# Patient Record
Sex: Female | Born: 1935 | Race: White | Hispanic: No | Smoking: Former smoker
Health system: Southern US, Community
[De-identification: ages and names within clinical notes are randomized; demographics above are authoritative.]

## PROBLEM LIST (undated history)

## (undated) DIAGNOSIS — A048 Other specified bacterial intestinal infections: Secondary | ICD-10-CM

## (undated) DIAGNOSIS — H9190 Unspecified hearing loss, unspecified ear: Secondary | ICD-10-CM

## (undated) DIAGNOSIS — I10 Essential (primary) hypertension: Secondary | ICD-10-CM

## (undated) DIAGNOSIS — E039 Hypothyroidism, unspecified: Secondary | ICD-10-CM

## (undated) DIAGNOSIS — E785 Hyperlipidemia, unspecified: Secondary | ICD-10-CM

## (undated) HISTORY — PX: THYROIDECTOMY: SHX17

## (undated) HISTORY — DX: Hypothyroidism, unspecified: E03.9

## (undated) HISTORY — DX: Unspecified hearing loss, unspecified ear: H91.90

## (undated) HISTORY — DX: Other specified bacterial intestinal infections: A04.8

## (undated) HISTORY — PX: APPENDECTOMY: SHX54

## (undated) HISTORY — PX: CHOLECYSTECTOMY: SHX55

## (undated) HISTORY — DX: Essential (primary) hypertension: I10

## (undated) HISTORY — DX: Hyperlipidemia, unspecified: E78.5

---

## 2007-08-26 LAB — HM MAMMOGRAPHY: HM Mammogram: NORMAL

## 2008-03-27 ENCOUNTER — Ambulatory Visit: Payer: Self-pay | Admitting: Family Medicine

## 2008-03-27 DIAGNOSIS — E785 Hyperlipidemia, unspecified: Secondary | ICD-10-CM | POA: Insufficient documentation

## 2008-03-27 DIAGNOSIS — I10 Essential (primary) hypertension: Secondary | ICD-10-CM | POA: Insufficient documentation

## 2008-04-30 ENCOUNTER — Ambulatory Visit: Payer: Self-pay | Admitting: Family Medicine

## 2008-04-30 DIAGNOSIS — E039 Hypothyroidism, unspecified: Secondary | ICD-10-CM | POA: Insufficient documentation

## 2008-05-01 LAB — CONVERTED CEMR LAB
ALT: 15 units/L (ref 0–35)
AST: 16 units/L (ref 0–37)
Albumin: 4.4 g/dL (ref 3.5–5.2)
Alkaline Phosphatase: 83 units/L (ref 39–117)
Calcium: 10.2 mg/dL (ref 8.4–10.5)
Chloride: 99 meq/L (ref 96–112)
Potassium: 4.9 meq/L (ref 3.5–5.3)

## 2008-05-02 ENCOUNTER — Encounter: Payer: Self-pay | Admitting: Family Medicine

## 2008-05-03 LAB — CONVERTED CEMR LAB
HDL: 68 mg/dL (ref >39)
LDL Cholesterol: 72 mg/dL (ref 0–99)
VLDL: 22 mg/dL (ref 0–40)

## 2008-05-18 ENCOUNTER — Telehealth: Payer: Self-pay | Admitting: Family Medicine

## 2008-08-01 ENCOUNTER — Ambulatory Visit: Payer: Self-pay | Admitting: Family Medicine

## 2008-08-02 ENCOUNTER — Encounter: Payer: Self-pay | Admitting: Family Medicine

## 2008-08-02 LAB — CONVERTED CEMR LAB
CO2: 27 meq/L (ref 19–32)
Calcium: 9.7 mg/dL (ref 8.4–10.5)
Chloride: 101 meq/L (ref 96–112)
Sodium: 137 meq/L (ref 135–145)

## 2008-08-20 ENCOUNTER — Telehealth: Payer: Self-pay | Admitting: Family Medicine

## 2008-08-22 ENCOUNTER — Telehealth: Payer: Self-pay | Admitting: Family Medicine

## 2008-08-27 ENCOUNTER — Telehealth: Payer: Self-pay | Admitting: Family Medicine

## 2008-10-08 ENCOUNTER — Encounter: Payer: Self-pay | Admitting: Family Medicine

## 2008-10-08 ENCOUNTER — Telehealth (INDEPENDENT_AMBULATORY_CARE_PROVIDER_SITE_OTHER): Payer: Self-pay | Admitting: *Deleted

## 2008-10-09 LAB — CONVERTED CEMR LAB: TSH: 2.602 u[IU]/mL

## 2009-02-05 ENCOUNTER — Ambulatory Visit: Payer: Self-pay | Admitting: Family Medicine

## 2009-02-05 DIAGNOSIS — F438 Other reactions to severe stress: Secondary | ICD-10-CM

## 2009-02-06 LAB — CONVERTED CEMR LAB
AST: 19 units/L (ref 0–37)
Albumin: 4.4 g/dL (ref 3.5–5.2)
Alkaline Phosphatase: 85 units/L (ref 39–117)
BUN: 16 mg/dL (ref 6–23)
Calcium: 10.2 mg/dL (ref 8.4–10.5)
Chloride: 98 meq/L (ref 96–112)
Cholesterol, target level: 200 mg/dL
HDL goal, serum: 40 mg/dL
HDL: 61 mg/dL (ref >39)
LDL Cholesterol: 133 mg/dL — ABNORMAL HIGH (ref 0–99)
LDL Goal: 160 mg/dL
Potassium: 4.5 meq/L (ref 3.5–5.3)
Sodium: 135 meq/L (ref 135–145)
TSH: 1.344 microintl units/mL (ref 0.350–4.500)
Total Protein: 7.2 g/dL (ref 6.0–8.3)

## 2009-07-08 ENCOUNTER — Ambulatory Visit: Payer: Self-pay | Admitting: Family Medicine

## 2009-07-09 LAB — CONVERTED CEMR LAB
ALT: 17 units/L (ref 0–35)
AST: 16 units/L (ref 0–37)
Alkaline Phosphatase: 95 units/L (ref 39–117)
CO2: 25 meq/L (ref 19–32)
Creatinine, Ser: 0.86 mg/dL (ref 0.40–1.20)
Sodium: 138 meq/L (ref 135–145)
TSH: 4.908 microintl units/mL — ABNORMAL HIGH (ref 0.350–4.500)
Total Bilirubin: 0.9 mg/dL (ref 0.3–1.2)
Total Protein: 7.2 g/dL (ref 6.0–8.3)

## 2010-01-06 ENCOUNTER — Ambulatory Visit: Payer: Self-pay | Admitting: Family Medicine

## 2010-01-07 LAB — CONVERTED CEMR LAB
CO2: 24 meq/L (ref 19–32)
Calcium: 10.3 mg/dL (ref 8.4–10.5)
Chloride: 100 meq/L (ref 96–112)
Creatinine, Ser: 0.96 mg/dL (ref 0.40–1.20)
Glucose, Bld: 94 mg/dL (ref 70–99)

## 2010-07-08 ENCOUNTER — Ambulatory Visit: Payer: Self-pay | Admitting: Family Medicine

## 2010-07-09 LAB — CONVERTED CEMR LAB
AST: 19 units/L (ref 0–37)
Albumin: 4.5 g/dL (ref 3.5–5.2)
Alkaline Phosphatase: 82 units/L (ref 39–117)
BUN: 19 mg/dL (ref 6–23)
Calcium: 10 mg/dL (ref 8.4–10.5)
Creatinine, Ser: 0.85 mg/dL (ref 0.40–1.20)
Glucose, Bld: 100 mg/dL — ABNORMAL HIGH (ref 70–99)
HDL: 56 mg/dL (ref >39)
LDL Cholesterol: 129 mg/dL — ABNORMAL HIGH (ref 0–99)
Potassium: 4.3 meq/L (ref 3.5–5.3)
Total CHOL/HDL Ratio: 3.9
Triglycerides: 179 mg/dL — ABNORMAL HIGH (ref <150)

## 2010-09-02 NOTE — Assessment & Plan Note (Signed)
Summary: f/u HTN/ thyroid   Vital Signs:  Patient profile:   77 year old female Height:      59 inches Weight:      124 pounds BMI:     25.14 O2 Sat:      100 % on Room air Pulse rate:   60 / minute BP sitting:   130 / 70  (right arm) Cuff size:   regular  Vitals Entered By: Payton Spark CMA (January 06, 2010 9:33 AM)  O2 Flow:  Room air CC: F/U HTN and TSH   Primary Care Provider:  Nani Gasser MD  CC:  F/U HTN and TSH.  History of Present Illness: 77 yo WF pt of Dr Linford Arnold seen today for f/u HTN and hypothyroidism.  She is overdue for her TSH as her thyroid med was increased 6 mos ago and she failed to have a 2 month TSH checked.   She denies fatigue, leg edema or constipation.  She denies CP or DOE.  She is due for RFs today.  Current Medications (verified): 1)  Lisinopril-Hydrochlorothiazide 20-25 Mg Tabs (Lisinopril-Hydrochlorothiazide) .... Take 1 Tablet By Mouth Once A Day 2)  Micro-K 10 Meq Cr-Caps (Potassium Chloride) .... Take 1 Tablet By Mouth Once A Day 3)  Levothroid 88 Mcg Tabs (Levothyroxine Sodium) .... Take 1 Tablet By Mouth Once A Day in The Am 4)  Amlodipine Besylate 5 Mg Tabs (Amlodipine Besylate) .... Take One Tablet By Mouth Once A Day 5)  Atenolol 100 Mg Tabs (Atenolol) .... Take One and Half Tablet By Mouth Once A Day 6)  Alprazolam 1 Mg Tabs (Alprazolam) .... Take 1-2 Tablet By Mouth Once A Day As Needed For Anxiety 7)  Ranitidine Hcl 150 Mg Tabs (Ranitidine Hcl) .... Take 1 Tab By Mouth By Mouth Two Times A Day  Allergies (verified): 1)  ! Simvastatin  Past History:  Past Medical History: Reviewed history from 08/01/2008 and no changes required. Current Problems:  HYPOTHYROIDISM (ICD-244.9) HYPERLIPIDEMIA (ICD-272.4) HYPERTENSION, BENIGN (ICD-401.1)  Hx of H.pylori - treated.  Hard of hearing.   Past Surgical History: Reviewed history from 04/30/2008 and no changes required. Thyroidectomy, parital for goiter.   Social  History: Reviewed history from 03/27/2008 and no changes required. Housewife. Widowed.  Lives alone.  Former Smoker Alcohol use-no Drug use-no Regular exercise-yes  Review of Systems      See HPI  Physical Exam  General:  alert, well-developed, well-nourished, and well-hydrated.   Head:  normocephalic and atraumatic.   Eyes:  bilat clouding of the anterior chambers Mouth:  pharynx pink and moist and poor dentition.   Neck:  no masses.  no carotid bruits Lungs:  Normal respiratory effort, chest expands symmetrically. Lungs are clear to auscultation, no crackles or wheezes. Heart:  Normal rate and regular rhythm. S1 and S2 normal without gallop, murmur, click, rub or other extra sounds. Extremities:  no LE edema Skin:  color normal.   Psych:  good eye contact, not anxious appearing, and not depressed appearing.     Impression & Recommendations:  Problem # 1:  HYPOTHYROIDISM (ICD-244.9) Recheck TSH today.  Adjust medication tomorrow and will [facility] Her updated medication list for this problem includes:    Levothroid 88 Mcg Tabs (Levothyroxine sodium) .Marland Kitchen... Take 1 tablet by mouth once a day in the am  Labs Reviewed: TSH: 4.908 (07/08/2009)    Chol: 224 (02/05/2009)   HDL: 61 (02/05/2009)   LDL: 133 (02/05/2009)   TG:  149 (02/05/2009)  Problem # 2:  HYPERTENSION, BENIGN (ICD-401.1) AT goal on current meds.  Due only for BMP today.  RFs sent. Her updated medication list for this problem includes:    Lisinopril-hydrochlorothiazide 20-25 Mg Tabs (Lisinopril-hydrochlorothiazide) .Marland Kitchen... Take 1 tablet by mouth once a day    Amlodipine Besylate 5 Mg Tabs (Amlodipine besylate) .Marland Kitchen... Take one tablet by mouth once a day    Atenolol 100 Mg Tabs (Atenolol) .Marland Kitchen... Take one and half tablet by mouth once a day  Orders: T-Basic Metabolic Panel [phone])  BP today: 130/70 Prior BP: 125/70 (07/08/2009)  Prior 10 Yr Risk Heart Disease: 9 % (02/06/2009)  Labs Reviewed: K+:  4.6 (07/08/2009) Creat: : 0.86 (07/08/2009)   Chol: 224 (02/05/2009)   HDL: 61 (02/05/2009)   LDL: 133 (02/05/2009)   TG: 149 (02/05/2009)  Complete Medication List: 1)  Lisinopril-hydrochlorothiazide 20-25 Mg Tabs (Lisinopril-hydrochlorothiazide) .... Take 1 tablet by mouth once a day 2)  Micro-k 10 Meq Cr-caps (Potassium chloride) .... Take 1 tablet by mouth once a day 3)  Levothroid 88 Mcg Tabs (Levothyroxine sodium) .... Take 1 tablet by mouth once a day in the am 4)  Amlodipine Besylate 5 Mg Tabs (Amlodipine besylate) .... Take one tablet by mouth once a day 5)  Atenolol 100 Mg Tabs (Atenolol) .... Take one and half tablet by mouth once a day 6)  Alprazolam 1 Mg Tabs (Alprazolam) .... Take 1-2 tablet by mouth once a day as needed for anxiety 7)  Ranitidine Hcl 150 Mg Tabs (Ranitidine hcl) .... Take 1 tab by mouth by mouth two times a day  Other Orders: T-TSH ([id number])  Patient Instructions: 1)  Meds RFd below. 2)  Will call you w/ lab results tomorrow and I will fill your THYROID medicine tomorrow. 3)  BP looks good. 4)  Return to see Dr Linford Arnold in 6 mos, sooner if needed. Prescriptions: LISINOPRIL-HYDROCHLOROTHIAZIDE 20-25 MG TABS (LISINOPRIL-HYDROCHLOROTHIAZIDE) Take 1 tablet by mouth once a day  #90 Each x 2   Entered and Authorized by:   Seymour Bars DO   Signed by:   Seymour Bars DO on 01/06/2010   Method used:   Electronically to        [facility]* (mail-order)             ,          Ph: [phone]       Fax: [phone]   RxID:   [id number] RANITIDINE HCL 150 MG TABS (RANITIDINE HCL) Take 1 tab by mouth by mouth two times a day  #180 x 1   Entered by:   Payton Spark CMA   Authorized by:   Seymour Bars DO   Signed by:   Seymour Bars DO on 01/06/2010   Method used:   Electronically to        [facility]* (mail-order)             ,          Ph: [phone]       Fax: [phone]   RxID:   [id number] MICRO-K 10 MEQ CR-CAPS (POTASSIUM CHLORIDE)  Take 1 tablet by mouth once a day  #90 x 2   Entered by:   Payton Spark CMA   Authorized by:   Seymour Bars DO   Signed by:   Seymour Bars DO on 01/06/2010   Method used:   Electronically to        [facility]* (mail-order)             ,  Ph: [phone]       Fax: [phone]   RxID:   [id number] ATENOLOL 100 MG TABS (ATENOLOL) Take one and half tablet by mouth once a day  #135 x 2   Entered by:   Payton Spark CMA   Authorized by:   Seymour Bars DO   Signed by:   Seymour Bars DO on 01/06/2010   Method used:   Electronically to        [facility]* (mail-order)             ,          Ph: [phone]       Fax: [phone]   RxID:   [id number] AMLODIPINE BESYLATE 5 MG TABS (AMLODIPINE BESYLATE) Take one tablet by mouth once a day  #90 x 2   Entered by:   Payton Spark CMA   Authorized by:   Seymour Bars DO   Signed by:   Seymour Bars DO on 01/06/2010   Method used:   Electronically to        [facility]* (mail-order)             ,          Ph: [phone]       Fax: [phone]   RxID:   [id number]

## 2010-09-02 NOTE — Assessment & Plan Note (Signed)
Summary: 6 MONTH FU hypertension, thyroid   Vital Signs:  Patient profile:   77 year old female Height:      59 inches Weight:      123 pounds Pulse rate:   70 / minute BP sitting:   150 / 76  (right arm) Cuff size:   regular  Vitals Entered By: Avon Gully CMA, Duncan Dull) (July 08, 2010 8:47 AM)  Serial Vital Signs/Assessments:  Time      Position  BP       Pulse  Resp  Temp     By 9:30 AM             132/71                         Avon Gully CMA, (AAMA)  CC: f/u BP and thyroid, Hypertension Management   Primary Care Provider:  Nani Gasser MD  CC:  f/u BP and thyroid and Hypertension Management.  History of Present Illness: Daughter died 3 week ago from cancer. She feels she is dealing with it OK. She declined therapy/counseling services.  She is here to f/u her BP and recheck her thyrod. This is her 6 mof/u.   Hypertension History:      She denies headache, chest pain, palpitations, dyspnea with exertion, orthopnea, PND, peripheral edema, visual symptoms, neurologic problems, syncope, and side effects from treatment.  She notes no problems with any antihypertensive medication side effects.        Positive major cardiovascular risk factors include female age 69 years old or older, hyperlipidemia, and hypertension.  Negative major cardiovascular risk factors include no history of diabetes, negative family history for ischemic heart disease, and non-tobacco-user status.        Further assessment for target organ damage reveals no history of ASHD, stroke/TIA, or peripheral vascular disease.     Current Medications (verified): 1)  Lisinopril-Hydrochlorothiazide 20-25 Mg Tabs (Lisinopril-Hydrochlorothiazide) .... Take 1 Tablet By Mouth Once A Day 2)  Micro-K 10 Meq Cr-Caps (Potassium Chloride) .... Take 1 Tablet By Mouth Once A Day 3)  Levothroid 88 Mcg Tabs (Levothyroxine Sodium) .... Take 1 Tablet By Mouth Once A Day in The Am 4)  Amlodipine Besylate 5 Mg Tabs  (Amlodipine Besylate) .... Take One Tablet By Mouth Once A Day 5)  Atenolol 100 Mg Tabs (Atenolol) .... Take One and Half Tablet By Mouth Once A Day 6)  Alprazolam 1 Mg Tabs (Alprazolam) .... Take 1-2 Tablet By Mouth Once A Day As Needed For Anxiety 7)  Ranitidine Hcl 150 Mg Tabs (Ranitidine Hcl) .... Take 1 Tab By Mouth By Mouth Two Times A Day  Allergies (verified): 1)  ! Simvastatin  Comments:  Nurse/Medical Assistant: The patient's medications and allergies were reviewed with the patient and were updated in the Medication and Allergy Lists. Avon Gully CMA, Duncan Dull) (July 08, 2010 8:47 AM)  Physical Exam  General:  Well-developed,well-nourished,in no acute distress; alert,appropriate and cooperative throughout examination Head:  Normocephalic and atraumatic without obvious abnormalities. No apparent alopecia or balding. Eyes:  No corneal or conjunctival inflammation noted. EOMI. Perrla.  Ears:  External ear exam shows no significant lesions or deformities.  Otoscopic examination reveals clear canals, tympanic membranes are intact bilaterally without bulging, retraction, inflammation or discharge. Hearing is grossly normal bilaterally. Nose:  External nasal examination shows no deformity or inflammation. Nasal mucosa are pink and moist without lesions or exudates. Mouth:  Oral mucosa and oropharynx without  lesions or exudates.  Teeth in good repair. Neck:  No deformities, masses, or tenderness noted. Lungs:  Normal respiratory effort, chest expands symmetrically. Lungs are clear to auscultation, no crackles or wheezes. Heart:  Normal rate and regular rhythm. S1 and S2 normal without gallop, murmur, click, rub or other extra sounds. No carotid or abdominal bruits.  Skin:  no rashes.   Cervical Nodes:  No lymphadenopathy noted Psych:  Cognition and judgment appear intact. Alert and cooperative with normal attention span and concentration. No apparent delusions, illusions,  hallucinations   Impression & Recommendations:  Problem # 1:  HYPERTENSION, BENIGN (ICD-401.1) Up some today. She feels it is stress related since her daughter died 3 weeks ago from cancer that she had been battling for 3 years. Her recheck was at goal.  F/U in 6 months. Will send refils on her medication.  Her updated medication list for this problem includes:    Lisinopril-hydrochlorothiazide 20-25 Mg Tabs (Lisinopril-hydrochlorothiazide) .Marland Kitchen... Take 1 tablet by mouth once a day    Amlodipine Besylate 5 Mg Tabs (Amlodipine besylate) .Marland Kitchen... Take one tablet by mouth once a day    Atenolol 100 Mg Tabs (Atenolol) .Marland Kitchen... Take one and half tablet by mouth once a day  Orders: T-Comprehensive Metabolic Panel ([id number])  Problem # 2:  HYPOTHYROIDISM (ICD-244.9) She has noticed some excess weight loss but not sure if form depression for from her thyroid.   Her updated medication list for this problem includes:    Levothroid 88 Mcg Tabs (Levothyroxine sodium) .Marland Kitchen... Take 1 tablet by mouth once a day in the am  Orders: T-TSH ([id number])  Complete Medication List: 1)  Lisinopril-hydrochlorothiazide 20-25 Mg Tabs (Lisinopril-hydrochlorothiazide) .... Take 1 tablet by mouth once a day 2)  Micro-k 10 Meq Cr-caps (Potassium chloride) .... Take 1 tablet by mouth once a day 3)  Levothroid 88 Mcg Tabs (Levothyroxine sodium) .... Take 1 tablet by mouth once a day in the am 4)  Amlodipine Besylate 5 Mg Tabs (Amlodipine besylate) .... Take one tablet by mouth once a day 5)  Atenolol 100 Mg Tabs (Atenolol) .... Take one and half tablet by mouth once a day 6)  Alprazolam 1 Mg Tabs (Alprazolam) .... Take 1-2 tablet by mouth once a day as needed for anxiety 7)  Ranitidine Hcl 150 Mg Tabs (Ranitidine hcl) .... Take 1 tab by mouth by mouth two times a day  Other Orders: T-Lipid Profile ([id number]) Influenza Vaccine MCR (47425)  Hypertension Assessment/Plan:      The patient's hypertensive risk group is  category B: At least one risk factor (excluding diabetes) with no target organ damage.  Her calculated 10 year risk of coronary heart disease is 9 %.  Today's blood pressure is 150/76.  Her blood pressure goal is < 140/90.  Patient Instructions: 1)  Can schedule an annual wellness exam in 6 months .  2)  We will call you for your lab results.  Prescriptions: RANITIDINE HCL 150 MG TABS (RANITIDINE HCL) Take 1 tab by mouth by mouth two times a day  #180 x 2   Entered and Authorized by:   Nani Gasser MD   Signed by:   Nani Gasser MD on 07/08/2010   Method used:   Electronically to        [facility]* (retail)             ,          Ph: [phone]       Fax: [phone]  RxID:   [id number] ATENOLOL 100 MG TABS (ATENOLOL) Take one and half tablet by mouth once a day  #135 x 2   Entered and Authorized by:   Nani Gasser MD   Signed by:   Nani Gasser MD on 07/08/2010   Method used:   Electronically to        [facility]* (retail)             ,          Ph: [phone]       Fax: [phone]   RxID:   [id number] AMLODIPINE BESYLATE 5 MG TABS (AMLODIPINE BESYLATE) Take one tablet by mouth once a day  #90 x 2   Entered and Authorized by:   Nani Gasser MD   Signed by:   Nani Gasser MD on 07/08/2010   Method used:   Electronically to        [facility]* (retail)             ,          Ph: [phone]       Fax: [phone]   RxID:   [id number] MICRO-K 10 MEQ CR-CAPS (POTASSIUM CHLORIDE) Take 1 tablet by mouth once a day  #90 x 2   Entered and Authorized by:   Nani Gasser MD   Signed by:   Nani Gasser MD on 07/08/2010   Method used:   Electronically to        [facility]* (retail)             ,          Ph: [phone]       Fax: [phone]   RxID:   [id number] LISINOPRIL-HYDROCHLOROTHIAZIDE 20-25 MG TABS (LISINOPRIL-HYDROCHLOROTHIAZIDE) Take 1 tablet by mouth once a day  #90 Each x 2    Entered and Authorized by:   Nani Gasser MD   Signed by:   Nani Gasser MD on 07/08/2010   Method used:   Electronically to        [facility]* (retail)             ,          Ph: [phone]       Fax: [phone]   RxID:   [id number]    Orders Added: 1)  T-Comprehensive Metabolic Panel [[id number]] 2)  T-Lipid Profile [[id number]] 3)  T-TSH [[id number]] 4)  Influenza Vaccine MCR [00025] 5)  Est. Patient Level III [97673]   Immunizations Administered:  Influenza Vaccine # 1:    Vaccine Type: Fluvax MCR    Site: right deltoid    Mfr: GlaxoSmithKline    Dose: 0.5 ml    Route: IM    Given by: Sue Lush McCrimmon CMA, (AAMA)    Exp. Date: 01/31/2011    Lot #: [id number]    VIS given: 02/25/10 version given July 08, 2010.  Flu Vaccine Consent Questions:    Do you have a history of severe allergic reactions to this vaccine? no    Any prior history of allergic reactions to egg and/or gelatin? no    Do you have a sensitivity to the preservative Thimersol? no    Do you have a past history of Guillan-Barre Syndrome? no    Do you currently have an acute febrile illness? no    Have you ever had a severe reaction to latex? no    Vaccine information given and explained to patient? no    Are  you currently pregnant? no   Immunizations Administered:  Influenza Vaccine # 1:    Vaccine Type: Fluvax MCR    Site: right deltoid    Mfr: GlaxoSmithKline    Dose: 0.5 ml    Route: IM    Given by: Sue Lush McCrimmon CMA, (AAMA)    Exp. Date: 01/31/2011    Lot #: [id number]    VIS given: 02/25/10 version given July 08, 2010.

## 2010-09-11 ENCOUNTER — Telehealth: Payer: Self-pay | Admitting: Family Medicine

## 2010-09-18 NOTE — Progress Notes (Signed)
Summary: [facility]-Levothroid rx  Phone Note Call from Patient Call back at [facility] Phone [phone]   Summary of Call: levothroid not at pharmacy per patient.  I spoke with pharmacy and they have rx on hold as pt did not pick up in December.  They will fill for pt and have ready today. Initial call taken by: Francee Piccolo CMA Duncan Dull),  September 11, 2010 11:58 AM     Appended Document: [facility]-Levothroid rx [facility] from pt, meds need to be sent to [facility] not local pharmacy.  RX sent to [facility].  Pt notified.  Rx cancelled at [facility] per Cesar Chavez.  Francee Piccolo CMA Duncan Dull)  September 11, 2010 3:58 PM  Clinical Lists Changes  Prescriptions: LEVOTHROID 37 MCG TABS (LEVOTHYROXINE SODIUM) Take 1 tablet by mouth once a day in the AM  #90 x 1   Entered by:   Francee Piccolo CMA (AAMA)   Authorized by:   Nani Gasser MD   Signed by:   Francee Piccolo CMA (AAMA) on 09/11/2010   Method used:   Electronically to        [facility]* (retail)             ,          Ph: [phone]       Fax: [phone]   RxID:   [id number]

## 2011-01-04 ENCOUNTER — Encounter: Payer: Self-pay | Admitting: Family Medicine

## 2011-01-08 ENCOUNTER — Other Ambulatory Visit: Payer: Self-pay | Admitting: Family Medicine

## 2011-01-08 ENCOUNTER — Ambulatory Visit (INDEPENDENT_AMBULATORY_CARE_PROVIDER_SITE_OTHER): Payer: Medicare Other | Admitting: Family Medicine

## 2011-01-08 ENCOUNTER — Encounter: Payer: Self-pay | Admitting: Family Medicine

## 2011-01-08 DIAGNOSIS — Z136 Encounter for screening for cardiovascular disorders: Secondary | ICD-10-CM

## 2011-01-08 DIAGNOSIS — Z1231 Encounter for screening mammogram for malignant neoplasm of breast: Secondary | ICD-10-CM

## 2011-01-08 DIAGNOSIS — I10 Essential (primary) hypertension: Secondary | ICD-10-CM

## 2011-01-08 DIAGNOSIS — Z780 Asymptomatic menopausal state: Secondary | ICD-10-CM

## 2011-01-08 DIAGNOSIS — E785 Hyperlipidemia, unspecified: Secondary | ICD-10-CM

## 2011-01-08 DIAGNOSIS — E039 Hypothyroidism, unspecified: Secondary | ICD-10-CM

## 2011-01-08 DIAGNOSIS — Z0000 Encounter for general adult medical examination without abnormal findings: Secondary | ICD-10-CM

## 2011-01-08 DIAGNOSIS — R9431 Abnormal electrocardiogram [ECG] [EKG]: Secondary | ICD-10-CM

## 2011-01-08 LAB — LIPID PANEL
HDL: 62 mg/dL (ref >39)
Total CHOL/HDL Ratio: 3.7 Ratio
VLDL: 22 mg/dL (ref 0–40)

## 2011-01-08 LAB — HEPATIC FUNCTION PANEL
Alkaline Phosphatase: 90 U/L (ref 39–117)
Bilirubin, Direct: 0.2 mg/dL (ref 0.0–0.3)
Indirect Bilirubin: 1 mg/dL — ABNORMAL HIGH (ref 0.0–0.9)
Total Bilirubin: 1.2 mg/dL (ref 0.3–1.2)
Total Protein: 7.3 g/dL (ref 6.0–8.3)

## 2011-01-08 LAB — TSH: TSH: 2.52 u[IU]/mL (ref 0.350–4.500)

## 2011-01-08 MED ORDER — AMBULATORY NON FORMULARY MEDICATION: Status: DC

## 2011-01-08 MED ORDER — LEVOTHYROXINE SODIUM 88 MCG PO TABS: 88.0000 ug | ORAL_TABLET | Freq: Every day | ORAL | Status: DC

## 2011-01-08 MED ORDER — AMLODIPINE BESYLATE 5 MG PO TABS: 5.0000 mg | ORAL_TABLET | Freq: Every day | ORAL | Status: DC

## 2011-01-08 MED ORDER — LISINOPRIL-HYDROCHLOROTHIAZIDE 20-25 MG PO TABS: 1.0000 | ORAL_TABLET | Freq: Every day | ORAL | Status: DC

## 2011-01-08 MED ORDER — RANITIDINE HCL 150 MG PO TABS: 150.0000 mg | ORAL_TABLET | Freq: Two times a day (BID) | ORAL | Status: DC

## 2011-01-08 MED ORDER — ATENOLOL 100 MG PO TABS: 100.0000 mg | ORAL_TABLET | Freq: Every day | ORAL | Status: DC

## 2011-01-08 MED ORDER — POTASSIUM CHLORIDE ER 10 MEQ PO CPCR: 10.0000 meq | ORAL_CAPSULE | Freq: Every day | ORAL | Status: DC

## 2011-01-08 NOTE — Progress Notes (Signed)
Subjective:    Tina Graham is a 77 y.o. female who presents for Medicare Annual/Subsequent preventive examination.  Preventive Screening-Counseling & Management  Tobacco History  Smoking status  . Former Smoker  Smokeless tobacco  . Not on file     Problems Prior to Visit 1.   Current Problems (verified) Patient Active Problem List  Diagnoses  . HYPOTHYROIDISM  . HYPERLIPIDEMIA  . OTHER ACUTE REACTIONS TO STRESS  . HYPERTENSION, BENIGN    Medications Prior to Visit Current Outpatient Prescriptions on File Prior to Visit  Medication Sig Dispense Refill  . ALPRAZolam (XANAX) 1 MG tablet Take 1 mg by mouth at bedtime as needed.        Marland Kitchen amLODipine (NORVASC) 5 MG tablet Take 5 mg by mouth daily.        Marland Kitchen atenolol (TENORMIN) 100 MG tablet Take by mouth. Take one and half tablets by mouth once daily.       Marland Kitchen levothyroxine (LEVOTHROID) 88 MCG tablet Take 88 mcg by mouth daily.        Marland Kitchen lisinopril-hydrochlorothiazide (PRINZIDE,ZESTORETIC) 20-25 MG per tablet Take 1 tablet by mouth daily.        . Omega-3 Fatty Acids (FISH OIL) 1000 MG CAPS Take 2,000 mg by mouth daily.        . potassium chloride (MICRO-K) 10 MEQ CR capsule Take 10 mEq by mouth daily.        . Ranitidine & Diet Manage Prod (RANITIDINE-NUTRITIONAL SUPL) 150 MG MISC Take 150 mg by mouth 2 (two) times daily.          Current Medications (verified) Current Outpatient Prescriptions  Medication Sig Dispense Refill  . ALPRAZolam (XANAX) 1 MG tablet Take 1 mg by mouth at bedtime as needed.        Marland Kitchen amLODipine (NORVASC) 5 MG tablet Take 5 mg by mouth daily.        Marland Kitchen atenolol (TENORMIN) 100 MG tablet Take by mouth. Take one and half tablets by mouth once daily.       Marland Kitchen levothyroxine (LEVOTHROID) 88 MCG tablet Take 88 mcg by mouth daily.        Marland Kitchen lisinopril-hydrochlorothiazide (PRINZIDE,ZESTORETIC) 20-25 MG per tablet Take 1 tablet by mouth daily.        . Omega-3 Fatty Acids (FISH OIL) 1000 MG CAPS Take 2,000 mg by  mouth daily.        . potassium chloride (MICRO-K) 10 MEQ CR capsule Take 10 mEq by mouth daily.        . Ranitidine & Diet Manage Prod (RANITIDINE-NUTRITIONAL SUPL) 150 MG MISC Take 150 mg by mouth 2 (two) times daily.           Allergies (verified) Simvastatin   PAST HISTORY  Family History Family History  Problem Relation Age of Onset  . Cancer Daughter     Social History History  Substance Use Topics  . Smoking status: Former Games developer  . Smokeless tobacco: Not on file  . Alcohol Use: No     Are there smokers in your home (other than you)? No  Risk Factors Current exercise habits: Home exercise routine includes walking 30 minutes hrs per days.  Dietary issues discussed: fish, chx, veggies, fruit.    Cardiac risk factors: advanced age (older than 48 for men, 49 for women). HTN, hyperlipidema.   Depression Screen (Note: if answer to either of the following is "Yes", a more complete depression screening is indicated)   Over the past two weeks,  have you felt down, depressed or hopeless? No  Over the past two weeks, have you felt little interest or pleasure in doing things? No  Have you lost interest or pleasure in daily life? No  Do you often feel hopeless? No  Do you cry easily over simple problems? No  Activities of Daily Living In your present state of health, do you have any difficulty performing the following activities?:  Driving? Yes Managing money?  Yes Feeding yourself? Yes Getting from bed to chair? Timoteo Ace gets up on exam table.  Climbing a flight of stairs? Yes Preparing food and eating?: Yes Bathing or showering? Yes Getting dressed: Yes Getting to the toilet? Yes Using the toilet:Yes Moving around from place to place: Yes In the past year have you fallen or had a near fall?:Yes   Are you sexually active?  No  Do you have more than one partner?  No  Hearing Difficulties: Yes Do you often ask people to speak up or repeat themselves? Yes Do you  experience ringing or noises in your ears? Yes Do you have difficulty understanding soft or whispered voices? Yes   Do you feel that you have a problem with memory? Yes  Do you often misplace items? No  Do you feel safe at home?  Yes  Cognitive Testing  Alert? Yes  Normal Appearance?Yes  Oriented to person? Yes  Place? Yes   Time? Yes  Recall of three objects?  Yes  Can perform simple calculations? Yes  Displays appropriate judgment?Yes  Can read the correct time from a watch face?Yes   Advanced Directives have been discussed with the patient? Yes  List the Names of Other Physician/Practitioners you currently use: 1.    Indicate any recent Medical Services you may have received from other than [facility] providers in the past year (date may be approximate).  Immunization History  Administered Date(s) Administered  . Influenza Whole 04/30/2008, 07/08/2009, 07/08/2010  . Pneumococcal Polysaccharide 08/03/2005  . Td 04/17/2005    Screening Tests Health Maintenance  Topic Date Due  . Colonoscopy  10/09/1980  . Zostavax  10/10/1990  . Influenza Vaccine  05/04/2011  . Tetanus/tdap  04/18/2015  . Pneumococcal Polysaccharide Vaccine Age 69 And Over  Completed    All answers were reviewed with the patient and necessary referrals were made:  Vona Whiters, MD   01/08/2011   History reviewed: allergies, current medications, past family history, past medical history, past social history and past surgical history  Review of Systems Pertinent items are noted in HPI.    Objective:     Vision by Snellen chart: right eye:20/70, left eye:20/70  Body mass index is 23.83 kg/(m^2). BP 123/76  Pulse 62  Ht 5' (1.524 m)  Wt 122 lb (55.339 kg)  BMI 23.83 kg/m2  BP 123/76  Pulse 62  Ht 5' (1.524 m)  Wt 122 lb (55.339 kg)  BMI 23.83 kg/m2  General Appearance:    Alert, cooperative, no distress, appears stated age  Head:    Normocephalic, without obvious abnormality, atraumatic    Eyes:    PERRL, conjunctiva/corneas clear, EOM's intact    , both eyes  Ears:    Normal TM's and external ear canals, both ears  Nose:   Nares normal, septum midline, mucosa normal, no drainage    or sinus tenderness  Throat:   Lips, mucosa, and tongue normal; gums normal  Neck:   Supple, symmetrical, trachea midline, no adenopathy;    thyroid:  no enlargement/tenderness/nodules; no  carotid   bruit  Back:     Symmetric, mild kyphosis, ROM normal, no CVA tenderness  Lungs:     Clear to auscultation bilaterally, respirations unlabored  Chest Wall:    No tenderness or deformity   Heart:    Regular rate and rhythm, S1 and S2 normal, no murmur, rub   or gallop  Breast Exam:    No tenderness, masses, or nipple abnormality  Abdomen:     Soft, non-tender, bowel sounds active all four quadrants,    no masses, no organomegaly  Genitalia:    N/A  Rectal:    N/A  Extremities:   Extremities normal, atraumatic, no cyanosis or edema  Pulses:   2+ and symmetric all extremities  Skin:   Skin color, texture, turgor normal, no rashes or lesions  Lymph nodes:   Cervical, supraclavicular, and axillary nodes normal  Neurologic:   CNII-XII intact, normal strength, reflexes    throughout       Assessment:    Healthy 77 yo WF.  Inc cardiac risk so EKG performed today/ EKG shows borderline bradycardia at rate of 58.   Patient Active Problem List  Diagnoses  . HYPOTHYROIDISM  . HYPERLIPIDEMIA  . OTHER ACUTE REACTIONS TO STRESS  . HYPERTENSION, BENIGN       Plan:     During the course of the visit the patient was educated and counseled about appropriate screening and preventive services including:    Screening mammography  Bone densitometry screening  Diabetes screening  Shingles vaccine  Recommend she make an appt with her eye doctor who she has not seen in one year.   Abnormal EKG. I looked at in my old records and could not find an old EKG to compare to. 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 I think I will schedule her with cardiology for further evaluation she is not currently having any chest pain or shortness of breath.  Diet review for nutrition referral? Yes ____  Not Indicated ____X   Patient Instructions (the written plan) was given to the patient.  Medicare Attestation I have personally reviewed: The patient's medical and social history Their use of alcohol, tobacco or illicit drugs Their current medications and supplements The patient's functional ability including ADLs,fall risks, home safety risks, cognitive, and hearing and visual impairment Diet and physical activities Evidence for depression or mood disorders  The patient's weight, height, BMI, and visual acuity have been recorded in the chart.  I have made referrals, counseling, and provided education to the patient based on review of the above and I have provided the patient with a written personalized care plan for preventive services.     Wesleigh Markovic, MD   01/08/2011

## 2011-01-09 ENCOUNTER — Telehealth: Payer: Self-pay | Admitting: Family Medicine

## 2011-01-09 LAB — BASIC METABOLIC PANEL WITH GFR
BUN: 19 mg/dL (ref 6–23)
CO2: 27 mEq/L (ref 19–32)
Chloride: 100 mEq/L (ref 96–112)
GFR, Est Non African American: 50 mL/min — ABNORMAL LOW (ref >60)
Glucose, Bld: 101 mg/dL — ABNORMAL HIGH (ref 70–99)
Potassium: 4.7 mEq/L (ref 3.5–5.3)

## 2011-01-09 NOTE — Telephone Encounter (Signed)
Tried to call pt with lab results, but NA after several rings.  Will try again on Monday. [name], LPN [facility]

## 2011-01-09 NOTE — Telephone Encounter (Signed)
Call patient: Thyroid and liver are normal. Kidney function is slightly elevated from last time so like to recheck this in 6-8 weeks. Cholesterol is also up a little bit from last time. LDL is 145. Blood sugar is borderline as well but this is stable from last time. Continue work on exercise and diet and see if we can get the LDL back down a little bit like it was before. Let's recheck in 6 months.

## 2011-01-12 NOTE — Telephone Encounter (Signed)
Pt aware of the above  

## 2011-01-13 ENCOUNTER — Other Ambulatory Visit: Payer: Self-pay | Admitting: Family Medicine

## 2011-01-13 MED ORDER — ATENOLOL 100 MG PO TABS: 150.0000 mg | ORAL_TABLET | Freq: Every day | ORAL | Status: DC

## 2011-01-27 ENCOUNTER — Ambulatory Visit
Admission: RE | Admit: 2011-01-27 | Discharge: 2011-01-27 | Disposition: A | Discharge status: Home / Self Care | Payer: Medicare Other | Source: Ambulatory Visit | Attending: Family Medicine | Admitting: Family Medicine

## 2011-01-27 DIAGNOSIS — Z780 Asymptomatic menopausal state: Secondary | ICD-10-CM

## 2011-01-27 DIAGNOSIS — Z1231 Encounter for screening mammogram for malignant neoplasm of breast: Secondary | ICD-10-CM

## 2011-01-28 ENCOUNTER — Telehealth: Payer: Self-pay | Admitting: Family Medicine

## 2011-01-28 NOTE — Telephone Encounter (Signed)
Pls let pt know that her bone density test is + for osteoporosis (thin bones).   I'd recommend her setting up appt with Dr Linford Arnold in the next month to discuss treatment options.

## 2011-01-29 NOTE — Telephone Encounter (Signed)
Pt advised of results and rec. Asked about her mammy-advised pt they are still awaiting prior films for comparison. Pt to ck on.

## 2011-02-06 ENCOUNTER — Encounter: Payer: Self-pay | Admitting: Family Medicine

## 2011-02-06 ENCOUNTER — Ambulatory Visit (INDEPENDENT_AMBULATORY_CARE_PROVIDER_SITE_OTHER): Payer: Medicare Other | Admitting: Family Medicine

## 2011-02-06 DIAGNOSIS — J069 Acute upper respiratory infection, unspecified: Secondary | ICD-10-CM

## 2011-02-06 DIAGNOSIS — N289 Disorder of kidney and ureter, unspecified: Secondary | ICD-10-CM

## 2011-02-06 DIAGNOSIS — M810 Age-related osteoporosis without current pathological fracture: Secondary | ICD-10-CM

## 2011-02-06 MED ORDER — ALENDRONATE SODIUM 70 MG PO TABS: 70.0000 mg | ORAL_TABLET | ORAL | Status: DC

## 2011-02-06 MED ORDER — GUAIFENESIN-CODEINE 100-10 MG/5ML PO SYRP: 5.0000 mL | ORAL_SOLUTION | Freq: Three times a day (TID) | ORAL | Status: DC | PRN

## 2011-02-06 NOTE — Assessment & Plan Note (Addendum)
This is a new diagnosis and we discussed what it means. Will check her vitamin d level today to make sure that she is at goal. We also discussed the importance of getting adequate amount of calcium in her diet. I recommend a 1200 mg daily. We also discussed the importance of adding a bisphosphonate to help protect her from hip and spinal fractures. We will start alendronate. She can call she has any concerns or problems such as heartburn on the medication. Otherwise will repeat her bone density test in 2 years.

## 2011-02-06 NOTE — Progress Notes (Signed)
  Subjective:    Patient ID: Tina Graham, female    DOB: 06-13-1929, 77 y.o.   MRN: [id number]  HPI Cough for one week. Productive.  Occ nasal congesiton.  No ear pain or pressure. No fever.  No cough meds.  Would like a refill on cheratussin as well.  Some SOB with cough.  No ST.  new GI symptoms  Osteoporosis - Here to review her results of her bone density test. Her T score was -2.8..  Notes she had some pain in front of her thighs bilaterally for a few days and wonders if this could be related. She feels better today.  She does take a multivitamin which has 500 mg of calcium and 400 international units of vitamin D. She says she doesn't necessarily take it every day. I believe there is a positive family history of osteoporosis.   Review of Systems     Objective:   Physical Exam  Constitutional: She is oriented to person, place, and time. She appears well-developed and well-nourished.  HENT:  Head: Normocephalic and atraumatic.  Right Ear: External ear normal.  Left Ear: External ear normal.  Nose: Nose normal.  Mouth/Throat: Oropharynx is clear and moist.       TMs and canals are clear.   Eyes: Conjunctivae and EOM are normal. Pupils are equal, round, and reactive to light.  Neck: Neck supple. No thyromegaly present.  Cardiovascular: Normal rate, regular rhythm and normal heart sounds.   Pulmonary/Chest: Effort normal and breath sounds normal. She has no wheezes.  Lymphadenopathy:    She has no cervical adenopathy.  Neurological: She is alert and oriented to person, place, and time.  Skin: Skin is warm and dry.  Psychiatric: She has a normal mood and affect.          Assessment & Plan:  URI-I gave her prescription for cough medication. If she is not feeling better by Monday she can call the office and we'll consider a trial of antibiotic treatment for possible bronchitis. Otherwise symptomatic care is reasonable.  Abnormal renal function-4 and to recheck her kidney  function to make sure that is stable. She may have chronic kidney disease stage III.

## 2011-02-06 NOTE — Patient Instructions (Addendum)
Can take your multivitamin in the morning and a calcium600 (or 500mg ) with vitamin D in the evening Or if you don't take your multivitamin that day then take your calcium w/ vitamin D twice a day.

## 2011-02-07 LAB — BASIC METABOLIC PANEL WITH GFR
CO2: 25 mEq/L (ref 19–32)
Chloride: 99 mEq/L (ref 96–112)
GFR, Est Non African American: 57 mL/min — ABNORMAL LOW (ref >60)
Potassium: 4.4 mEq/L (ref 3.5–5.3)
Sodium: 136 mEq/L (ref 135–145)

## 2011-02-07 LAB — VITAMIN D 25 HYDROXY (VIT D DEFICIENCY, FRACTURES): Vit D, 25-Hydroxy: 31 ng/mL (ref 30–89)

## 2011-02-08 ENCOUNTER — Telehealth: Payer: Self-pay | Admitting: Family Medicine

## 2011-02-08 NOTE — Telephone Encounter (Signed)
Please call patient. Normal mammogram.  Repeat in 1 year.  

## 2011-02-09 ENCOUNTER — Telehealth: Payer: Self-pay | Admitting: Family Medicine

## 2011-02-09 NOTE — Telephone Encounter (Signed)
Advised pt of results and rec,

## 2011-02-09 NOTE — Telephone Encounter (Signed)
Call patient: Kidney function actually looks a little bit better than it did last time, therefore it is stable. We'll recheck her level in 6 months. Vitamin D is borderline low. I recommend that she start 800 international units once daily.

## 2011-02-09 NOTE — Telephone Encounter (Signed)
Advised pt of results.

## 2011-02-16 ENCOUNTER — Telehealth: Payer: Self-pay | Admitting: Family Medicine

## 2011-02-16 NOTE — Telephone Encounter (Signed)
Ok if doesn't want to take.  Make sure taking her calcium and vitamin D at least.

## 2011-02-16 NOTE — Telephone Encounter (Signed)
Pt called and is concerned about taking the fosomax 70 mg weekly.  Said she has read that this medication has all kind of side effects, and she doesn't want to take it.  She is currently taking some herbs that she has not mentioned to the provider such as:  Vitamin O, energy greens and vitamins, alka seltzer, tums, and ASA prn.  Please advise. Plan:  Routed to Dr. Marlyne Beards, LPN [facility]

## 2011-02-17 NOTE — Telephone Encounter (Signed)
Pt. Notified that if she doesn't want to take that it will be fine.  Pt now has decided she will take since she spoke with her granddaughter who is a Engineer, civil (consulting).  She already has the medication and will start the fosomax 70 mg weekly this upcoming Saturday.  Will call if she has any problems. [name], LPN [facility]

## 2011-02-25 ENCOUNTER — Encounter: Payer: Self-pay | Admitting: Cardiology

## 2011-02-25 ENCOUNTER — Ambulatory Visit (INDEPENDENT_AMBULATORY_CARE_PROVIDER_SITE_OTHER): Payer: Medicare Other | Admitting: Cardiology

## 2011-02-25 DIAGNOSIS — I10 Essential (primary) hypertension: Secondary | ICD-10-CM

## 2011-02-25 DIAGNOSIS — E785 Hyperlipidemia, unspecified: Secondary | ICD-10-CM

## 2011-02-25 DIAGNOSIS — R9431 Abnormal electrocardiogram [ECG] [EKG]: Secondary | ICD-10-CM | POA: Insufficient documentation

## 2011-02-25 NOTE — Progress Notes (Signed)
HPI: 77 year old female with no prior cardiac history for evaluation of abnormal electrocardiogram. Patient recently seen and noted to have a left anterior fascicular block. She denies dyspnea on exertion unless there is extreme activities. There is no orthopnea, PND, pedal edema, palpitations, syncope or chest pain. Because of her abnormal ECG we were asked to further evaluate.  Current Outpatient Prescriptions  Medication Sig Dispense Refill  . alendronate (FOSAMAX) 70 MG tablet Take 1 tablet (70 mg total) by mouth every 7 (seven) days. Take with a full glass of water on an empty stomach.  12 tablet  3  . ALPRAZolam (XANAX) 1 MG tablet Take 1 mg by mouth at bedtime as needed.        . AMBULATORY NON FORMULARY MEDICATION Medication Name: Zostavax singe dose IM x 1  1 vial  0  . amLODipine (NORVASC) 5 MG tablet Take 1 tablet (5 mg total) by mouth daily.  90 tablet  3  . atenolol (TENORMIN) 100 MG tablet Take 1.5 tablets (150 mg total) by mouth daily.  135 tablet  3  . guaiFENesin (ROBITUSSIN) 100 MG/5ML liquid Take 200 mg by mouth 3 (three) times daily as needed.        Marland Kitchen levothyroxine (LEVOTHROID) 88 MCG tablet Take 1 tablet (88 mcg total) by mouth daily.  90 tablet  0  . lisinopril-hydrochlorothiazide (PRINZIDE,ZESTORETIC) 20-25 MG per tablet Take 1 tablet by mouth daily.  90 tablet  3  . Multiple Vitamins-Minerals (CLINICAL NUTRIENTS FOR WOMEN) TABS Take by mouth.        . Omega-3 Fatty Acids (FISH OIL) 1000 MG CAPS Take 2,000 mg by mouth daily.        . potassium chloride (MICRO-K) 10 MEQ CR capsule Take 1 capsule (10 mEq total) by mouth daily.  90 capsule  3  . ranitidine (ZANTAC) 150 MG tablet Take 1 tablet (150 mg total) by mouth 2 (two) times daily.  180 tablet  3    Allergies  Allergen Reactions  . Simvastatin     REACTION: myalgias    Past Medical History  Diagnosis Date  . Hypothyroidism   . Hyperlipidemia   . Hypertension   . H. pylori infection     treated  . HOH (hard of  hearing)     Past Surgical History  Procedure Date  . Thyroidectomy     partial for goiter  . Cholecystectomy   . Appendectomy     History   Social History  . Marital Status: Widowed    Spouse Name: N/A    Number of Children: 5  . Years of Education: N/A   Occupational History  .     Social History Main Topics  . Smoking status: Former Games developer  . Smokeless tobacco: Not on file  . Alcohol Use: No  . Drug Use: No  . Sexually Active:      housewife, widowed, lives alone, regular exercise   Other Topics Concern  . Not on file   Social History Narrative  . No narrative on file    Family History  Problem Relation Age of Onset  . Cancer Daughter   . Heart disease Sister     unknown specifics    ROS: arthralgias but no fevers or chills, productive cough, hemoptysis, dysphasia, odynophagia, melena, hematochezia, dysuria, hematuria, rash, seizure activity, orthopnea, PND, pedal edema, claudication. Remaining systems are negative.  Physical Exam: General:  Well developed/well nourished in NAD Skin warm/dry Patient not depressed No peripheral clubbing Back-normal  HEENT-normal/normal eyelids Neck supple/normal carotid upstroke bilaterally; no bruits; no JVD; no thyromegaly chest - CTA/ normal expansion CV - RRR/normal S1 and S2; no murmurs, rubs or gallops;  PMI nondisplaced Abdomen -NT/ND, no HSM, no mass, + bowel sounds, no bruit 2+ femoral pulses, no bruits Ext-no edema, chords, 2+ DP; varicosities noted Neuro-grossly nonfocal  ECG normal sinus rhythm at a rate of 67. Left anterior fascicular block. No ST changes.

## 2011-02-25 NOTE — Assessment & Plan Note (Signed)
Continue present blood pressure medications. Managed by primary care.

## 2011-02-25 NOTE — Assessment & Plan Note (Signed)
Patient with left anterior fascicular block on electrocardiogram. No other abnormalities noted. No significant chest pain or dyspnea and no history of palpitations or syncope. I do not think further cardiac workup is necessary.

## 2011-02-25 NOTE — Assessment & Plan Note (Signed)
Management per primary care. 

## 2011-03-23 ENCOUNTER — Other Ambulatory Visit: Payer: Self-pay | Admitting: Family Medicine

## 2011-04-03 DIAGNOSIS — I10 Essential (primary) hypertension: Secondary | ICD-10-CM

## 2011-04-07 ENCOUNTER — Encounter: Payer: Self-pay | Admitting: Family Medicine

## 2011-04-22 ENCOUNTER — Encounter: Payer: Self-pay | Admitting: Family Medicine

## 2011-04-22 ENCOUNTER — Ambulatory Visit
Admission: RE | Admit: 2011-04-22 | Discharge: 2011-04-22 | Disposition: A | Discharge status: Home / Self Care | Payer: Medicare Other | Source: Ambulatory Visit | Attending: Family Medicine | Admitting: Family Medicine

## 2011-04-22 ENCOUNTER — Ambulatory Visit (INDEPENDENT_AMBULATORY_CARE_PROVIDER_SITE_OTHER): Payer: Medicare Other | Admitting: Family Medicine

## 2011-04-22 ENCOUNTER — Telehealth: Payer: Self-pay | Admitting: *Deleted

## 2011-04-22 VITALS — BP 144/72 | HR 92 | Wt 121.0 lb

## 2011-04-22 DIAGNOSIS — M25539 Pain in unspecified wrist: Secondary | ICD-10-CM

## 2011-04-22 DIAGNOSIS — Z23 Encounter for immunization: Secondary | ICD-10-CM

## 2011-04-22 MED ORDER — TRAMADOL HCL 50 MG PO TABS: 50.0000 mg | ORAL_TABLET | Freq: Four times a day (QID) | ORAL | Status: AC | PRN

## 2011-04-22 NOTE — Telephone Encounter (Signed)
Message copied by Lanae Crumbly on Wed Apr 22, 2011  1:32 PM ------      Message from: Nani Gasser D      Created: Wed Apr 22, 2011 11:53 AM       No fracture. Please fit with bilat wrist splints to wear at night for th enext 2 weeks.

## 2011-04-22 NOTE — Progress Notes (Signed)
  Subjective:    Patient ID: Tina Graham, female    DOB: January 25, 1929, 77 y.o.   MRN: [id number]  HPI Waxed her car and then started having pain and swelling in her outer wrists about a week later. They were red initially.  Worse at night.  Hurts to use her wrist the next day. Fingers feel stiff and swollen but most pain in the wrist. Has been Advil- helps her pain.  Tried weraing a wrist sleeve but it was too uncomfortable and felt too tight. She then bought ankle sleeves and put them on her wrists and that even felt uncomfortable..    Review of Systems     Objective:   Physical Exam  Constitutional: She is oriented to person, place, and time. She appears well-developed and well-nourished.  Musculoskeletal:       Wrists with normal range of motion though she does have some pain with full flexion. She also has pain with pronation and supination the wrist. Finger strength is 5 over 5. Wrist strength is 5 over 5. She does have some mild erythema and swelling over the lateral part of both wrists. The left wrist has increased warmth. She is also tender over the left lateral wrist but not the right.  Neurological: She is alert and oriented to person, place, and time.          Assessment & Plan:  Bilateral wrist pain-I am most suspicious of a tendinitis but because of her age I would like to get an x-ray to rule out fracture since she does have increased redness and warmth on the left wrist in particular. The x-ray is negative then we will fit her for bilateral cockup splints to wear at night for the next 2 weeks and see if this improves her symptoms. She can use Advil with food and water as long as it does irritate her stomach. I also gave her tramadol to use it she doesn't have to use it much as much until

## 2011-04-22 NOTE — Telephone Encounter (Signed)
Pt notified and came into office and was fitted with splints bilaterally

## 2011-05-05 ENCOUNTER — Telehealth: Payer: Self-pay | Admitting: Family Medicine

## 2011-05-05 DIAGNOSIS — M25539 Pain in unspecified wrist: Secondary | ICD-10-CM

## 2011-05-05 NOTE — Telephone Encounter (Signed)
Pt called and stated she is unable to use the cock up splints to hands.  COuld not sleep in them.  They were place 2 weeks ago and she tried loosening them but that didn't help.  Please advise what to tell the pt. Plan:  Routed to Dr. Marlyne Beards, LPN [facility]

## 2011-05-05 NOTE — Telephone Encounter (Signed)
Ok to sent to ortho/sport med if she is ok with this.

## 2011-05-05 NOTE — Telephone Encounter (Signed)
Pt informed will send to ortho/sports medicine for wrist pain. [name], LPN [facility]

## 2011-05-27 ENCOUNTER — Other Ambulatory Visit: Payer: Self-pay | Admitting: Family Medicine

## 2011-06-12 ENCOUNTER — Encounter: Payer: Self-pay | Admitting: Family Medicine

## 2011-06-12 ENCOUNTER — Ambulatory Visit (INDEPENDENT_AMBULATORY_CARE_PROVIDER_SITE_OTHER): Payer: Medicare Other | Admitting: Family Medicine

## 2011-06-12 VITALS — BP 104/74 | HR 71 | Temp 98.2°F | Ht 60.0 in | Wt 122.0 lb

## 2011-06-12 DIAGNOSIS — J40 Bronchitis, not specified as acute or chronic: Secondary | ICD-10-CM

## 2011-06-12 MED ORDER — GUAIFENESIN-CODEINE 100-10 MG/5ML PO SYRP: 5.0000 mL | ORAL_SOLUTION | Freq: Three times a day (TID) | ORAL | Status: AC | PRN

## 2011-06-12 MED ORDER — AZITHROMYCIN 250 MG PO TABS: ORAL_TABLET | ORAL | Status: AC

## 2011-06-12 NOTE — Patient Instructions (Signed)
Call if not getting better. Can fill the antibiotic if not better after the weekend.

## 2011-06-12 NOTE — Progress Notes (Signed)
  Subjective:    Patient ID: Tina Graham, female    DOB: 10-Jan-1929, 77 y.o.   MRN: [id number]  HPI 6 days ago had a nose bleed and started coughing. Has felt worse the last 2-3 days. Has had chest congsetion. Pulls  Muscle in her right chest last night after coughing.  No fever.  Using Tussin medication.  Also used some rx cough med a few times as well.  No SOB, except with cough.  Mild ST, no ear pain. No HA or sinus pressure. Former smoker.    Review of Systems     Objective:   Physical Exam  Constitutional: She is oriented to person, place, and time. She appears well-developed and well-nourished.  HENT:  Head: Normocephalic and atraumatic.  Right Ear: External ear normal.  Left Ear: External ear normal.  Nose: Nose normal.  Mouth/Throat: Oropharynx is clear and moist.       TMs and canals are clear.   Eyes: Conjunctivae and EOM are normal. Pupils are equal, round, and reactive to light.  Neck: Neck supple. No thyromegaly present.  Cardiovascular: Normal rate, regular rhythm and normal heart sounds.   Pulmonary/Chest: Effort normal and breath sounds normal. She has no wheezes.  Lymphadenopathy:    She has no cervical adenopathy.  Neurological: She is alert and oriented to person, place, and time.  Skin: Skin is warm and dry.  Psychiatric: She has a normal mood and affect.          Assessment & Plan:  Bronchitis - Call if not better in one week. Explained likely viral so try to hold off on th eABX until after the weekend to see if getting better.  Will refill her cough med. If suddenly gets worse then can fill the zpack.  Call if any concners or getting fever or SOB.

## 2011-07-07 ENCOUNTER — Encounter: Payer: Self-pay | Admitting: Family Medicine

## 2011-07-07 ENCOUNTER — Ambulatory Visit (INDEPENDENT_AMBULATORY_CARE_PROVIDER_SITE_OTHER): Payer: Medicare Other | Admitting: Family Medicine

## 2011-07-07 VITALS — BP 113/64 | HR 65 | Wt 121.0 lb

## 2011-07-07 DIAGNOSIS — I10 Essential (primary) hypertension: Secondary | ICD-10-CM

## 2011-07-07 DIAGNOSIS — E039 Hypothyroidism, unspecified: Secondary | ICD-10-CM

## 2011-07-07 DIAGNOSIS — M21619 Bunion of unspecified foot: Secondary | ICD-10-CM

## 2011-07-07 LAB — LIPID PANEL
Cholesterol: 227 mg/dL — ABNORMAL HIGH (ref 0–200)
HDL: 56 mg/dL (ref >39)
Total CHOL/HDL Ratio: 4.1 Ratio

## 2011-07-07 LAB — TSH: TSH: 2.307 u[IU]/mL (ref 0.350–4.500)

## 2011-07-07 NOTE — Progress Notes (Signed)
  Subjective:    Patient ID: Tina Graham, female    DOB: 1929/02/14, 77 y.o.   MRN: [id number]  Hypertension This is a chronic problem. The current episode started more than 1 year ago. The problem is controlled. Pertinent negatives include no blurred vision, chest pain, palpitations or shortness of breath. There are no associated agents to hypertension. Past treatments include ACE inhibitors, diuretics, beta blockers and calcium channel blockers. The current treatment provides significant improvement. There are no compliance problems.   She has been walking for exercise.   Pain on the side of her left foot. Discussed that it got worse after the she worse a new pair of bedroom shoes. She is not interested in surgery.   Review of Systems  Eyes: Negative for blurred vision.  Respiratory: Negative for shortness of breath.   Cardiovascular: Negative for chest pain and palpitations.       Objective:   Physical Exam  Constitutional: She is oriented to person, place, and time. She appears well-developed and well-nourished.  HENT:  Head: Normocephalic and atraumatic.  Cardiovascular: Normal rate, regular rhythm and normal heart sounds.   Pulmonary/Chest: Effort normal and breath sounds normal.  Musculoskeletal: She exhibits no edema.       She has a bunion on both feet but the one on her left feet if more red and inflammed and is more prominent   Neurological: She is alert and oriented to person, place, and time.  Skin: Skin is warm and dry.  Psychiatric: She has a normal mood and affect. Her behavior is normal.          Assessment & Plan:  HTN- BP is low. Will stop the amlodipine.  F.U in 6 weeks to make sure BP is better. Due for lipid panel.   Hypothyroid - She is due for rechecl.   Bunion- Discussed padding and avoiding shoe wear that doesn't have a wide toebox. If not getting better rec refer to Podiatry but she wants to hold off on this.

## 2011-07-07 NOTE — Patient Instructions (Signed)
Stop your amlodipine

## 2011-08-18 ENCOUNTER — Ambulatory Visit (INDEPENDENT_AMBULATORY_CARE_PROVIDER_SITE_OTHER): Payer: Medicare Other | Admitting: Family Medicine

## 2011-08-18 ENCOUNTER — Encounter: Payer: Self-pay | Admitting: Family Medicine

## 2011-08-18 VITALS — BP 157/85 | HR 67 | Temp 97.8°F | Wt 122.0 lb

## 2011-08-18 DIAGNOSIS — L821 Other seborrheic keratosis: Secondary | ICD-10-CM

## 2011-08-18 DIAGNOSIS — L570 Actinic keratosis: Secondary | ICD-10-CM

## 2011-08-18 DIAGNOSIS — E785 Hyperlipidemia, unspecified: Secondary | ICD-10-CM

## 2011-08-18 MED ORDER — LEVOTHYROXINE SODIUM 88 MCG PO TABS: 88.0000 ug | ORAL_TABLET | Freq: Every day | ORAL | Status: DC

## 2011-08-18 MED ORDER — POTASSIUM CHLORIDE ER 10 MEQ PO CPCR: 10.0000 meq | ORAL_CAPSULE | Freq: Every day | ORAL | Status: DC

## 2011-08-18 MED ORDER — AMLODIPINE BESYLATE 2.5 MG PO TABS: 2.5000 mg | ORAL_TABLET | Freq: Every day | ORAL | Status: DC

## 2011-08-18 MED ORDER — LISINOPRIL-HYDROCHLOROTHIAZIDE 20-25 MG PO TABS: 1.0000 | ORAL_TABLET | Freq: Every day | ORAL | Status: DC

## 2011-08-18 MED ORDER — ATENOLOL 100 MG PO TABS: 150.0000 mg | ORAL_TABLET | Freq: Every day | ORAL | Status: DC

## 2011-08-18 NOTE — Progress Notes (Signed)
  Subjective:    Patient ID: Tina Graham, female    DOB: 09-27-1928, 78 y.o.   MRN: [id number]  Hypertension This is a chronic problem. The current episode started more than 1 year ago. The problem is controlled. Pertinent negatives include no chest pain or shortness of breath. There are no associated agents to hypertension. Past treatments include diuretics. There are no compliance problems.    She has 2 lesions on the right side of her neck that she would like me to look at today. She says that her color and very easily irritated and are tender. She denies any bleeding. Just has 2 lesions on her left forehead and temple that she would also like me to look at. As well as one on her scalp.   Review of Systems  Respiratory: Negative for shortness of breath.   Cardiovascular: Negative for chest pain.       Objective:   Physical Exam  Constitutional: She is oriented to person, place, and time. She appears well-developed and well-nourished.  HENT:  Head: Normocephalic and atraumatic.  Cardiovascular: Normal rate, regular rhythm and normal heart sounds.        No carotid bruits.   Pulmonary/Chest: Effort normal and breath sounds normal.  Neurological: She is alert and oriented to person, place, and time.  Skin: Skin is warm and dry.  Psychiatric: She has a normal mood and affect. Her behavior is normal.     She has 2 actinic keratoses on her left forehead and temple. She has a very large half centimeter actinic keratosis that is scaling on her scalp. She has 2 seborrheic keratoses on her right neck near her collarbone.     Assessment & Plan:  HTN- her blood pressure is elevated and not at goal today. At her last visit her blood pressure was low and we had stopped her amlodipine which was a 5 mg dose. Astra restart half a tab and we will send a new prescription for amlodipine 2.5 mg to her mail-order. Followup in 3 months.  Actinic keratoses - cryotherapy performed.  Seborrheic  keratoses-therapy performed.  Cryotherapy Procedure Note  Pre-operative Diagnosis: Actinic keratosis, seborrheic keratosis  Post-operative Diagnosis: Actinic keratosis, seborrheic keratosis  Locations: Right neck near clavicle and left forehead and temple and scalp   Indications: irritated  Anesthesia: not required   Procedure Details  History of allergy to iodine: no. Pacemaker? no.  Patient informed of risks (permanent scarring, infection, light or dark discoloration, bleeding, infection, weakness, numbness and recurrence of the lesion) and benefits of the procedure and verbal informed consent obtained.  The areas are treated with liquid nitrogen therapy, frozen until ice ball extended 0.2 mm beyond lesion, allowed to thaw, and treated again. The patient tolerated procedure well.  The patient was instructed on post-op care, warned that there may be blister formation, redness and pain. Recommend OTC analgesia as needed for pain.  Condition: Stable  Complications: none.  Plan: 1. Instructed to keep the area dry and covered for 24-48h and clean thereafter. 2. Warning signs of infection were reviewed.   3. Recommended that the patient use none as needed for pain.  4. Return in prn .

## 2011-08-18 NOTE — Patient Instructions (Signed)

## 2011-08-26 ENCOUNTER — Other Ambulatory Visit: Payer: Self-pay | Admitting: *Deleted

## 2011-10-20 ENCOUNTER — Other Ambulatory Visit: Payer: Self-pay | Admitting: *Deleted

## 2011-10-20 MED ORDER — AMLODIPINE BESYLATE 2.5 MG PO TABS: 2.5000 mg | ORAL_TABLET | Freq: Every day | ORAL | Status: DC

## 2011-10-20 MED ORDER — POTASSIUM CHLORIDE ER 10 MEQ PO CPCR: 10.0000 meq | ORAL_CAPSULE | Freq: Every day | ORAL | Status: DC

## 2011-11-17 ENCOUNTER — Encounter: Payer: Self-pay | Admitting: Family Medicine

## 2011-11-17 ENCOUNTER — Ambulatory Visit (INDEPENDENT_AMBULATORY_CARE_PROVIDER_SITE_OTHER): Payer: Medicare Other | Admitting: Family Medicine

## 2011-11-17 VITALS — BP 138/78 | HR 68 | Ht 60.0 in | Wt 123.0 lb

## 2011-11-17 DIAGNOSIS — E039 Hypothyroidism, unspecified: Secondary | ICD-10-CM

## 2011-11-17 DIAGNOSIS — E785 Hyperlipidemia, unspecified: Secondary | ICD-10-CM

## 2011-11-17 DIAGNOSIS — Z1331 Encounter for screening for depression: Secondary | ICD-10-CM

## 2011-11-17 DIAGNOSIS — Z9181 History of falling: Secondary | ICD-10-CM

## 2011-11-17 DIAGNOSIS — T300 Burn of unspecified body region, unspecified degree: Secondary | ICD-10-CM

## 2011-11-17 DIAGNOSIS — I10 Essential (primary) hypertension: Secondary | ICD-10-CM

## 2011-11-17 NOTE — Progress Notes (Addendum)
  Subjective:    Patient ID: Tina Graham, female    DOB: Jan 11, 1929, 78 y.o.   MRN: [id number]  HPI HTN - Doing well. No CP or SOB.  Doing well.  Taking meds regularly without any SE.    Hyperlipidemia - Doesn't want to take a chol pill.   She has more lesions on her face and back that she would like me to freeze today. Unfortunately we are out of liquid nitrogen so she will reschedule this.  She also has a rash on her left forearm she would like me to look at. She thinks she may have blunted, steam coming out of her teeth at all. She just wants me to make sure that looks okay. She has been applying a burn spray which she feels is helpful.   Review of Systems     Objective:   Physical Exam  Constitutional: She is oriented to person, place, and time. She appears well-developed and well-nourished.  HENT:  Head: Normocephalic and atraumatic.  Cardiovascular: Normal rate, regular rhythm and normal heart sounds.   Pulmonary/Chest: Effort normal and breath sounds normal.  Neurological: She is alert and oriented to person, place, and time.  Skin: Skin is warm and dry.       It does look like she has either a burn or possibly a contact dermatitis on her left forearm. It is erythematous and has a fading border. There are no blisters or pustules.  Psychiatric: She has a normal mood and affect. Her behavior is normal.          Assessment & Plan:  HTN - controlled. At Goal. F/U in 6 months.   Hyperlipidemia-we'll recheck her lipids today. She refuses to take any type of statin or cluster lowering medication.  AK- as her come back and make an appointment so that we can remove for actinic keratoses and seborrheic her taste is with cryotherapy.  First-degree burn, left forearm-continued symptomatic treatment. Call if she develops any signs of infection. If it appears to be spreading them please call the office.  Hypothyroidism-she is asymptomatic. Recheck TSH today.  Depression  screening-PHQ 9 score of 0, negative for depression.  Fall risk assessment-score of 5, low risk for falls.

## 2011-11-18 LAB — COMPLETE METABOLIC PANEL WITH GFR
AST: 19 U/L (ref 0–37)
Albumin: 4.5 g/dL (ref 3.5–5.2)
Alkaline Phosphatase: 77 U/L (ref 39–117)
BUN: 19 mg/dL (ref 6–23)
GFR, Est Non African American: 70 mL/min
Glucose, Bld: 93 mg/dL (ref 70–99)
Potassium: 4.3 mEq/L (ref 3.5–5.3)
Sodium: 138 mEq/L (ref 135–145)
Total Bilirubin: 0.9 mg/dL (ref 0.3–1.2)

## 2011-11-18 LAB — LIPID PANEL
HDL: 59 mg/dL (ref >39)
Total CHOL/HDL Ratio: 3.9 Ratio
VLDL: 27 mg/dL (ref 0–40)

## 2011-12-10 ENCOUNTER — Ambulatory Visit (INDEPENDENT_AMBULATORY_CARE_PROVIDER_SITE_OTHER): Payer: Medicare Other | Admitting: Family Medicine

## 2011-12-10 ENCOUNTER — Encounter: Payer: Self-pay | Admitting: Family Medicine

## 2011-12-10 VITALS — BP 132/67 | HR 59 | Ht 63.0 in | Wt 122.0 lb

## 2011-12-10 DIAGNOSIS — L570 Actinic keratosis: Secondary | ICD-10-CM

## 2011-12-10 DIAGNOSIS — L820 Inflamed seborrheic keratosis: Secondary | ICD-10-CM

## 2011-12-10 DIAGNOSIS — R42 Dizziness and giddiness: Secondary | ICD-10-CM

## 2011-12-10 DIAGNOSIS — I8390 Asymptomatic varicose veins of unspecified lower extremity: Secondary | ICD-10-CM

## 2011-12-10 MED ORDER — AMLODIPINE BESYLATE 2.5 MG PO TABS: 2.5000 mg | ORAL_TABLET | Freq: Every day | ORAL | Status: DC

## 2011-12-10 MED ORDER — ATENOLOL 100 MG PO TABS: 150.0000 mg | ORAL_TABLET | Freq: Every day | ORAL | Status: DC

## 2011-12-10 MED ORDER — LEVOTHYROXINE SODIUM 88 MCG PO TABS: 88.0000 ug | ORAL_TABLET | Freq: Every day | ORAL | Status: DC

## 2011-12-10 MED ORDER — POTASSIUM CHLORIDE ER 10 MEQ PO CPCR: 10.0000 meq | ORAL_CAPSULE | Freq: Every day | ORAL | Status: DC

## 2011-12-10 MED ORDER — LISINOPRIL-HYDROCHLOROTHIAZIDE 20-25 MG PO TABS: 1.0000 | ORAL_TABLET | Freq: Every day | ORAL | Status: DC

## 2011-12-10 NOTE — Progress Notes (Signed)
Subjective:    Patient ID: Tina Graham, female    DOB: Apr 20, 1929, 78 y.o.   MRN: [id number]  HPI  Bent over last week and then when stood up felt dizzy for a few minutes. Then sat down adn stopped for a few minutes and then stopped..  No pain or ear pressure. No fever or recent colds.  No more dizziness. She is getting fitted for hearing aid and needs a notes saying it is ok.   Skin lesions- Hearing to have them frozen  Thinks hit leg. Has spider veins and varicose veings. She doesn't wear compression stocking. She says has been very tender last couple of days. Sore to walk. She did wear an Ace wrap yesterday which did help relieve some of her symptoms. No fever or signs of infection.   Review of Systems     Objective:   Physical Exam  Constitutional: She is oriented to person, place, and time. She appears well-developed and well-nourished.  HENT:  Head: Normocephalic and atraumatic.  Right Ear: External ear normal.  Left Ear: External ear normal.  Nose: Nose normal.  Mouth/Throat: Oropharynx is clear and moist.       TMs and canals are clear.   Eyes: Conjunctivae and EOM are normal. Pupils are equal, round, and reactive to light.  Neck: Neck supple. No thyromegaly present.  Cardiovascular: Normal rate, regular rhythm and normal heart sounds.   Pulmonary/Chest: Effort normal and breath sounds normal. She has no wheezes.  Lymphadenopathy:    She has no cervical adenopathy.  Neurological: She is alert and oriented to person, place, and time. No cranial nerve deficit.       Negative Dix-Hallpike maneuver. No nystagmus. Normal gait.  Skin: Skin is warm and dry.  Psychiatric: She has a normal mood and affect.          Assessment & Plan:  Dizziness - Episode was a week ago and very brief.  Negative Dix-Hallpike maneuver. Unlikely to be benign positional vertigo. I suspect this may have been related to her blood pressure. Certainly her in nose and throat exam is completely  normal. I think she is fine go ahead and get fitted for hearing aid. She has a constant please let me know. Her blood pressure looks fantastic today. Continue current regimen.  Actinic keratoses-we froze several AK's off of her facial cheeks bilaterally and on her scalp. I froze several seborrheic keratoses on her back and under her breasts.discussed f/u wound care.    Varicose and spider veins-we discussed that the main treatment is actually compression with compression stockings. Patient says she really doesn't want to do this. She can also use an Ace wrap. In fact she said she did that yesterday and it did help her symptoms. I see no evidence of cellulitis erythema or phlebitis   .Cryotherapy Procedure Note    Pre-operative Diagnosis: Actinic keratosis, seborrheic keratoses  Post-operative Diagnosis: Actinic keratosis, seborrheic keratoses  Locations: She has several small AK's on both of her facial cheeks bilaterally. She has one small AK on her left temple and several 6 larger AK's on her scalp on the top of her head. She had about 20 seborrheic keratoses on her back in about 4 under her breasts  Indications: irritation  Anesthesia: not required   Procedure Details  Patient informed of risks (permanent scarring, infection, light or dark discoloration, bleeding, infection, weakness, numbness and recurrence of the lesion) and benefits of the procedure and verbal informed consent obtained.  The areas  are treated with liquid nitrogen therapy, frozen until ice ball extended 2 mm beyond lesion, allowed to thaw, and treated again. The patient tolerated procedure well.  The patient was instructed on post-op care, warned that there may be blister formation, redness and pain. Recommend OTC analgesia as needed for pain.  Condition: Stable  Complications: none.  Plan: 1. Instructed to keep the area dry and covered for 24-48h and clean thereafter. 2. Warning signs of infection were  reviewed.   3. Recommended that the patient use OTC acetaminophen as needed for pain.  4. Return prn .

## 2011-12-10 NOTE — Patient Instructions (Signed)
Keep follow up for October

## 2012-05-18 ENCOUNTER — Ambulatory Visit: Payer: Medicare Other | Admitting: Family Medicine

## 2012-05-27 ENCOUNTER — Ambulatory Visit (INDEPENDENT_AMBULATORY_CARE_PROVIDER_SITE_OTHER): Payer: Medicare Other | Admitting: Physician Assistant

## 2012-05-27 ENCOUNTER — Encounter: Payer: Self-pay | Admitting: Physician Assistant

## 2012-05-27 VITALS — BP 147/82 | HR 69 | Ht 63.0 in | Wt 124.0 lb

## 2012-05-27 DIAGNOSIS — K219 Gastro-esophageal reflux disease without esophagitis: Secondary | ICD-10-CM | POA: Insufficient documentation

## 2012-05-27 DIAGNOSIS — I10 Essential (primary) hypertension: Secondary | ICD-10-CM

## 2012-05-27 NOTE — Progress Notes (Signed)
  Subjective:    Patient ID: Tina Graham, female    DOB: 11/17/1928, 78 y.o.   MRN: [id number]  HPI Patient is a 78 yo female who presents to the clinic to discuss flushing face in the evenings. Went she takes her blood pressure it is always in the 150's. She denies any CP, palpitations, numbness and tingling of arms, weakness of extemities, or vision changes. She has not had any anxiety issues. She does report that she has felt more indigestion lately. She has not been taking her Zantac as directed. She has had some dizziness with standing but only occasional. Has been taking all bP meds as directed.  Review of Systems     Objective:   Physical Exam  Constitutional: She is oriented to person, place, and time. She appears well-developed and well-nourished.  HENT:  Head: Normocephalic and atraumatic.  Cardiovascular: Normal rate, regular rhythm and normal heart sounds.   Pulmonary/Chest: Effort normal and breath sounds normal.  Abdominal: Soft. Bowel sounds are normal.  Neurological: She is alert and oriented to person, place, and time.  Skin: Skin is warm and dry.  Psychiatric: She has a normal mood and affect. Her behavior is normal.          Assessment & Plan:  Hypertension- Reassured patient that BP looks pretty good today we want to see it a little bit higher due to her age. Since she has had some self reported high BP measurements I did increase Norvasc to 5mg . Recheck in 2 weeks. Continue to monitor at home. Call if having any dizziness.    GERD- Encouraged patient to take Zantac as directed. This could be contributing to some of symptoms. Recheck symptoms in 2 weeks. Reminded of foods that worsen acid reflux.

## 2012-05-27 NOTE — Patient Instructions (Addendum)
Take whole of Zantac for indigestion.   Will increase amodopline to 5mg . Recheck in 2 weeks for recheck BP.

## 2012-06-10 ENCOUNTER — Encounter: Payer: Self-pay | Admitting: Family Medicine

## 2012-06-10 ENCOUNTER — Ambulatory Visit (INDEPENDENT_AMBULATORY_CARE_PROVIDER_SITE_OTHER): Payer: Medicare Other | Admitting: Family Medicine

## 2012-06-10 VITALS — BP 140/76 | HR 69 | Ht 59.5 in | Wt 124.0 lb

## 2012-06-10 DIAGNOSIS — G4700 Insomnia, unspecified: Secondary | ICD-10-CM

## 2012-06-10 DIAGNOSIS — R002 Palpitations: Secondary | ICD-10-CM

## 2012-06-10 DIAGNOSIS — I10 Essential (primary) hypertension: Secondary | ICD-10-CM

## 2012-06-10 DIAGNOSIS — R232 Flushing: Secondary | ICD-10-CM

## 2012-06-10 MED ORDER — ATENOLOL 100 MG PO TABS: 150.0000 mg | ORAL_TABLET | Freq: Every day | ORAL | Status: DC

## 2012-06-10 MED ORDER — LEVOTHYROXINE SODIUM 88 MCG PO TABS
88.0000 ug | ORAL_TABLET | Freq: Every day | ORAL | Status: DC
Start: 2012-06-10 — End: 2012-06-15

## 2012-06-10 MED ORDER — LISINOPRIL-HYDROCHLOROTHIAZIDE 20-25 MG PO TABS: 1.0000 | ORAL_TABLET | Freq: Every day | ORAL | Status: DC

## 2012-06-10 MED ORDER — ALPRAZOLAM 1 MG PO TABS: 1.0000 mg | ORAL_TABLET | Freq: Every evening | ORAL | Status: DC | PRN

## 2012-06-10 MED ORDER — POTASSIUM CHLORIDE ER 10 MEQ PO CPCR: 10.0000 meq | ORAL_CAPSULE | Freq: Every day | ORAL | Status: DC

## 2012-06-10 MED ORDER — AMLODIPINE BESYLATE 5 MG PO TABS: 5.0000 mg | ORAL_TABLET | Freq: Every day | ORAL | Status: DC

## 2012-06-10 NOTE — Progress Notes (Signed)
Subjective:    Patient ID: Tina Graham, female    DOB: 05/17/1929, 78 y.o.   MRN: [id number]  HPI HTN- amlodipine was increased 2 weeks ago her last office visit because of some elevated blood pressure readings at home.she wants and if she can come off her potassium.  says her face will flush in the afternoons and thinks it may be from high BP. This started well before she started the amlodipine. She says the flushing will typically start in the mid afternoon and lasts until about mid evening. She's not sure exactly what is causing it. She denies any significant changes in her diet or caffeine intake. No CP.  Occ gets palpitations.  Says will sit and rest after a walk but says this is not new. No headaches. No shortness of breath. She has had some headaches with the episodes. Lab Results  Component Value Date   TSH 1.798 11/17/2011      Review of Systems BP 140/76  Pulse 69  Ht 4' 11.5" (1.511 m)  Wt 124 lb (56.246 kg)  BMI 24.63 kg/m2    Allergies  Allergen Reactions  . Simvastatin     REACTION: myalgias    Past Medical History  Diagnosis Date  . Hypothyroidism   . Hyperlipidemia   . Hypertension   . H. pylori infection     treated  . HOH (hard of hearing)     Past Surgical History  Procedure Date  . Thyroidectomy     partial for goiter  . Cholecystectomy   . Appendectomy     History   Social History  . Marital Status: Widowed    Spouse Name: N/A    Number of Children: 5  . Years of Education: N/A   Occupational History  .     Social History Main Topics  . Smoking status: Former Games developer  . Smokeless tobacco: Not on file  . Alcohol Use: No  . Drug Use: No  . Sexually Active:      Comment: housewife, widowed, lives alone, regular exercise   Other Topics Concern  . Not on file   Social History Narrative  . No narrative on file    Family History  Problem Relation Age of Onset  . Cancer Daughter   . Heart disease Sister     unknown specifics     Outpatient Encounter Prescriptions as of 06/10/2012  Medication Sig Dispense Refill  . ALPRAZolam (XANAX) 1 MG tablet Take 1 tablet (1 mg total) by mouth at bedtime as needed for sleep.  90 tablet  0  . amLODipine (NORVASC) 5 MG tablet Take 1 tablet (5 mg total) by mouth daily.  90 tablet  1  . atenolol (TENORMIN) 100 MG tablet Take 1.5 tablets (150 mg total) by mouth daily.  135 tablet  1  . levothyroxine (SYNTHROID, LEVOTHROID) 88 MCG tablet Take 1 tablet (88 mcg total) by mouth daily.  90 tablet  1  . lisinopril-hydrochlorothiazide (PRINZIDE,ZESTORETIC) 20-25 MG per tablet Take 1 tablet by mouth daily.  90 tablet  1  . Multiple Vitamins-Minerals (CLINICAL NUTRIENTS FOR WOMEN) TABS Take by mouth.        . Omega-3 Fatty Acids (FISH OIL) 1000 MG CAPS Take 2,000 mg by mouth daily.        . potassium chloride (MICRO-K) 10 MEQ CR capsule Take 1 capsule (10 mEq total) by mouth daily.  90 capsule  3  . ranitidine (ZANTAC) 150 MG tablet Take 150 mg by mouth  2 (two) times daily.      . [DISCONTINUED] ALPRAZolam (XANAX) 1 MG tablet Take 1 mg by mouth at bedtime as needed.       . [DISCONTINUED] amLODipine (NORVASC) 2.5 MG tablet Take 1 tablet (2.5 mg total) by mouth daily.  90 tablet  3  . [DISCONTINUED] atenolol (TENORMIN) 100 MG tablet Take 1.5 tablets (150 mg total) by mouth daily.  135 tablet  3  . [DISCONTINUED] levothyroxine (SYNTHROID, LEVOTHROID) 88 MCG tablet Take 1 tablet (88 mcg total) by mouth daily.  90 tablet  1  . [DISCONTINUED] lisinopril-hydrochlorothiazide (PRINZIDE,ZESTORETIC) 20-25 MG per tablet Take 1 tablet by mouth daily.  90 tablet  3  . [DISCONTINUED] potassium chloride (MICRO-K) 10 MEQ CR capsule Take 1 capsule (10 mEq total) by mouth daily.  90 capsule  3  . [DISCONTINUED] ranitidine (ZANTAC) 150 MG tablet Take 1 tablet (150 mg total) by mouth 2 (two) times daily.  180 tablet  3          Objective:   Physical Exam  Constitutional: She is oriented to person, place,  and time. She appears well-developed and well-nourished.  HENT:  Head: Normocephalic and atraumatic.  Neck: Neck supple. No thyromegaly present.  Cardiovascular: Normal rate, regular rhythm and normal heart sounds.        No carotid bruits.  Pulmonary/Chest: Effort normal and breath sounds normal.  Lymphadenopathy:    She has no cervical adenopathy.  Neurological: She is alert and oriented to person, place, and time.  Skin: Skin is warm and dry.       Her cheeks and forehead do look flushed today.  Psychiatric: She has a normal mood and affect. Her behavior is normal.          Assessment & Plan:  HTN- controlled. Her blood pressure actually looks better on the 5 mg of amlodipine I do think she is tolerating it well. She wants and if she really needs to continue taking her potassium. Certainly we can stop the potassium for a month and then recheck her levels to see if she still needs it or not. I explained her that sometimes people who take Hydrea Hydrochlorothiazide may actually need additional potassium supplement because the diuretic itself causes excess potassium loss. Certainly we could test this to see if she still needs it. She says it was started years ago, and is not sure she still needs it.  Flushing-unclear etiology. Certainly it could be related to blood pressure but not sure why we'll start all of a sudden and only seem to happen in the afternoon when she does take most of her blood pressure pills in the morning. We will check her thyroid to make sure it's well-regulated. She does have a history of hypothyroidism. She denies any recent weight changes. I would also like to check her for pheochromocytoma. Certainly with high blood pressure and flushing this is a possibility. Also check a CBC with differential. None of her other medications or new that should be causing her symptoms. She's not on niacin.  Palpitations-these are infrequent and don't necessarily occur with the  flushing. EKG shows rate of 57 beats per minute, bradycardic sinus rhythm. Slight leftward axis deviation. No acute ST-T wave changes. Consider cardiology referral especially if her blood work is completely normal.  Insomnia-she would like refill on her Xanax. Scription sent to pharmacy.

## 2012-06-13 LAB — CBC WITH DIFFERENTIAL/PLATELET
HCT: 38.3 % (ref 36.0–46.0)
Hemoglobin: 13.2 g/dL (ref 12.0–15.0)
Lymphocytes Relative: 39 % (ref 12–46)
Lymphs Abs: 2.7 10*3/uL (ref 0.7–4.0)
MCHC: 34.5 g/dL (ref 30.0–36.0)
Monocytes Absolute: 0.6 10*3/uL (ref 0.1–1.0)
Monocytes Relative: 8 % (ref 3–12)
Neutro Abs: 3.5 10*3/uL (ref 1.7–7.7)
Neutrophils Relative %: 50 % (ref 43–77)
RBC: 4.32 MIL/uL (ref 3.87–5.11)

## 2012-06-13 LAB — T4, FREE: Free T4: 1.63 ng/dL (ref 0.80–1.80)

## 2012-06-13 LAB — HEMOGLOBIN A1C
Hgb A1c MFr Bld: 5.6 % (ref <5.7)
Mean Plasma Glucose: 114 mg/dL (ref <117)

## 2012-06-13 LAB — TSH: TSH: 4.64 u[IU]/mL — ABNORMAL HIGH (ref 0.350–4.500)

## 2012-06-13 LAB — T3, FREE: T3, Free: 3.2 pg/mL (ref 2.3–4.2)

## 2012-06-14 ENCOUNTER — Encounter: Payer: Self-pay | Admitting: *Deleted

## 2012-06-15 ENCOUNTER — Encounter: Payer: Self-pay | Admitting: *Deleted

## 2012-06-15 ENCOUNTER — Other Ambulatory Visit: Payer: Self-pay | Admitting: Family Medicine

## 2012-06-15 MED ORDER — LEVOTHYROXINE SODIUM 100 MCG PO TABS: 100.0000 ug | ORAL_TABLET | Freq: Every day | ORAL | Status: DC

## 2012-06-17 LAB — METANEPHRINES, URINE, 24 HOUR
Metaneph Total, Ur: 860 mcg/24 h — ABNORMAL HIGH (ref 224–832)
Metanephrines, Ur: 148 mcg/24 h (ref 90–315)
Normetanephrine, 24H Ur: 712 mcg/24 h — ABNORMAL HIGH (ref 122–676)

## 2012-06-17 LAB — METANEPHRINES, PLASMA
Metanephrine, Free: 43 pg/mL (ref <=57)
Normetanephrine, Free: 253 pg/mL — ABNORMAL HIGH (ref <=148)

## 2012-06-17 LAB — CATECHOLAMINES, FRACTIONATED, PLASMA: Norepinephrine: 1009 pg/mL

## 2012-06-18 LAB — CATECHOLAMINES, FRACTIONATED, URINE, 24 HOUR
Calculated Total (E+NE): 62 mcg/24 h (ref 26–121)
Total Volume - CF 24Hr U: 3400 mL

## 2012-06-20 ENCOUNTER — Other Ambulatory Visit: Payer: Self-pay | Admitting: Family Medicine

## 2012-06-20 DIAGNOSIS — R7989 Other specified abnormal findings of blood chemistry: Secondary | ICD-10-CM

## 2012-07-04 ENCOUNTER — Encounter: Payer: Self-pay | Admitting: Family Medicine

## 2012-07-04 ENCOUNTER — Ambulatory Visit (INDEPENDENT_AMBULATORY_CARE_PROVIDER_SITE_OTHER): Payer: Medicare Other | Admitting: Family Medicine

## 2012-07-04 VITALS — BP 144/72 | HR 68 | Ht 59.5 in | Wt 124.0 lb

## 2012-07-04 DIAGNOSIS — I10 Essential (primary) hypertension: Secondary | ICD-10-CM

## 2012-07-04 DIAGNOSIS — E039 Hypothyroidism, unspecified: Secondary | ICD-10-CM

## 2012-07-04 DIAGNOSIS — R232 Flushing: Secondary | ICD-10-CM

## 2012-07-04 DIAGNOSIS — R55 Syncope and collapse: Secondary | ICD-10-CM

## 2012-07-04 NOTE — Patient Instructions (Signed)
Blood pressure gaol of under 145/90. Too low if under 110/80. If see several of these then call me.   Keep the amlodipine at half a tab.

## 2012-07-04 NOTE — Progress Notes (Signed)
  Subjective:    Patient ID: Tina Graham, female    DOB: May 13, 1929, 78 y.o.   MRN: [id number]  HPI HTN- Taking meds. No CP or SOB.  Says felt really dizzy yesterday. Says she decided not to drive to church because of the dizziness, because she felt it was unsafe. She thought maybe her blood pressure was too low so she decided to cut the amlodipine in half. Says she is still having flushing episodes but not every day.  Doesn't check BP at home.    Hypothyroid - We recheck her TSH and it was elevated.  On new dose for 2 weeks.  Says feeling some better.     Review of Systems     Objective:   Physical Exam  Constitutional: She is oriented to person, place, and time. She appears well-developed and well-nourished.  HENT:  Head: Normocephalic and atraumatic.  Neck: Neck supple. No thyromegaly present.  Cardiovascular: Normal rate, regular rhythm and normal heart sounds.        No carotid bruits.   Pulmonary/Chest: Effort normal and breath sounds normal.  Musculoskeletal: She exhibits no edema.  Lymphadenopathy:    She has no cervical adenopathy.  Neurological: She is alert and oriented to person, place, and time.  Skin: Skin is warm and dry.  Psychiatric: She has a normal mood and affect. Her behavior is normal.          Assessment & Plan:  HTN - Contiue half a tab of amlodipine.  Strongly encouraged her to her own home blood pressure cuff. Encouraged her to get an arm cuff. These are little bit more accurate in the wrist cuff. That way she can track her blood pressure. Also that way when she doesn't feel well it yesterday she can check her blood pressure and see if it's too low, too high or if it still normal and may be unrelated to her blood pressure. I did write out some parameters for her on the AVS.  Hypothyroid- she is employed endocrinologist in about 3 weeks. I suspect we'll go ahead and recheck her TSH at that time which would be perfect. She will been on her new dose for him  to 6 weeks.  Flushing-she still having flushing. She still looks close to the office today. She did have abnormal normetanephrine send her urine sample which is once and her to endocrinology for further evaluation.  Near syncope episode yesterday-she has a regular rate and rhythm of her heart today. We did do an EKG last time. I explained her that if the endocrine workup is normal then I would like for her to consider seeing cardiology the patient said she was not interested in doing that. I think that we start with monitoring blood pressure. She can let me know if she has another episode. I did strongly encourage her not to drive if this happens.

## 2012-08-17 ENCOUNTER — Other Ambulatory Visit: Payer: Self-pay | Admitting: *Deleted

## 2012-08-17 MED ORDER — LEVOTHYROXINE SODIUM 100 MCG PO TABS: 100.0000 ug | ORAL_TABLET | Freq: Every day | ORAL | Status: DC

## 2012-09-05 ENCOUNTER — Encounter: Payer: Self-pay | Admitting: Family Medicine

## 2012-09-05 ENCOUNTER — Ambulatory Visit (INDEPENDENT_AMBULATORY_CARE_PROVIDER_SITE_OTHER): Payer: Medicare Other | Admitting: Family Medicine

## 2012-09-05 VITALS — BP 148/71 | HR 67 | Ht 59.6 in | Wt 126.0 lb

## 2012-09-05 DIAGNOSIS — I10 Essential (primary) hypertension: Secondary | ICD-10-CM

## 2012-09-05 DIAGNOSIS — L570 Actinic keratosis: Secondary | ICD-10-CM

## 2012-09-05 DIAGNOSIS — E039 Hypothyroidism, unspecified: Secondary | ICD-10-CM

## 2012-09-05 LAB — LIPID PANEL
Cholesterol: 244 mg/dL — ABNORMAL HIGH (ref 0–200)
Total CHOL/HDL Ratio: 4.1 Ratio
Triglycerides: 120 mg/dL (ref <150)

## 2012-09-05 LAB — COMPLETE METABOLIC PANEL WITH GFR
Albumin: 4.3 g/dL (ref 3.5–5.2)
Alkaline Phosphatase: 77 U/L (ref 39–117)
BUN: 15 mg/dL (ref 6–23)
Calcium: 9.8 mg/dL (ref 8.4–10.5)
Creat: 0.91 mg/dL (ref 0.50–1.10)
GFR, Est Non African American: 59 mL/min — ABNORMAL LOW
Glucose, Bld: 95 mg/dL (ref 70–99)
Potassium: 4.3 mEq/L (ref 3.5–5.3)

## 2012-09-05 MED ORDER — IMIQUIMOD 5 % EX CREA: TOPICAL_CREAM | CUTANEOUS | Status: DC

## 2012-09-05 MED ORDER — ALPRAZOLAM 1 MG PO TABS: 1.0000 mg | ORAL_TABLET | Freq: Every evening | ORAL | Status: DC | PRN

## 2012-09-05 NOTE — Progress Notes (Signed)
  Subjective:    Patient ID: Tina Graham, female    DOB: [birth year], 79 y.o.   MRN: [id number]  HPI HTN-  Pt denies chest pain, SOB or heart palpitations.  Taking meds as directed w/o problems.  Denies medication side effects.  Home SBPs have running 108-143 at home. She has f/u with endocrine on Thursday.   Has felt dizzy on and off but not in the last week. She staggered her BP pills and says that has helped her dizziness.  She also have endo appt later this week. They are evaluated her adrenal glands.  Had repeat 24 hour urine. Not sure of the results.    Wrist pain has been better since started the osteo-biflex.      Review of Systems     Objective:   Physical Exam  Constitutional: She is oriented to person, place, and time. She appears well-developed and well-nourished.  HENT:  Head: Normocephalic and atraumatic.  Cardiovascular: Normal rate, regular rhythm and normal heart sounds.   Pulmonary/Chest: Effort normal and breath sounds normal.  Neurological: She is alert and oriented to person, place, and time.  Skin: Skin is warm and dry.  Psychiatric: She has a normal mood and affect. Her behavior is normal.   Multiple AK on her facial cheeks and eyebrows and scalp.        Assessment & Plan:  HTN - Well controlled based on home medications  F/U in 6 months. Doing well.  Dizziness- Improved since has spaced her bp meds.    AK on her face- We have tried cryotherapy in the past. Will tx w/ aldara. Call if too expensive.    Hypothyroid - Due to recheck her thyroid.

## 2012-09-05 NOTE — Patient Instructions (Signed)
Apply the cream to the rough spots on her face 2-3 times per week (don't apply to full face). Full treatment course is for total of 16 weeks. Disuse small amount and ribavirin and wash hands completely.

## 2012-09-06 ENCOUNTER — Encounter: Payer: Self-pay | Admitting: *Deleted

## 2012-09-12 ENCOUNTER — Telehealth: Payer: Self-pay | Admitting: *Deleted

## 2012-09-26 ENCOUNTER — Ambulatory Visit (INDEPENDENT_AMBULATORY_CARE_PROVIDER_SITE_OTHER): Payer: Medicare Other | Admitting: Family Medicine

## 2012-09-26 ENCOUNTER — Other Ambulatory Visit: Payer: Self-pay | Admitting: Physician Assistant

## 2012-09-26 ENCOUNTER — Encounter: Payer: Self-pay | Admitting: Family Medicine

## 2012-09-26 VITALS — BP 160/78 | HR 109

## 2012-09-26 DIAGNOSIS — I10 Essential (primary) hypertension: Secondary | ICD-10-CM

## 2012-09-26 DIAGNOSIS — L570 Actinic keratosis: Secondary | ICD-10-CM

## 2012-09-26 DIAGNOSIS — Z139 Encounter for screening, unspecified: Secondary | ICD-10-CM

## 2012-09-26 MED ORDER — CEPHALEXIN 500 MG PO CAPS: 500.0000 mg | ORAL_CAPSULE | Freq: Three times a day (TID) | ORAL | Status: DC

## 2012-09-26 NOTE — Progress Notes (Signed)
  Subjective:    Patient ID: Tina Graham, female    DOB: [birth year], 79 y.o.   MRN: [id number]  HPI Has been using Aldara for 2 weeks ( 6 times) and last application was Friday.  Has been using sunscreen when goes outside. Says they are very red and scabbed over.  Has felt sick overall.  Says had a sore in her nose and mouth. Has felt nauseated.  No fever.    Review of Systems     Objective:   Physical Exam  Constitutional: She appears well-developed and well-nourished.  HENT:  Head: Normocephalic and atraumatic.  Skin: Skin is warm and dry.  She has large lesions that are peeling off her face.  They are very erythematous and some look like they might have some amber crusting.  The inflammation is quite impressive.   Psychiatric: She has a normal mood and affect. Her behavior is normal.          Assessment & Plan:  Actinic keratosis she's had a very brisk response to Aldara. At that time concern a couple of lesions if they could even be infected. Start Keflex 500 mg 3 times a day. Call if not significantly better in one week. Stop the Aldara. Certainly the week if it looks a lot better she can just use one or 2 lesions a tree and decrease down to once a week instead of 3 times a week and see if that makes a difference. I will also go ahead and refer her back to her dermatologist for now.  Hypertension-uncontrolled today. Normally it looks a lot better than this. I like to see her back in one month to make sure that it's getting back down to normal.

## 2012-09-26 NOTE — Patient Instructions (Addendum)
Stop the Aldara and can try in a localized spot for once a week and see if tolerate better than 3 times a week Complete the antibiotic.

## 2012-09-27 ENCOUNTER — Ambulatory Visit: Payer: Medicare Other

## 2012-09-27 ENCOUNTER — Ambulatory Visit (INDEPENDENT_AMBULATORY_CARE_PROVIDER_SITE_OTHER): Payer: Medicare Other

## 2012-09-27 DIAGNOSIS — Z139 Encounter for screening, unspecified: Secondary | ICD-10-CM

## 2012-09-27 DIAGNOSIS — Z1231 Encounter for screening mammogram for malignant neoplasm of breast: Secondary | ICD-10-CM

## 2012-11-21 ENCOUNTER — Other Ambulatory Visit: Payer: Self-pay | Admitting: *Deleted

## 2012-11-21 ENCOUNTER — Encounter: Payer: Self-pay | Admitting: *Deleted

## 2012-11-21 DIAGNOSIS — E039 Hypothyroidism, unspecified: Secondary | ICD-10-CM

## 2012-11-21 MED ORDER — LEVOTHYROXINE SODIUM 100 MCG PO TABS: 100.0000 ug | ORAL_TABLET | Freq: Every day | ORAL | Status: DC

## 2012-11-28 ENCOUNTER — Other Ambulatory Visit: Payer: Self-pay | Admitting: *Deleted

## 2012-11-28 MED ORDER — LEVOTHYROXINE SODIUM 100 MCG PO TABS: 100.0000 ug | ORAL_TABLET | Freq: Every day | ORAL | Status: DC

## 2012-12-16 ENCOUNTER — Ambulatory Visit (INDEPENDENT_AMBULATORY_CARE_PROVIDER_SITE_OTHER): Payer: Medicare Other | Admitting: Family Medicine

## 2012-12-16 ENCOUNTER — Encounter: Payer: Self-pay | Admitting: Family Medicine

## 2012-12-16 VITALS — BP 153/73 | HR 64 | Wt 125.0 lb

## 2012-12-16 DIAGNOSIS — E871 Hypo-osmolality and hyponatremia: Secondary | ICD-10-CM

## 2012-12-16 DIAGNOSIS — E876 Hypokalemia: Secondary | ICD-10-CM

## 2012-12-16 LAB — BASIC METABOLIC PANEL WITH GFR
Calcium: 10.2 mg/dL (ref 8.4–10.5)
GFR, Est African American: 66 mL/min
GFR, Est Non African American: 57 mL/min — ABNORMAL LOW
Potassium: 4.5 mEq/L (ref 3.5–5.3)
Sodium: 138 mEq/L (ref 135–145)

## 2012-12-16 MED ORDER — LISINOPRIL 20 MG PO TABS: 20.0000 mg | ORAL_TABLET | Freq: Every day | ORAL | Status: DC

## 2012-12-16 NOTE — Progress Notes (Signed)
  Subjective:    Patient ID: Tina Graham, female    DOB: 01/27/1929, 79 y.o.   MRN: [id number]  HPI Patient was seen at [facility] on Dec 08 2012. She was discharged the following day. She had a syncopal event. She was also noted to have hyponatremia and hypokalemia on her blood work. They felt like her blood pressure has actually dropped too low. They stopped her HCTZ and rthey decreased her atenolol but she is actually taking 1 tab BID.  She does not take ASA daily.  She is til on her potassium.   Still feeling a little shakey and but the lightheadedness is better.    Review of Systems     Objective:   Physical Exam  Constitutional: She is oriented to person, place, and time. She appears well-developed and well-nourished.  HENT:  Head: Normocephalic and atraumatic.  Neck: Neck supple. No thyromegaly present.  Cardiovascular: Normal rate, regular rhythm and normal heart sounds.   Pulmonary/Chest: Effort normal and breath sounds normal.  Musculoskeletal: She exhibits no edema.  Lymphadenopathy:    She has no cervical adenopathy.  Neurological: She is alert and oriented to person, place, and time.  Skin: Skin is warm and dry.  Psychiatric: She has a normal mood and affect. Her behavior is normal.          Assessment & Plan:  Syncope - Feeling better off the hctz.  I would like her to keep track of her home blood pressures over the next 3 weeks and bring them in for make. I must wonder she has a moment of whitecoat hypertension wears high when she comes in here but it actually low home. We will see how she does with just the lisinopril as well as the atenolol. We may need to end up cutting back on the at all when I see her back in 3 weeks. If she starts to have repeat low blood pressures at home but asked her call the office and not wait until her followup appointment.  Hypokalemia - Recheck to see if can dicontinue the potassium. Hopefully now that she is off HCTZ will be able to  discontinue the potassium. If we are able to stop it did not really want to recheck her potassium off of the medication at her followup in 3 weeks.   Hyponatremia - due to recheck levels. May hve been from the HCTZ.

## 2012-12-16 NOTE — Patient Instructions (Addendum)
Please bring a log of her blood pressures. If you're noticing any blood pressures under 115 before you followup with me then please go ahead and call the office sooner rather than later. Continue work on eating low salt diet. If your blood pressure does drop low then drink a tall glass of fluid, sit down and rest, and eat something salty.

## 2012-12-16 NOTE — Progress Notes (Signed)
Quick Note:  All labs are normal. ______ 

## 2013-01-09 ENCOUNTER — Encounter: Payer: Self-pay | Admitting: Family Medicine

## 2013-01-09 ENCOUNTER — Ambulatory Visit (INDEPENDENT_AMBULATORY_CARE_PROVIDER_SITE_OTHER): Payer: Medicare Other | Admitting: Family Medicine

## 2013-01-09 VITALS — BP 122/80 | HR 77 | Wt 126.0 lb

## 2013-01-09 DIAGNOSIS — I10 Essential (primary) hypertension: Secondary | ICD-10-CM

## 2013-01-09 DIAGNOSIS — M25473 Effusion, unspecified ankle: Secondary | ICD-10-CM

## 2013-01-09 DIAGNOSIS — M79604 Pain in right leg: Secondary | ICD-10-CM

## 2013-01-09 DIAGNOSIS — E876 Hypokalemia: Secondary | ICD-10-CM

## 2013-01-09 DIAGNOSIS — M79609 Pain in unspecified limb: Secondary | ICD-10-CM

## 2013-01-09 DIAGNOSIS — M25471 Effusion, right ankle: Secondary | ICD-10-CM

## 2013-01-09 DIAGNOSIS — E039 Hypothyroidism, unspecified: Secondary | ICD-10-CM

## 2013-01-09 LAB — BASIC METABOLIC PANEL WITH GFR
BUN: 14 mg/dL (ref 6–23)
Calcium: 9.9 mg/dL (ref 8.4–10.5)
GFR, Est African American: 70 mL/min
GFR, Est Non African American: 61 mL/min
Potassium: 4.5 mEq/L (ref 3.5–5.3)
Sodium: 139 mEq/L (ref 135–145)

## 2013-01-09 NOTE — Progress Notes (Signed)
  Subjective:    Patient ID: Tina Graham, female    DOB: 08-21-1928, 79 y.o.   MRN: [id number]  HPI Was out of town last week and was having pain in the right lower leg.  More swollen on the right compared to the left leg.pain is now gone now that she is back home.  WE stopped her HCTZ about a months ago after hospitalized for syncope and hyponatremia or hypokalemia  .  Home BP s running 130-160/70s.  Tries to minimize her salt.  No more syncopal events.    HTN- Pt denies chest pain, SOB, dizziness, or heart palpitations.  Taking meds as directed w/o problems.  Denies medication side effects.    Hypothyroidism-taking medication regularly. No recent skin or hair changes or weight changes.   Review of Systems     Objective:   Physical Exam  Constitutional: She is oriented to person, place, and time. She appears well-developed and well-nourished.  HENT:  Head: Normocephalic and atraumatic.  Cardiovascular: Normal rate, regular rhythm and normal heart sounds.   Pulmonary/Chest: Effort normal and breath sounds normal.  Neurological: She is alert and oriented to person, place, and time.  Skin: Skin is warm and dry.  Psychiatric: She has a normal mood and affect. Her behavior is normal.   Right knee knee and ankle with normal range of motion. She is nontender over the right lower leg. Non-tender with calf squeeze. There is no pitting edema. Reflexes are 2+ bilaterally. Strength at the knee and ankle is 5 out of 5 bilaterally.       Assessment & Plan:  HTN - Well controlled today. F/U in 6 weeks. Home BPs fair. A few that are high. Monitor diet and salt intake.   Hypokalemia - Still on her potassium.  Recheck BMP.  If her potassium is normal then we can stop the supplement which she was originally taking because she was on hydrochlorothiazide. Then we can recheck a potassium level in one month off of the medication.  Right leg pain-her pain has actually completely resolved. There is no  evidence of edema on exam today. No calf tenderness. Nontender over the skin itself. Nothing concerning for DVT at this point time. It's possible that she could be noticing a little more swelling in usual since she has stopped her diuretic which she has been on for quite some time.  Hypothyroidism-due to recheck TSH. Also with recently swelling want to make sure that her thyroid is well controlled.

## 2013-01-09 NOTE — Patient Instructions (Addendum)
If blood pressure is under 110/60 then eat something salty and drink a large class of water Blood pressure goal is under 150/90 If your potassium is normal on blood work then we can stop the extra potassium you are taking.  The we can recheck level in one month.

## 2013-02-09 ENCOUNTER — Other Ambulatory Visit: Payer: Self-pay | Admitting: *Deleted

## 2013-02-09 DIAGNOSIS — I10 Essential (primary) hypertension: Secondary | ICD-10-CM

## 2013-02-09 LAB — BASIC METABOLIC PANEL WITH GFR
BUN: 14 mg/dL (ref 6–23)
CO2: 27 meq/L (ref 19–32)
Calcium: 9.8 mg/dL (ref 8.4–10.5)
Chloride: 104 meq/L (ref 96–112)
Creat: 0.91 mg/dL (ref 0.50–1.10)
Glucose, Bld: 90 mg/dL (ref 70–99)
Potassium: 4.3 meq/L (ref 3.5–5.3)
Sodium: 140 meq/L (ref 135–145)

## 2013-02-09 NOTE — Progress Notes (Signed)
Quick Note:  All labs are normal. ______ 

## 2013-02-20 ENCOUNTER — Ambulatory Visit (INDEPENDENT_AMBULATORY_CARE_PROVIDER_SITE_OTHER): Payer: Medicare Other | Admitting: Family Medicine

## 2013-02-20 ENCOUNTER — Encounter: Payer: Self-pay | Admitting: Family Medicine

## 2013-02-20 VITALS — BP 141/71 | HR 61 | Wt 124.0 lb

## 2013-02-20 DIAGNOSIS — E039 Hypothyroidism, unspecified: Secondary | ICD-10-CM

## 2013-02-20 DIAGNOSIS — E785 Hyperlipidemia, unspecified: Secondary | ICD-10-CM

## 2013-02-20 DIAGNOSIS — I10 Essential (primary) hypertension: Secondary | ICD-10-CM

## 2013-02-20 MED ORDER — AMLODIPINE BESYLATE 2.5 MG PO TABS: 2.5000 mg | ORAL_TABLET | Freq: Every day | ORAL | Status: DC

## 2013-02-20 MED ORDER — LISINOPRIL 20 MG PO TABS: 20.0000 mg | ORAL_TABLET | Freq: Every day | ORAL | Status: DC

## 2013-02-20 MED ORDER — LEVOTHYROXINE SODIUM 100 MCG PO TABS: 100.0000 ug | ORAL_TABLET | Freq: Every day | ORAL | Status: DC

## 2013-02-20 MED ORDER — RANITIDINE HCL 150 MG PO TABS: 150.0000 mg | ORAL_TABLET | Freq: Two times a day (BID) | ORAL | Status: DC

## 2013-02-20 NOTE — Progress Notes (Signed)
Subjective:    Patient ID: Tina Graham, female    DOB: [birth year], 79 y.o.   MRN: [id number]  HPI HTN-  Pt denies chest pain, SOB, dizziness, or heart palpitations.  Taking meds as directed w/o problems.  Denies medication side effects. She cuts her atenolol in half.   Hypothyroid - no skin or hair changes.  No weight changes. Taking meds regularly.    Hyperlipidemia - on fish oil once a day.  Has been walking for exercise 6 days per week.   Review of Systems  BP 141/71  Pulse 61  Wt 124 lb (56.246 kg)  BMI 24.54 kg/m2    Allergies  Allergen Reactions  . Simvastatin     REACTION: myalgias    Past Medical History  Diagnosis Date  . Hypothyroidism   . Hyperlipidemia   . Hypertension   . H. pylori infection     treated  . HOH (hard of hearing)     Past Surgical History  Procedure Laterality Date  . Thyroidectomy      partial for goiter  . Cholecystectomy    . Appendectomy      History   Social History  . Marital Status: Widowed    Spouse Name: N/A    Number of Children: 5  . Years of Education: N/A   Occupational History  .     Social History Main Topics  . Smoking status: Former Games developer  . Smokeless tobacco: Not on file  . Alcohol Use: No  . Drug Use: No  . Sexually Active:      Comment: housewife, widowed, lives alone, regular exercise   Other Topics Concern  . Not on file   Social History Narrative  . No narrative on file    Family History  Problem Relation Age of Onset  . Cancer Daughter   . Heart disease Sister     unknown specifics    Outpatient Encounter Prescriptions as of 02/20/2013  Medication Sig Dispense Refill  . ALPRAZolam (XANAX) 1 MG tablet Take 1 tablet (1 mg total) by mouth at bedtime as needed for sleep.  90 tablet  0  . amLODipine (NORVASC) 2.5 MG tablet Take 1 tablet (2.5 mg total) by mouth daily.  90 tablet  1  . atenolol (TENORMIN) 100 MG tablet Take 50 mg by mouth 2 (two) times daily.       . Calcium Carbonate-Vitamin  D (CALCIUM-VITAMIN D) 500-200 MG-UNIT per tablet Take 1 tablet by mouth daily.      Marland Kitchen levothyroxine (SYNTHROID, LEVOTHROID) 100 MCG tablet Take 1 tablet (100 mcg total) by mouth daily.  90 tablet  1  . lisinopril (PRINIVIL,ZESTRIL) 20 MG tablet Take 1 tablet (20 mg total) by mouth daily.  90 tablet  1  . Misc Natural Products (OSTEO BI-FLEX TRIPLE STRENGTH) TABS Take 1 tablet by mouth daily.       . Omega-3 Fatty Acids (FISH OIL) 1000 MG CAPS Take 1,000 mg by mouth daily.       . ranitidine (ZANTAC) 150 MG tablet Take 1 tablet (150 mg total) by mouth 2 (two) times daily.  180 tablet  2  . [DISCONTINUED] amLODipine (NORVASC) 2.5 MG tablet Take 2.5 mg by mouth daily.      . [DISCONTINUED] levothyroxine (SYNTHROID, LEVOTHROID) 100 MCG tablet Take 1 tablet (100 mcg total) by mouth daily.  90 tablet  0  . [DISCONTINUED] lisinopril (PRINIVIL,ZESTRIL) 20 MG tablet Take 1 tablet (20 mg total) by mouth daily.  90  tablet  1  . [DISCONTINUED] potassium chloride (MICRO-K) 10 MEQ CR capsule Take 1 capsule (10 mEq total) by mouth daily.  90 capsule  3  . [DISCONTINUED] ranitidine (ZANTAC) 150 MG tablet Take 150 mg by mouth 2 (two) times daily.       No facility-administered encounter medications on file as of 02/20/2013.          Objective:   Physical Exam  Constitutional: She is oriented to person, place, and time. She appears well-developed and well-nourished.  HENT:  Head: Normocephalic and atraumatic.  Cardiovascular: Normal rate, regular rhythm and normal heart sounds.   Pulmonary/Chest: Effort normal and breath sounds normal.  Neurological: She is alert and oriented to person, place, and time.  Skin: Skin is warm and dry.  Psychiatric: She has a normal mood and affect. Her behavior is normal.          Assessment & Plan:  HTN - well controlled. F/U in 6 months.  Labs uptodate.   Hypothyroid - Well controled. F/u in 6 mo.  Lab Results  Component Value Date   TSH 0.890 01/09/2013    Hyperlipidemia  - UTD.  Continue to work on diet and exercise. ot on a statin.  Lab Results  Component Value Date   CHOL 244* 09/05/2012   HDL 60 09/05/2012   LDLCALC 865* 09/05/2012   TRIG 120 09/05/2012   CHOLHDL 4.1 09/05/2012

## 2013-03-06 ENCOUNTER — Ambulatory Visit: Payer: Medicare Other | Admitting: Family Medicine

## 2013-06-08 ENCOUNTER — Encounter: Payer: Self-pay | Admitting: Family Medicine

## 2013-06-08 ENCOUNTER — Ambulatory Visit (INDEPENDENT_AMBULATORY_CARE_PROVIDER_SITE_OTHER): Payer: Medicare Other | Admitting: Family Medicine

## 2013-06-08 VITALS — BP 158/83 | HR 89 | Temp 97.5°F | Wt 123.0 lb

## 2013-06-08 DIAGNOSIS — I10 Essential (primary) hypertension: Secondary | ICD-10-CM

## 2013-06-08 DIAGNOSIS — R232 Flushing: Secondary | ICD-10-CM

## 2013-06-08 DIAGNOSIS — Z23 Encounter for immunization: Secondary | ICD-10-CM

## 2013-06-08 MED ORDER — AMLODIPINE BESYLATE 2.5 MG PO TABS: 2.5000 mg | ORAL_TABLET | Freq: Two times a day (BID) | ORAL | Status: DC

## 2013-06-08 NOTE — Patient Instructions (Signed)
Continue to keep track of your blood pressures for the next 2 weeks. Bring those numbers and for me to review acute at this time. Call if he seen blood pressures going over 180.

## 2013-06-08 NOTE — Progress Notes (Signed)
  Subjective:    Patient ID: Tina Graham, female    DOB: 1929-06-17, 79 y.o.   MRN: [id number]  HPI Seen at [facility] on 06/05/2011 for facial flushing and hypertension. At that time her blood pressure systolic was over 200 her diastolic was over 100. It does not look like they adjusted her medications. They did do an EKG which showed no new changes as well as a chest x-ray that was negative. She had a urine culture that was negative. She was discharged home. Since being home she has been tracking her blood pressures. She has been measuring her blood pressure twice a day since she's been home from the hospital even before hospitalization. It looks like before she went to the hospital most of her pressures were running in the 170s up to 200. She says her flushing is finally better over the last couple of days. Since being home from the hospital her pressures have Been running in the 130s to 170s. The majority of them are over 150 systolic. Diastolic okay and pulse has been normal. They did increase her amlodipine 2.5 mg to twice a day instead of once a day. She's tolerating it that well without any side effects. She reports that she is taking her amlodipine half a tab twice a day and taking her lisinopril daily. No chest pain or short of breath. No swelling or edema.     Review of Systems     Objective:   Physical Exam  Constitutional: She is oriented to person, place, and time. She appears well-developed and well-nourished.  HENT:  Head: Normocephalic and atraumatic.  Eyes: Conjunctivae are normal. Pupils are equal, round, and reactive to light.  Cardiovascular: Normal rate, regular rhythm and normal heart sounds.   Pulmonary/Chest: Effort normal and breath sounds normal.  Musculoskeletal: She exhibits no edema.  Neurological: She is alert and oriented to person, place, and time.  Skin: Skin is warm and dry.  Psychiatric: She has a normal mood and affect. Her behavior is normal.           Assessment & Plan:  HTN - uncontrolled but much improved since hospitalization. I would like her to track her blood pressures over the next 2 weeks as her blood pressure readjust to the new regimen. When I see her back we consider increasing her lisinopril. She's had normal renal function as well as potassium levels of this would be reasonable. Or we could even increase the amlodipine to 5 mg twice a day. We will see when I see her back. EKG, chest x-ray and urine culture were negative which is reassuring.

## 2013-06-12 ENCOUNTER — Telehealth: Payer: Self-pay | Admitting: *Deleted

## 2013-06-12 NOTE — Telephone Encounter (Signed)
Pt informed to take 2 tabs of the atenolol daily and to continue to chart her readings. Pt voiced understanding and agreed.Tina Graham [place]

## 2013-06-12 NOTE — Telephone Encounter (Signed)
Pt is calling wanting Dr.Metheney to know about her BP reading she had on Saturday night 216/104 she said she took a 1/2 of an atenolol and her bp went down after a few hours. She wanted to know if Dr. Linford Arnold would call in clonidine for her to [facility] on [address]Tina Graham, [place]

## 2013-06-12 NOTE — Telephone Encounter (Signed)
I would really like to hold off on clonidine because using it frequently connection cause rebound hypertension with blood pressure will come down this in his medication wears off it will go right back up. I would much rather increase the atenolol to a whole tab twice a day. Instead of half a tab which is what she is currently taking. And we can see how her blood pressures are doing on that regimen.

## 2013-06-22 ENCOUNTER — Encounter: Payer: Self-pay | Admitting: Family Medicine

## 2013-06-22 ENCOUNTER — Ambulatory Visit (INDEPENDENT_AMBULATORY_CARE_PROVIDER_SITE_OTHER): Payer: Medicare Other | Admitting: Family Medicine

## 2013-06-22 VITALS — BP 150/67 | HR 71 | Temp 97.6°F | Wt 123.0 lb

## 2013-06-22 DIAGNOSIS — I10 Essential (primary) hypertension: Secondary | ICD-10-CM

## 2013-06-22 MED ORDER — AMLODIPINE BESYLATE 2.5 MG PO TABS: 2.5000 mg | ORAL_TABLET | Freq: Two times a day (BID) | ORAL | Status: DC

## 2013-06-22 MED ORDER — ATENOLOL 100 MG PO TABS: 100.0000 mg | ORAL_TABLET | Freq: Two times a day (BID) | ORAL | Status: DC

## 2013-06-22 NOTE — Progress Notes (Signed)
  Subjective:    Patient ID: Tina Graham, female    DOB: 12-09-1928, 79 y.o.   MRN: [id number]  HPI  HTN- brought in home BP log.  Most of the blood pressures ranging in the 130s to 140s. She has a few that go as high as the 180s. One day she had a blood pressure 216/104and upon repeat 205/96. She called the office about a week ago because of elevated blood pressures and had encouraged her to increase her atenolol to a whole tab twice a day. She's been tolerating that well without any side effects or problems. Her blood pressures overall look much better. She's had a few highs in the 150s but overall they appear to be well-controlled. She's been taking her medications at 1:00 in the afternoon and then again at 5:00.  Review of Systems     Objective:   Physical Exam  Constitutional: She is oriented to person, place, and time. She appears well-developed and well-nourished.  HENT:  Head: Normocephalic and atraumatic.  Cardiovascular: Normal rate, regular rhythm and normal heart sounds.   Pulmonary/Chest: Effort normal and breath sounds normal.  Neurological: She is alert and oriented to person, place, and time.  Skin: Skin is warm and dry.  Psychiatric: She has a normal mood and affect. Her behavior is normal.          Assessment & Plan:  HTN- well controlled today. Her new regimen is working well but I would like her to space the medications. I wrote a plan for her to take her morning medications a.m., lisinopril at 1:00, and her evening meds between 6 and 7 PM. I would like for her to try this for least 5 days to see if it sounds as though her pressures. Her blood pressures overall to do look better in the morning and her more elevated in the evenings. Continue watch salt intake.

## 2013-06-22 NOTE — Patient Instructions (Signed)
Atenolol and the amlodopine at 8AM Take the lisinopril at 1PM (afternoon) Atenolol and the amlodipine at [facility]

## 2013-07-11 ENCOUNTER — Other Ambulatory Visit: Payer: Self-pay | Admitting: *Deleted

## 2013-07-11 MED ORDER — LEVOTHYROXINE SODIUM 100 MCG PO TABS: 100.0000 ug | ORAL_TABLET | Freq: Every day | ORAL | Status: DC

## 2013-08-24 ENCOUNTER — Encounter: Payer: Self-pay | Admitting: Family Medicine

## 2013-08-24 ENCOUNTER — Ambulatory Visit (INDEPENDENT_AMBULATORY_CARE_PROVIDER_SITE_OTHER): Payer: Medicare HMO | Admitting: Family Medicine

## 2013-08-24 VITALS — BP 132/73 | HR 65 | Temp 97.4°F | Wt 124.0 lb

## 2013-08-24 DIAGNOSIS — E039 Hypothyroidism, unspecified: Secondary | ICD-10-CM

## 2013-08-24 DIAGNOSIS — I10 Essential (primary) hypertension: Secondary | ICD-10-CM

## 2013-08-24 DIAGNOSIS — E559 Vitamin D deficiency, unspecified: Secondary | ICD-10-CM

## 2013-08-24 NOTE — Progress Notes (Signed)
   Subjective:    Patient ID: Tina Graham, female    DOB: 09/30/1928, 80 y.o.   MRN: [id number]  HPI  Pt denies chest pain, SOB, dizziness, or heart palpitations.  Taking meds as directed w/o problems.  Denies medication side effects. Has been walking for exercise and feels her BP looks better. Home BPs runnning in the 130-140. Nightime pressure ruuning 140-160s.  Highest was 176.  She still feels like her BP is runign too high at night so has been trying different ways to take her BP.  Right now taking her atenolol at noon and then 3PM.  Taking lisinopril in AM and the norvasc BID  Hypothyroid - no skin or hair or weight changes.  Taking med as Rx.   Vit D def - has been takig supplement for one month. Wants to see iif her numbers are improving.  Review of Systems     Objective:   Physical Exam  Constitutional: She is oriented to person, place, and time. She appears well-developed and well-nourished.  HENT:  Head: Normocephalic and atraumatic.  Neck: Neck supple. No thyromegaly present.  Cardiovascular: Normal rate, regular rhythm and normal heart sounds.   Pulmonary/Chest: Effort normal and breath sounds normal.  Lymphadenopathy:    She has no cervical adenopathy.  Neurological: She is alert and oriented to person, place, and time.  Skin: Skin is warm and dry.  Psychiatric: She has a normal mood and affect. Her behavior is normal.          Assessment & Plan:  HTN- well controlled. Move alodipine to lunch time or early afternoon(2 tabs at same time for 5mg ).  Use lisnopril in AM.  Keep walking since that seems to make a difference in her evening pressure.  She is doing a great job with low salt diet. F/U in one month. Continue to track BPS.  Due for CMP or lipids.   Vit D def - recheck vit D level.   Hypothyroid - recheck TSH level.    Hyperlipidemia - due to rechecklipoids.  Takes fish oil.

## 2013-08-25 ENCOUNTER — Other Ambulatory Visit: Payer: Self-pay | Admitting: Family Medicine

## 2013-08-25 ENCOUNTER — Encounter: Payer: Self-pay | Admitting: Family Medicine

## 2013-08-25 LAB — LIPID PANEL
CHOLESTEROL: 245 mg/dL — AB (ref 0–200)
HDL: 69 mg/dL (ref >39)
LDL Cholesterol: 153 mg/dL — ABNORMAL HIGH (ref 0–99)
Total CHOL/HDL Ratio: 3.6 Ratio
Triglycerides: 114 mg/dL (ref <150)
VLDL: 23 mg/dL (ref 0–40)

## 2013-08-25 LAB — COMPLETE METABOLIC PANEL WITH GFR
ALT: 14 U/L (ref 0–35)
AST: 18 U/L (ref 0–37)
Albumin: 4.6 g/dL (ref 3.5–5.2)
Alkaline Phosphatase: 107 U/L (ref 39–117)
BILIRUBIN TOTAL: 1.2 mg/dL (ref 0.3–1.2)
BUN: 22 mg/dL (ref 6–23)
CO2: 28 meq/L (ref 19–32)
CREATININE: 0.88 mg/dL (ref 0.50–1.10)
Calcium: 10.4 mg/dL (ref 8.4–10.5)
Chloride: 101 mEq/L (ref 96–112)
GFR, EST AFRICAN AMERICAN: 71 mL/min
GFR, EST NON AFRICAN AMERICAN: 61 mL/min
GLUCOSE: 97 mg/dL (ref 70–99)
Potassium: 4.6 mEq/L (ref 3.5–5.3)
SODIUM: 138 meq/L (ref 135–145)
TOTAL PROTEIN: 7.4 g/dL (ref 6.0–8.3)

## 2013-08-25 LAB — VITAMIN D 25 HYDROXY (VIT D DEFICIENCY, FRACTURES): Vit D, 25-Hydroxy: 46 ng/mL (ref 30–89)

## 2013-08-25 LAB — TSH: TSH: 2.137 u[IU]/mL (ref 0.350–4.500)

## 2013-09-13 ENCOUNTER — Other Ambulatory Visit: Payer: Self-pay | Admitting: Physician Assistant

## 2013-09-15 ENCOUNTER — Other Ambulatory Visit: Payer: Self-pay | Admitting: *Deleted

## 2013-09-15 MED ORDER — ATENOLOL 100 MG PO TABS: 100.0000 mg | ORAL_TABLET | Freq: Two times a day (BID) | ORAL | Status: DC

## 2013-09-21 ENCOUNTER — Ambulatory Visit: Payer: Medicare HMO | Admitting: Family Medicine

## 2013-10-16 ENCOUNTER — Ambulatory Visit (INDEPENDENT_AMBULATORY_CARE_PROVIDER_SITE_OTHER): Payer: Medicare HMO | Admitting: Family Medicine

## 2013-10-16 ENCOUNTER — Encounter: Payer: Self-pay | Admitting: Family Medicine

## 2013-10-16 VITALS — BP 128/69 | HR 71 | Wt 127.0 lb

## 2013-10-16 DIAGNOSIS — R21 Rash and other nonspecific skin eruption: Secondary | ICD-10-CM

## 2013-10-16 DIAGNOSIS — I10 Essential (primary) hypertension: Secondary | ICD-10-CM

## 2013-10-16 MED ORDER — LOSARTAN POTASSIUM 50 MG PO TABS: 50.0000 mg | ORAL_TABLET | Freq: Every day | ORAL | Status: DC

## 2013-10-16 NOTE — Addendum Note (Signed)
Addended by: Deno EtienneBARKLEY, Nolene [place]ocks L on: 10/16/2013 12:50 PM   Modules accepted: Orders

## 2013-10-16 NOTE — Patient Instructions (Signed)
Stop the lisinopril.   Breakfast:  Take losartan in the AM. Lunch: take 1 atenolol, and 2 amlodipine Evening: Take 1 atenolol  Monitor BP for 3 weeks and then follow up.

## 2013-10-16 NOTE — Progress Notes (Signed)
   Subjective:    Patient ID: Tina Graham, female    DOB: 05/20/1929, 80 y.o.   MRN: [id number]  HPI Hypertension-she's here today because of her evening blood pressures are still been elevated. We did around the dosing her medication. She's currently taking her lisinopril and atenolol in the morning. She's taking her amlodipine at lunch and then taking her atenolol again at bedtime. She gets very anxious when her blood pressure goes up. Sometimes she will even have to take one of her Xanax and it does seem to help bring her blood pressure back down. She brought in her blood pressure log with her here today.  She's also had a couple spots on her low back and one on her right shin. She's not sure if the same thing or not. The ones on her low back has been itchy. She's not been using any over-the-counter ointments or treatments on them.   Review of Systems     Objective:   Physical Exam  Constitutional: She is oriented to person, place, and time. She appears well-developed and well-nourished.  HENT:  Head: Normocephalic and atraumatic.  Cardiovascular: Normal rate, regular rhythm and normal heart sounds.   Pulmonary/Chest: Effort normal and breath sounds normal.  Neurological: She is alert and oriented to person, place, and time.  Skin: Skin is warm and dry.  Pink rounded lesions on low back and right anterior shin. Approximately 1 cm in size with thick white scale. No drainage or discharge.  Psychiatric: She has a normal mood and affect. Her behavior is normal.          Assessment & Plan:  Hypertension-appears to be well-controlled in the mornings but uncontrolled in the evenings. We'll change her lisinopril to losartan since it does tend to usually provide a little bit more blood pressure lowering. I'm going to have her take her worst dose of atenolol at lunch instead of in the morning. Continue amlodipine at lunch time. And then take second dose of atenolol at bedtime.  Rash-KOH  skin scraping performed. We'll call with results once available. Suspect tinea versicolor versus nummular eczema. The lesion on the leg and the back are exactly the same.

## 2013-10-16 NOTE — Addendum Note (Signed)
Addended by: Deno EtienneBARKLEY, Patric Vanpelt L on: 10/16/2013 03:41 PM   Modules accepted: Orders

## 2013-10-17 ENCOUNTER — Other Ambulatory Visit: Payer: Self-pay | Admitting: Family Medicine

## 2013-10-17 MED ORDER — CLOBETASOL PROPIONATE 0.05 % EX CREA
1.0000 | TOPICAL_CREAM | Freq: Every day | CUTANEOUS | Status: DC
Start: 2013-10-17 — End: 2014-07-23

## 2013-10-18 LAB — KOH PREP: RESULT - KOH: NONE SEEN

## 2013-11-07 ENCOUNTER — Ambulatory Visit (INDEPENDENT_AMBULATORY_CARE_PROVIDER_SITE_OTHER): Payer: Medicare HMO | Admitting: Family Medicine

## 2013-11-07 ENCOUNTER — Encounter: Payer: Self-pay | Admitting: Family Medicine

## 2013-11-07 VITALS — BP 142/71 | HR 63 | Wt 124.0 lb

## 2013-11-07 DIAGNOSIS — L259 Unspecified contact dermatitis, unspecified cause: Secondary | ICD-10-CM

## 2013-11-07 DIAGNOSIS — L309 Dermatitis, unspecified: Secondary | ICD-10-CM

## 2013-11-07 DIAGNOSIS — I10 Essential (primary) hypertension: Secondary | ICD-10-CM | POA: Diagnosis not present

## 2013-11-07 MED ORDER — LISINOPRIL 40 MG PO TABS: 40.0000 mg | ORAL_TABLET | Freq: Every day | ORAL | Status: DC

## 2013-11-07 MED ORDER — AMLODIPINE BESYLATE 5 MG PO TABS: 5.0000 mg | ORAL_TABLET | Freq: Every day | ORAL | Status: DC

## 2013-11-07 NOTE — Progress Notes (Signed)
   Subjective:    Patient ID: Tina Graham, female    DOB: 11/01/1928, 80 y.o.   MRN: [id number]  HPI Didn't want to take the losartan bc of Side effects.  So we called and increased the lisinopril. Take lisinopril 40mg  in AM. Amlodipine 5mg ( she has 2.5mg  so will have to take 2) at lunch. 1 atenolol at lunch and 1 bedtime at bedtime. Brought in BP logs.   Eczema-low back. She does feel like the clobetasol is helping. She wondered how long she had take a break from using it after she uses it for 2 weeks.  Review of Systems     Objective:   Physical Exam  Constitutional: She is oriented to person, place, and time. She appears well-developed and well-nourished.  HENT:  Head: Normocephalic and atraumatic.  Cardiovascular: Normal rate, regular rhythm and normal heart sounds.   Pulmonary/Chest: Effort normal and breath sounds normal.  Neurological: She is alert and oriented to person, place, and time.  Skin: Skin is warm and dry.  Psychiatric: She has a normal mood and affect. Her behavior is normal.          Assessment & Plan:  HTN- Well controlled on new regimen.  Can start checking BP less frequently. She still has a few blood pressures in the 150s 160s that this is typically after being on the phone with her daughter for a couple of hours if she notices that this happens. It definitely seems stress related. Also when goes really high she is able to take a half a tab of her Xanax and her blood pressure tends to respond and come back down. I would like to see her back in 3-4 months to make sure that she's still doing well.  Eczema-recommend take a break from the medication for at least a week between courses. If it's still not improving and resolving after 2 or 3 courses of the medication then recommend skin biopsy for further evaluation.

## 2013-12-15 ENCOUNTER — Ambulatory Visit (INDEPENDENT_AMBULATORY_CARE_PROVIDER_SITE_OTHER): Payer: Medicare HMO | Admitting: Family Medicine

## 2013-12-15 ENCOUNTER — Encounter: Payer: Self-pay | Admitting: Family Medicine

## 2013-12-15 VITALS — BP 172/78 | HR 63 | Wt 126.0 lb

## 2013-12-15 DIAGNOSIS — I10 Essential (primary) hypertension: Secondary | ICD-10-CM

## 2013-12-15 DIAGNOSIS — R55 Syncope and collapse: Secondary | ICD-10-CM

## 2013-12-15 NOTE — Progress Notes (Signed)
Subjective:    Patient ID: Tina Graham, female    DOB: 06/16/1929, 80 y.o.   MRN: [id number]  HPI  hospital D/C summary :  "Tina Graham is a 80 y.o. White or Caucasian [1] female who presented to the [facility] on 12/08/2013. Patient presenting with an episode where she became unresponsive, dropped her coffee cup, and collapsed backward onto the couch staring off in space. It was witnessed by her grandchildren. The time EMS arrived about 5 minutes later her symptoms had resolved and she is back to baseline. She was seen in emergency room in November for a presyncopal event. She also in May 2014 where she apparently also suffered from syncope.  She was admitted to the hospital under observation. Blood pressure monitoring showed no significant episodes of hypoglycemia. Orthostatics were obtained which did not have significant drop in her systolic blood pressure upon standing. She did not have any significant increase in her heart rate upon standing. Echocardiogram did not show any significant ventricular dysfunction or intracardiac clot. MRI showed only aging changes. Carotid study showed only nonobstructive plaque. Patient has had no further syncope, convulsive symptoms, or unresponsive episodes.   At this time think she still be discharged home. I suspect she has syncopal episode. Cannot completely rule out TIA and therefore will recommend baby aspirin daily. Could consider more aggressive lipid-lowering therapy especially for concerned that she truly had an vascular event. However the patient does not want to take statins at this time. I cautioned her to reduce her cholesterol intake as well as to continue her fish oil. If the symptoms continue then can consider cardiac workup for event monitoring her tilt table test. Could also consider neurologic evaluation to rule out complex partial seizures."  The first thing she remembered was waking up with EMS around her.   She had a normal Echo except for diastilic dysfunction.  Had normal carotid dopplers.  Also had normal CT of head excpet for some chronic ischemic changes. She says she hasn't had anymore episodes since then. They started her on ASA 81mg . Taking with food and water.  Also told her to cut her atenolol in half.     Review of Systems No shortness of breath, palpitations, chest pain.  BP 172/78  Pulse 63  Wt 126 lb (57.153 kg)    Allergies  Allergen Reactions  . Simvastatin     REACTION: myalgias    Past Medical History  Diagnosis Date  . Hypothyroidism   . Hyperlipidemia   . Hypertension   . H. pylori infection     treated  . HOH (hard of hearing)     Past Surgical History  Procedure Laterality Date  . Thyroidectomy      partial for goiter  . Cholecystectomy    . Appendectomy      History   Social History  . Marital Status: Widowed    Spouse Name: N/A    Number of Children: 5  . Years of Education: N/A   Occupational History  .     Social History Main Topics  . Smoking status: Former Games developermoker  . Smokeless tobacco: Not on file  . Alcohol Use: No  . Drug Use: No  . Sexual Activity:      Comment: housewife, widowed, lives alone, regular exercise   Other Topics Concern  . Not on file   Social History Narrative  . No narrative on file    Family History  Problem Relation  Age of Onset  . Cancer Daughter   . Heart disease Sister     unknown specifics    Outpatient Encounter Prescriptions as of 12/15/2013  Medication Sig  . ALPRAZolam (XANAX) 1 MG tablet Take 1 tablet (1 mg total) by mouth at bedtime as needed for sleep.  Marland Kitchen. amLODipine (NORVASC) 5 MG tablet Take 1 tablet (5 mg total) by mouth daily.  Marland Kitchen. aspirin EC 81 MG tablet Take 81 mg by mouth.  Marland Kitchen. atenolol (TENORMIN) 100 MG tablet 1/2 tab in AM and whole tab in PM  . Calcium Carbonate-Vitamin D (CALCIUM-VITAMIN D) 500-200 MG-UNIT per tablet Take 1 tablet by mouth daily.  . clobetasol cream (TEMOVATE) 0.05 %  Apply 1 application topically daily. No more than 2 weeks at a time  . levothyroxine (SYNTHROID, LEVOTHROID) 100 MCG tablet Take 1 tablet (100 mcg total) by mouth daily.  Marland Kitchen. lisinopril (PRINIVIL,ZESTRIL) 40 MG tablet Take 1 tablet (40 mg total) by mouth daily.  . Omega-3 Fatty Acids (FISH OIL) 1000 MG CAPS Take 1,000 mg by mouth daily.   . ranitidine (ZANTAC) 150 MG tablet Take 1 tablet (150 mg total) by mouth 2 (two) times daily.  . [DISCONTINUED] atenolol (TENORMIN) 100 MG tablet Take 1 tablet (100 mg total) by mouth 2 (two) times daily.          Objective:   Physical Exam  Constitutional: She is oriented to person, place, and time. She appears well-developed and well-nourished.  HENT:  Head: Normocephalic and atraumatic.  Cardiovascular: Normal rate, regular rhythm and normal heart sounds.   Pulmonary/Chest: Effort normal and breath sounds normal.  Musculoskeletal: She exhibits no edema.  Neurological: She is alert and oriented to person, place, and time.  Skin: Skin is warm and dry.  Psychiatric: She has a normal mood and affect. Her behavior is normal.          Assessment & Plan:  Syncope-it sounds like she may have had a TIA versus a seizure. They recommended that if she has any recurrent episodes to consider evaluation for seizure disorder or heart arrhythmia with a heart monitor. I encouraged her to call me if she starts to have any more events or near syncope events. Or she starts to have problems with dizziness please let me know. I encouraged her to continue with the daily 81 mg aspirin. She plans on buying it from the [employer] from now on as her insurance company was charging her $15 for 90 day supply which she is not happy about. Otherwise she is asymptomatic currently and is doing well on her current regimen. Home blood pressures look well controlled on her current regimen. I did update her medication list to reflect that she is cutting her atenolol in half in the  morning and taking a whole in the evening. She does not want to take a statin.  Hypertension-well-controlled at home. Please see home blood pressure log. She is typically elevated here in the office. Followup in July.

## 2014-02-06 ENCOUNTER — Ambulatory Visit (INDEPENDENT_AMBULATORY_CARE_PROVIDER_SITE_OTHER): Payer: Medicare HMO | Admitting: Family Medicine

## 2014-02-06 ENCOUNTER — Other Ambulatory Visit: Payer: Self-pay | Admitting: Family Medicine

## 2014-02-06 ENCOUNTER — Encounter: Payer: Self-pay | Admitting: Family Medicine

## 2014-02-06 ENCOUNTER — Ambulatory Visit (INDEPENDENT_AMBULATORY_CARE_PROVIDER_SITE_OTHER): Payer: Medicare HMO

## 2014-02-06 VITALS — BP 146/70 | HR 63 | Wt 124.0 lb

## 2014-02-06 DIAGNOSIS — E039 Hypothyroidism, unspecified: Secondary | ICD-10-CM

## 2014-02-06 DIAGNOSIS — Z1231 Encounter for screening mammogram for malignant neoplasm of breast: Secondary | ICD-10-CM

## 2014-02-06 DIAGNOSIS — Z8673 Personal history of transient ischemic attack (TIA), and cerebral infarction without residual deficits: Secondary | ICD-10-CM

## 2014-02-06 DIAGNOSIS — L2089 Other atopic dermatitis: Secondary | ICD-10-CM

## 2014-02-06 DIAGNOSIS — M810 Age-related osteoporosis without current pathological fracture: Secondary | ICD-10-CM

## 2014-02-06 DIAGNOSIS — L209 Atopic dermatitis, unspecified: Secondary | ICD-10-CM

## 2014-02-06 DIAGNOSIS — I10 Essential (primary) hypertension: Secondary | ICD-10-CM

## 2014-02-06 LAB — BASIC METABOLIC PANEL WITH GFR
BUN: 14 mg/dL (ref 6–23)
CALCIUM: 9.9 mg/dL (ref 8.4–10.5)
CO2: 27 meq/L (ref 19–32)
Chloride: 104 mEq/L (ref 96–112)
Creat: 0.92 mg/dL (ref 0.50–1.10)
GFR, EST AFRICAN AMERICAN: 67 mL/min
GFR, EST NON AFRICAN AMERICAN: 58 mL/min — AB
Glucose, Bld: 93 mg/dL (ref 70–99)
Potassium: 4.6 mEq/L (ref 3.5–5.3)
SODIUM: 138 meq/L (ref 135–145)

## 2014-02-06 NOTE — Progress Notes (Signed)
   Subjective:    Patient ID: Tina Graham, female    DOB: 03/05/1929, 80 y.o.   MRN: [id number]  HPI Hypertension- Pt denies chest pain, SOB, dizziness, or heart palpitations.  Taking meds as directed w/o problems.  Denies medication side effects.  She did bring in home blood pressure log. Systolic pressures were mostly running between 106 and 144 at home. Diastolics primarily running in the 60s and 70s.  Osteoporosis-she's not currently on a bisphosphonate but she does take calcium. Her last bone density test was in June of 2012.  Hypothyroidism-no recent changes in weight, skin or hair recently.   Still has a rash on her back. We did a KOH skin scraping It was negative for yeast or fungal elements. She has been using a steroid but not consistently. She says it seems to be better but doesn't seem to be completely resolved. She says it itches from time to time. Because of where it sat on her back she has difficulty putting on by herself.   [facility] and discharged on 5/9. She had a syncopal episode which they felt was secondary to a TIA. They did a brain MRI with no acute findings. They also did an ultrasound of the carotids. No significant stenosis. Head CT was also negative for acute intracranial abnormality but did show some microvascular ischemic changes. Echocardiogram of the heart showed normal left ventricular wall thickness with an ejection fraction of 60-65%. Grade 2 diastolic dysfunction. Review of Systems     Objective:   Physical Exam  Constitutional: She is oriented to person, place, and time. She appears well-developed and well-nourished.  HENT:  Head: Normocephalic and atraumatic.  Cardiovascular: Normal rate, regular rhythm and normal heart sounds.   Pulmonary/Chest: Effort normal and breath sounds normal.  Neurological: She is alert and oriented to person, place, and time.  Skin: Skin is warm and dry.  Psychiatric: She has a  normal mood and affect. Her behavior is normal.          Assessment & Plan:  Hypertension-well-controlled at home based on home numbers. Blood pressure actually looks better here today as well. Due for BMP. Due for followup in 6 months. Continue to track home blood pressures.  Hypothyroidism-due to recheck TSH today. Continue current regimen. Follow every 6 months.  Osteoporosis-due for repeat bone density today. Currently just taking calcium.  Atopic dermatitis-since the skin scraping was negative it should respond over all to steroid cream. He felt like it looks better than it previously did. She also has several large seborrheic keratoses and I wonder if this could even be a source of some of her itching and irritation. She's just not sure if she's not able to see it herself.  History of TIA. She's now taking an aspirin daily. They did adjust some of her blood pressure medications. I believe they decreased her atenolol to half a tab in the morning a hole in the evening.  Due for mammogram as well.

## 2014-02-07 LAB — TSH: TSH: 2.077 u[IU]/mL (ref 0.350–4.500)

## 2014-02-07 NOTE — Progress Notes (Signed)
Quick Note:  All labs are normal. ______ 

## 2014-03-26 ENCOUNTER — Other Ambulatory Visit: Payer: Self-pay | Admitting: Family Medicine

## 2014-05-24 ENCOUNTER — Other Ambulatory Visit: Payer: Self-pay | Admitting: Family Medicine

## 2014-07-23 ENCOUNTER — Encounter: Payer: Self-pay | Admitting: Physician Assistant

## 2014-07-23 ENCOUNTER — Ambulatory Visit (INDEPENDENT_AMBULATORY_CARE_PROVIDER_SITE_OTHER): Payer: Medicare HMO | Admitting: Physician Assistant

## 2014-07-23 VITALS — BP 162/81 | HR 70 | Ht 59.0 in | Wt 124.0 lb

## 2014-07-23 DIAGNOSIS — F411 Generalized anxiety disorder: Secondary | ICD-10-CM | POA: Diagnosis not present

## 2014-07-23 DIAGNOSIS — I10 Essential (primary) hypertension: Secondary | ICD-10-CM | POA: Diagnosis not present

## 2014-07-23 NOTE — Progress Notes (Signed)
   Subjective:    Patient ID: Tina Graham, female    DOB: 11/25/1928, 80 y.o.   MRN: [id number]  HPI  Pt presents to the clinic with elevated blood pressure. Pt has been keeping a log and running higher in evenings mostly 160-178/70-85 HR 65. She denies any CP or SOB. She does get a HA, flushed cheeks, and vision blurriness when is elevated. Taking atenolol, lisinopril, and norvasc daily. Continues to watch salt. She does admit to anxiounss causing BP to increase. She is taking 1/6 of a xanax when needed during the day for anxiety and does help.     Review of Systems  All other systems reviewed and are negative.      Objective:   Physical Exam  Constitutional: She is oriented to person, place, and time. She appears well-developed and well-nourished.  HENT:  Head: Normocephalic and atraumatic.  Cardiovascular: Normal rate, regular rhythm and normal heart sounds.   Pulmonary/Chest: Effort normal and breath sounds normal. She has no wheezes.  Neurological: She is alert and oriented to person, place, and time.  Skin: Skin is dry.  Psychiatric: She has a normal mood and affect. Her behavior is normal.          Assessment & Plan:  Hypertension- discussed goal BP is under 150/90. Continue on lisnipril and atenolol. Will increase norvasc to 10mg  daily. recheck BP in 2 weeks.   Anxiety- could be affecting anxiety. Discussed possibility of daily medication. Pt declines today. Will continue to take 1/6 of xanax as needed for anxiety.

## 2014-07-23 NOTE — Patient Instructions (Signed)
Increase norvasc to 10mg  daily.  Recheck BP in 2 weeks.

## 2014-07-31 ENCOUNTER — Other Ambulatory Visit: Payer: Self-pay | Admitting: Family Medicine

## 2014-08-09 ENCOUNTER — Encounter: Payer: Self-pay | Admitting: Family Medicine

## 2014-08-09 ENCOUNTER — Ambulatory Visit (INDEPENDENT_AMBULATORY_CARE_PROVIDER_SITE_OTHER): Payer: Commercial Managed Care - HMO | Admitting: Family Medicine

## 2014-08-09 VITALS — BP 140/76 | HR 65 | Ht 59.0 in | Wt 123.0 lb

## 2014-08-09 DIAGNOSIS — I10 Essential (primary) hypertension: Secondary | ICD-10-CM

## 2014-08-09 DIAGNOSIS — E785 Hyperlipidemia, unspecified: Secondary | ICD-10-CM

## 2014-08-09 DIAGNOSIS — F411 Generalized anxiety disorder: Secondary | ICD-10-CM | POA: Diagnosis not present

## 2014-08-09 DIAGNOSIS — E039 Hypothyroidism, unspecified: Secondary | ICD-10-CM | POA: Diagnosis not present

## 2014-08-09 LAB — COMPLETE METABOLIC PANEL WITH GFR
ALBUMIN: 4.5 g/dL (ref 3.5–5.2)
ALK PHOS: 98 U/L (ref 39–117)
ALT: 11 U/L (ref 0–35)
AST: 17 U/L (ref 0–37)
BUN: 19 mg/dL (ref 6–23)
CO2: 27 mEq/L (ref 19–32)
CREATININE: 1.01 mg/dL (ref 0.50–1.10)
Calcium: 10.2 mg/dL (ref 8.4–10.5)
Chloride: 101 mEq/L (ref 96–112)
GFR, Est African American: 59 mL/min — ABNORMAL LOW
GFR, Est Non African American: 52 mL/min — ABNORMAL LOW
GLUCOSE: 92 mg/dL (ref 70–99)
Potassium: 4.3 mEq/L (ref 3.5–5.3)
Sodium: 138 mEq/L (ref 135–145)
TOTAL PROTEIN: 7.3 g/dL (ref 6.0–8.3)
Total Bilirubin: 1.2 mg/dL (ref 0.2–1.2)

## 2014-08-09 LAB — TSH: TSH: 5.867 u[IU]/mL — AB (ref 0.350–4.500)

## 2014-08-09 LAB — LIPID PANEL
Cholesterol: 251 mg/dL — ABNORMAL HIGH (ref 0–200)
HDL: 72 mg/dL (ref >39)
LDL Cholesterol: 152 mg/dL — ABNORMAL HIGH (ref 0–99)
TRIGLYCERIDES: 135 mg/dL (ref <150)
Total CHOL/HDL Ratio: 3.5 Ratio
VLDL: 27 mg/dL (ref 0–40)

## 2014-08-09 MED ORDER — ALPRAZOLAM 1 MG PO TABS: 1.0000 mg | ORAL_TABLET | Freq: Every evening | ORAL | Status: DC | PRN

## 2014-08-09 NOTE — Progress Notes (Signed)
   Subjective:    Patient ID: Tina BoozerMuriel Richert, female    DOB: 11/27/1928, 81 y.o.   MRN: [id number]  HPI Hypertension- Pt denies chest pain, SOB, dizziness, or heart palpitations.  Taking meds as directed w/o problems.  Denies medication side effects.  She was seen about 2 weeks ago and her amlodipine was increased to 10 mg. She is following up today to recheck blood pressure. She's also on lisinopril 40 mg and atenolol 100 mg. Brought in log of home blood pressures. Over the last several days they have been running in the 120-140 range in general. Highest systolic pressure was 158. But it was 110 earlier that day. Pulses remain primarily in the 60s. Though upon questioning her it is very unclear whether not she is actually taking a full 10 mg of amlodipine. She notices is uses a "sliver" of xanax that brings her pressure down. She has noticed some more ankle swelling after inc her amlodipine.   Hypothyroidism-no recent skin or hair changes. No recent changes to weight. She's currently taking levothyroxin 100 g daily.  Anxiety - does request refill on her xanax today. She uses very sparingly .Still has about 10 tabs left.   Hyperlipidemia-not currently on medication. She does have an intolerance to simvastatin and did not want to take a different statin. Review of Systems     Objective:   Physical Exam  Constitutional: She is oriented to person, place, and time. She appears well-developed and well-nourished.  HENT:  Head: Normocephalic and atraumatic.  Neck: Neck supple. No thyromegaly present.  Cardiovascular: Normal rate, regular rhythm and normal heart sounds.   Pulmonary/Chest: Effort normal and breath sounds normal.  Lymphadenopathy:    She has no cervical adenopathy.  Neurological: She is alert and oriented to person, place, and time.  Skin: Skin is warm and dry.  Psychiatric: She has a normal mood and affect. Her behavior is normal.          Assessment & Plan:  Hypertension-  Home BPs are well controlled so will keep amlodpine at 10mg   follow-up in 3 months:   -Take the atenolol 1/2 tab in morning   -Lisinopril 40mg  and the amlodipine 10mg   at lunch   -Take the other 1 of the atenolol in the evening.    Hypothyroidism- due to recheck thyroid.  She is euthyroid. If lab work looks great and will continue current dose and recheck in one year.  Hyperlipidemia- due to recheck cholesterol.  Will call with results when done.    Generalized anxiety-I did refill her Xanax. She does use it very sparingly and usually just takes a small sliver of the pill.

## 2014-08-09 NOTE — Patient Instructions (Addendum)
  Take the atenolol 1/2 tab in morning  Lisinopril 40mg  and the amlodipine 10mg   at lunch  Taken the other 1 of the atenolol at evening.

## 2014-08-10 ENCOUNTER — Other Ambulatory Visit: Payer: Self-pay | Admitting: Family Medicine

## 2014-08-10 MED ORDER — LEVOTHYROXINE SODIUM 112 MCG PO TABS: 112.0000 ug | ORAL_TABLET | Freq: Every day | ORAL | Status: DC

## 2014-08-20 ENCOUNTER — Other Ambulatory Visit: Payer: Self-pay | Admitting: *Deleted

## 2014-08-21 MED ORDER — RANITIDINE HCL 150 MG PO TABS: 150.0000 mg | ORAL_TABLET | Freq: Two times a day (BID) | ORAL | Status: DC

## 2014-08-21 MED ORDER — LISINOPRIL 40 MG PO TABS: 40.0000 mg | ORAL_TABLET | Freq: Every day | ORAL | Status: DC

## 2014-08-23 ENCOUNTER — Other Ambulatory Visit: Payer: Self-pay | Admitting: *Deleted

## 2014-08-23 MED ORDER — AMLODIPINE BESYLATE 5 MG PO TABS: 5.0000 mg | ORAL_TABLET | Freq: Every day | ORAL | Status: DC

## 2014-09-04 ENCOUNTER — Ambulatory Visit (INDEPENDENT_AMBULATORY_CARE_PROVIDER_SITE_OTHER): Payer: Medicare HMO | Admitting: Family Medicine

## 2014-09-04 ENCOUNTER — Encounter: Payer: Self-pay | Admitting: Family Medicine

## 2014-09-04 VITALS — BP 132/67 | HR 69 | Wt 126.0 lb

## 2014-09-04 DIAGNOSIS — T887XXA Unspecified adverse effect of drug or medicament, initial encounter: Secondary | ICD-10-CM

## 2014-09-04 DIAGNOSIS — E039 Hypothyroidism, unspecified: Secondary | ICD-10-CM

## 2014-09-04 DIAGNOSIS — I10 Essential (primary) hypertension: Secondary | ICD-10-CM | POA: Diagnosis not present

## 2014-09-04 DIAGNOSIS — R600 Localized edema: Secondary | ICD-10-CM

## 2014-09-04 DIAGNOSIS — T50905A Adverse effect of unspecified drugs, medicaments and biological substances, initial encounter: Secondary | ICD-10-CM

## 2014-09-04 MED ORDER — AMLODIPINE BESYLATE 5 MG PO TABS: 5.0000 mg | ORAL_TABLET | Freq: Every day | ORAL | Status: DC

## 2014-09-04 NOTE — Progress Notes (Signed)
   Subjective:    Patient ID: Tina Graham, female    DOB: 10/09/1928, 81 y.o.   MRN: [id number]  HPI 81 year old female with a history hypertension comes in today complaining of extremity edema. Her amlodipine was increased to 10 mg about 2 months ago. She was doing well for the first month but after that she started noticing some swelling in her hands and her feet. It does tend to come and go. She says yesterday the swelling was up to her shins and her hands felt like sausages. She also has noticed that her face is been more flush since increased dose of medication. Her blood pressures have been better controlled. She also had prompting the new prescription for the 10 mg dose of the amlodipine.  No CP or SOB.   Hypothyroid-she did start the new dose of thyroid medication about 3 weeks ago. She has been asymptomatic with it. I do want to recheck it before we refill a 90 day supply of her medication.  Review of Systems     Objective:   Physical Exam  Constitutional: She is oriented to person, place, and time. She appears well-developed and well-nourished.  HENT:  Head: Normocephalic and atraumatic.  Cardiovascular: Normal rate, regular rhythm and normal heart sounds.   Pulmonary/Chest: Effort normal and breath sounds normal.  Musculoskeletal:  Trace edema of feet bilat and of hands bilat. Face is flushed today.   Neurological: She is alert and oriented to person, place, and time.  Skin: Skin is warm and dry. No rash noted.  Psychiatric: She has a normal mood and affect. Her behavior is normal.          Assessment & Plan:  Edema-secondary to amlodipine. We'll decrease her dose back down to 5 mg which she tolerated very well. Expect that her symptoms were resolved over the next week with dose reduction. Call if not resolving and improving.  Hypertension-she said she would like to take a half of an extra dose of amlodipine in the evenings her blood pressures are high and just use it as  needed. I think this is reasonable that she's using it frequently then we will have to come up with an alternate plan to keep her BPs under consistant control.   Hypothyroid-Adjusted dose 3 weeks ago. . I do want to recheck it before we refill a 90 day supply of her medication.

## 2014-10-01 ENCOUNTER — Encounter: Payer: Self-pay | Admitting: Family Medicine

## 2014-10-01 ENCOUNTER — Ambulatory Visit (INDEPENDENT_AMBULATORY_CARE_PROVIDER_SITE_OTHER): Payer: Commercial Managed Care - HMO | Admitting: Family Medicine

## 2014-10-01 VITALS — BP 170/84 | HR 71 | Wt 126.0 lb

## 2014-10-01 DIAGNOSIS — E038 Other specified hypothyroidism: Secondary | ICD-10-CM

## 2014-10-01 DIAGNOSIS — I10 Essential (primary) hypertension: Secondary | ICD-10-CM | POA: Diagnosis not present

## 2014-10-01 MED ORDER — LOSARTAN POTASSIUM 100 MG PO TABS: 100.0000 mg | ORAL_TABLET | Freq: Every day | ORAL | Status: DC

## 2014-10-01 MED ORDER — METOPROLOL TARTRATE 100 MG PO TABS: 100.0000 mg | ORAL_TABLET | Freq: Two times a day (BID) | ORAL | Status: DC

## 2014-10-01 NOTE — Patient Instructions (Addendum)
We are changing the atenolol to metoprolol We are changing the lisinopril to losartan.  Keep taking the amlodipine around lunch time.

## 2014-10-01 NOTE — Progress Notes (Signed)
   Subjective:    Patient ID: Tina Graham, female    DOB: 07/17/1929, 81 y.o.   MRN: [id number]  HPI  81 year old female with a history of hypertension. Last seen about a month ago at that time she was fairly well controlled. She comes in today because her evening blood pressures have been running consistently elevated. She did bring in a blood pressure log with her.  -Take the atenolol 1/2 tab in morning  -Lisinopril 40mg  and the amlodipine 5 mg at lunch  -Take the other 1 of the atenolol in the evening.    Hypothyroidism-we increased her dose about 6 weeks ago to 112 g. No recent skin or hair changes or abnormal weight loss.  Review of Systems     Objective:   Physical Exam  Constitutional: She is oriented to person, place, and time. She appears well-developed and well-nourished.  HENT:  Head: Normocephalic and atraumatic.  Cardiovascular: Normal rate, regular rhythm and normal heart sounds.   Pulmonary/Chest: Effort normal and breath sounds normal.  Neurological: She is alert and oriented to person, place, and time.  Skin: Skin is warm and dry.  Psychiatric: She has a normal mood and affect. Her behavior is normal.          Assessment & Plan:  Hypothyroid - will recheck TSH. We adjusted her dose about 6 weeks ago. Due to recheck to make sure that we do not need to adjust it before her next 90 day refill.  HTN - afternoon and evening blood pressures are still running high. I encouraged her not to check her blood pressure so frequently. She will literally check her pressure up to 12 times in one day and I discussed with her that sometimes this can cause increased anxiety which connection make her blood pressure go up. We will try switching her atenolol to metoprolol and switching her lisinopril to losartan and see if this make the difference as well. Continue the amlodipine.

## 2014-10-02 LAB — TSH: TSH: 2.233 u[IU]/mL (ref 0.350–4.500)

## 2014-10-15 DIAGNOSIS — H524 Presbyopia: Secondary | ICD-10-CM | POA: Diagnosis not present

## 2014-10-15 DIAGNOSIS — H5210 Myopia, unspecified eye: Secondary | ICD-10-CM | POA: Diagnosis not present

## 2014-10-16 ENCOUNTER — Other Ambulatory Visit: Payer: Self-pay

## 2014-10-16 MED ORDER — LEVOTHYROXINE SODIUM 112 MCG PO TABS: 112.0000 ug | ORAL_TABLET | Freq: Every day | ORAL | Status: DC

## 2014-11-19 ENCOUNTER — Other Ambulatory Visit: Payer: Self-pay | Admitting: Family Medicine

## 2014-11-19 ENCOUNTER — Ambulatory Visit (INDEPENDENT_AMBULATORY_CARE_PROVIDER_SITE_OTHER): Payer: Medicare HMO | Admitting: Family Medicine

## 2014-11-19 ENCOUNTER — Encounter: Payer: Self-pay | Admitting: Family Medicine

## 2014-11-19 VITALS — BP 148/76 | HR 98 | Ht 59.0 in | Wt 124.0 lb

## 2014-11-19 DIAGNOSIS — L293 Anogenital pruritus, unspecified: Secondary | ICD-10-CM | POA: Diagnosis not present

## 2014-11-19 DIAGNOSIS — L298 Other pruritus: Secondary | ICD-10-CM | POA: Diagnosis not present

## 2014-11-19 DIAGNOSIS — I10 Essential (primary) hypertension: Secondary | ICD-10-CM | POA: Diagnosis not present

## 2014-11-19 DIAGNOSIS — R319 Hematuria, unspecified: Secondary | ICD-10-CM | POA: Diagnosis not present

## 2014-11-19 DIAGNOSIS — N898 Other specified noninflammatory disorders of vagina: Secondary | ICD-10-CM

## 2014-11-19 LAB — WET PREP FOR TRICH, YEAST, CLUE
Clue Cells Wet Prep HPF POC: NONE SEEN
Trich, Wet Prep: NONE SEEN
Yeast Wet Prep HPF POC: NONE SEEN

## 2014-11-19 LAB — POCT URINALYSIS DIPSTICK
BILIRUBIN UA: NEGATIVE
GLUCOSE UA: NEGATIVE
KETONES UA: NEGATIVE
Nitrite, UA: NEGATIVE
SPEC GRAV UA: 1.025
Urobilinogen, UA: 0.2
pH, UA: 5.5

## 2014-11-19 LAB — POCT GLYCOSYLATED HEMOGLOBIN (HGB A1C): Hemoglobin A1C: 5.7

## 2014-11-19 MED ORDER — FLUCONAZOLE 150 MG PO TABS: 150.0000 mg | ORAL_TABLET | Freq: Once | ORAL | Status: DC

## 2014-11-19 NOTE — Patient Instructions (Signed)
9 AM - Take the metoprolol 1 tab in morning and Losartan 100 mg   Noon- amlodipine 5 mg at lunch  4PM - Take the other 1 of the metoprolol in the evening.

## 2014-11-19 NOTE — Progress Notes (Signed)
   Subjective:    Patient ID: Tina Graham, female    DOB: 07/04/1929, 81 y.o.   MRN: [id number]  HPI Hypertension- Pt denies chest pain, SOB, dizziness, or heart palpitations.  Taking meds as directed w/o problems.  Denies medication side effects.  Brought in home log of BPs.  She was still having some elevated blood pressures in the evenings. But she was also checking her blood pressure excessively, up to 12 times a day. We decided to switch her lisinopril to losartan and change her atenolol to metoprolol. She typically takes her blood pressure in the morning, been around 2:00 in the afternoon, 7 PM, and the right before bedtime.  9 AM - Take the metoprolol 1 tab in morning  Noon- Losartan 100 mg and the amlodipine 5 mg at lunch  4PM - Take the other 1 of the metoprolol in the evening.   She is also having some vaginal sxs.  Has had some intermittant itching  X 1 months.  Has had a small bump she is putting yougurt on it and that has helped.     Review of Systems     Objective:   Physical Exam  Constitutional: She is oriented to person, place, and time. She appears well-developed and well-nourished.  HENT:  Head: Normocephalic and atraumatic.  Cardiovascular: Normal rate, regular rhythm and normal heart sounds.   Pulmonary/Chest: Effort normal and breath sounds normal.  Neurological: She is alert and oriented to person, place, and time.  Skin: Skin is warm and dry.  Psychiatric: She has a normal mood and affect. Her behavior is normal.          Assessment & Plan:  HTN -  Not maximally controlled. We'll try moving her losartan from noon to 9 AM. And see if this helps her have more well-controlled blood pressures in the afternoon and early evening.  9 AM - Take the metoprolol 1 tab in morning and Losartan 100 mg   Noon- amlodipine 5 mg at lunch  4PM - Take the other 1 of the metoprolol in the evening.   Vaginitis-what prep collected. We also did a urinalysis which  showed some microscopic blood. We will send for urine culture as well. If it's negative then recommend repeat urinalysis to confirm absence of blood.

## 2014-11-20 LAB — URINE CULTURE
Colony Count: NO GROWTH
Organism ID, Bacteria: NO GROWTH

## 2014-11-28 ENCOUNTER — Telehealth: Payer: Self-pay | Admitting: *Deleted

## 2014-11-28 NOTE — Telephone Encounter (Signed)
Pt called and stated that she got rx of fluconazole from her mail order and wanted to know if she should take it. I advised that if she has already taken the one that was sent to her local pharmacy to hold on to this one in case she begins to experience the sxs that she was having before. Pt voiced understanding and agreed.Heath GoldBarkley, Richard Holz Lynetta'

## 2015-01-02 ENCOUNTER — Encounter: Payer: Self-pay | Admitting: Family Medicine

## 2015-01-02 ENCOUNTER — Ambulatory Visit (INDEPENDENT_AMBULATORY_CARE_PROVIDER_SITE_OTHER): Payer: Commercial Managed Care - HMO | Admitting: Family Medicine

## 2015-01-02 VITALS — BP 130/82 | HR 62 | Ht 59.0 in | Wt 124.0 lb

## 2015-01-02 DIAGNOSIS — R21 Rash and other nonspecific skin eruption: Secondary | ICD-10-CM

## 2015-01-02 DIAGNOSIS — I10 Essential (primary) hypertension: Secondary | ICD-10-CM

## 2015-01-02 DIAGNOSIS — E039 Hypothyroidism, unspecified: Secondary | ICD-10-CM | POA: Diagnosis not present

## 2015-01-02 NOTE — Progress Notes (Signed)
   Subjective:    Patient ID: Tina Graham, female    DOB: 11/09/1928, 81 y.o.   MRN: [id number]  HPI Hypertension- Pt denies chest pain, SOB, dizziness, or heart palpitations.  Taking meds as directed w/o problems.  Denies medication side effects.  She did bring in her blood pressure log. She will often check it up to 4-5 times per day. She still has an occasional blood pressure that runs in the 150s in the evening. The most of them when she repeated the pressure 5 minutes later it came back down under 150.  Rash on right medial foot @ arch unsure of how long it has been there. Maybe a few weeks.  Feels like getting bigger. Thinks that it came from the insole of her shoe rubbing against it and she used neosporin  She also wants and if she can use the imiquimod cream on some places on her scalp. She says they will raise up and get crusty and then she just scratches them off. She has a scab in one area on the scalp right now.  Hypothyroid-no recent skin or hair changes. No recent weight changes. Taking her medication regularly.  Review of Systems     Objective:   Physical Exam  Constitutional: She is oriented to person, place, and time. She appears well-developed and well-nourished.  HENT:  Head: Normocephalic and atraumatic.  Cardiovascular: Normal rate, regular rhythm and normal heart sounds.   Pulmonary/Chest: Effort normal and breath sounds normal.  Neurological: She is alert and oriented to person, place, and time.  Skin: Skin is warm and dry.  Circular red rash with thick scale on the left inner foot. Smaller lesion about 2 cm on the arch.    Psychiatric: She has a normal mood and affect. Her behavior is normal.    She has a scab on the top of the scalp near the part in her hair.      Assessment & Plan:  HTN - well-controlled well-controlled here today. Most BPs are well controlled. A few in the 150s. Follow-up in 4 months.  Rash, foot - kOH done. Will cal with results  available.  Suspect tinea pedis.  Okay to use the imiquimod cream on the lesions on her scalp. Suspect either a actinic keratoses or possibly seborrheic keratoses.  Hypothyroidism-due to recheck TSH.

## 2015-01-03 ENCOUNTER — Other Ambulatory Visit: Payer: Self-pay | Admitting: *Deleted

## 2015-01-03 DIAGNOSIS — E039 Hypothyroidism, unspecified: Secondary | ICD-10-CM

## 2015-01-03 LAB — TSH: TSH: 5.039 u[IU]/mL — ABNORMAL HIGH (ref 0.350–4.500)

## 2015-01-03 LAB — BASIC METABOLIC PANEL
BUN: 15 mg/dL (ref 6–23)
CO2: 23 mEq/L (ref 19–32)
Calcium: 9.6 mg/dL (ref 8.4–10.5)
Chloride: 102 mEq/L (ref 96–112)
Creat: 0.84 mg/dL (ref 0.50–1.10)
Glucose, Bld: 99 mg/dL (ref 70–99)
Potassium: 4.6 mEq/L (ref 3.5–5.3)
SODIUM: 139 meq/L (ref 135–145)

## 2015-01-03 LAB — KOH PREP: RESULT - KOH: NONE SEEN

## 2015-01-03 MED ORDER — BETAMETHASONE DIPROPIONATE 0.05 % EX CREA: TOPICAL_CREAM | Freq: Every day | CUTANEOUS | Status: DC | PRN

## 2015-01-03 MED ORDER — LEVOTHYROXINE SODIUM 125 MCG PO TABS: 125.0000 ug | ORAL_TABLET | Freq: Every day | ORAL | Status: DC

## 2015-01-03 NOTE — Addendum Note (Signed)
Addended by: Nani GasserMETHENEY, CATHERINE D on: 01/03/2015 05:32 PM   Modules accepted: Orders

## 2015-01-03 NOTE — Addendum Note (Signed)
Addended by: Nani GasserMETHENEY, Violanda Bobeck D on: 01/03/2015 11:48 AM   Modules accepted: Orders

## 2015-01-21 ENCOUNTER — Ambulatory Visit (INDEPENDENT_AMBULATORY_CARE_PROVIDER_SITE_OTHER): Payer: Commercial Managed Care - HMO | Admitting: Family Medicine

## 2015-01-21 ENCOUNTER — Encounter: Payer: Self-pay | Admitting: Family Medicine

## 2015-01-21 VITALS — BP 144/73 | HR 66 | Wt 126.0 lb

## 2015-01-21 DIAGNOSIS — E039 Hypothyroidism, unspecified: Secondary | ICD-10-CM | POA: Diagnosis not present

## 2015-01-21 DIAGNOSIS — I10 Essential (primary) hypertension: Secondary | ICD-10-CM | POA: Diagnosis not present

## 2015-01-21 NOTE — Patient Instructions (Signed)
9 AM - Take the metoprolol 1 tab in morning and Losartan 100 mg   Noon- amlodipine 5 mg ( 2 tabs for total of 10mg ) at lunch  4PM - Take the other 1 of the metoprolol in the evening.

## 2015-01-21 NOTE — Progress Notes (Signed)
   Subjective:    Patient ID: Tina Graham, female    DOB: Oct 24, 1928, 81 y.o.   MRN: [id number]  HPI Hypertension- Pt denies chest pain, SOB, dizziness, or heart palpitations.  Taking meds as directed w/o problems.  Denies medication side effects.  Home BP log shows most BP look good.  Ran out of metoprolol and took her old atenolol and felt her BP was better at controlling her BP   9 AM - Take the metoprolol 1 tab in morning and Losartan 100 mg   Noon- amlodipine 5 mg at lunch  4PM - Take the other 1 of the metoprolol in the evening.   Hypothyroidism-we changed her thyroid medication about 2 weeks ago. We increased her dose as her TSH was above 5. She's not noticed any difference. She doesn't really complain of any skin or hair changes.  Review of Systems     Objective:   Physical Exam  Constitutional: She is oriented to person, place, and time. She appears well-developed and well-nourished.  HENT:  Head: Normocephalic and atraumatic.  Cardiovascular: Normal rate, regular rhythm and normal heart sounds.   Pulmonary/Chest: Effort normal and breath sounds normal.  Neurological: She is alert and oriented to person, place, and time.  Skin: Skin is warm and dry.  Psychiatric: She has a normal mood and affect. Her behavior is normal.          Assessment & Plan:  HTN -  Will try increasing the amlodipine at lunch time to see if this will help with the evening blood pressures since the drug typically peaks between 6-12 hours. I did encourage her to monitor for any lower sternum the swelling which can sometimes happen on higher doses of the medication. She will follow-up in October.  Hypothyroid - due to recheck level next monht as we adjusted her dose 4 weeks ago.  Then can send refills to mail-order.

## 2015-02-06 ENCOUNTER — Telehealth: Payer: Self-pay | Admitting: *Deleted

## 2015-02-06 NOTE — Telephone Encounter (Signed)
Ok to send nre Rx fof the 2.5 or she can just take 1 and 1/2 of the .

## 2015-02-06 NOTE — Telephone Encounter (Signed)
Pt called and stated that she began taking the new bp meds and she got in to a "daze" she felt like she had taken an overdose. So she began taking the older rx which was Amlodipine 2.5mg   And 5mg . She  Stated that she is feeling much better since doing this. She told me that her bps have been around 140/70 most days. She would like to get a rx for a 7.5 mg amlodipine. I informed her that this does not come in a 7.5 mg. She stated that she still has refills left on the 5mg  tabs and would like a new rx for 2.5mg  ([facility]).  I told her that I would forward this to Dr. Linford ArnoldMetheney for f/u and call her back with any recommendations.Loralee PacasBarkley, Geneive Sandstrom [facility]

## 2015-02-07 MED ORDER — AMLODIPINE BESYLATE 2.5 MG PO TABS: 2.5000 mg | ORAL_TABLET | Freq: Every day | ORAL | Status: DC

## 2015-02-07 NOTE — Telephone Encounter (Signed)
Pt informed. She has 2 refills of the 5 mg tabs left. She wanted to know if we would discard her old meds. I told her that she could take them to the pharmacy or bring them in.Loralee PacasBarkley, Sherissa Tenenbaum [place]

## 2015-02-26 DIAGNOSIS — E039 Hypothyroidism, unspecified: Secondary | ICD-10-CM | POA: Diagnosis not present

## 2015-02-27 LAB — TSH: TSH: 2.881 u[IU]/mL (ref 0.350–4.500)

## 2015-03-04 ENCOUNTER — Other Ambulatory Visit: Payer: Self-pay | Admitting: Family Medicine

## 2015-04-22 ENCOUNTER — Ambulatory Visit: Payer: Medicare HMO | Admitting: Family Medicine

## 2015-05-06 ENCOUNTER — Ambulatory Visit (INDEPENDENT_AMBULATORY_CARE_PROVIDER_SITE_OTHER): Payer: Commercial Managed Care - HMO | Admitting: Family Medicine

## 2015-05-06 ENCOUNTER — Encounter: Payer: Self-pay | Admitting: Family Medicine

## 2015-05-06 VITALS — BP 150/62 | HR 61 | Wt 124.0 lb

## 2015-05-06 DIAGNOSIS — I10 Essential (primary) hypertension: Secondary | ICD-10-CM

## 2015-05-06 DIAGNOSIS — Z23 Encounter for immunization: Secondary | ICD-10-CM | POA: Diagnosis not present

## 2015-05-06 DIAGNOSIS — Z1231 Encounter for screening mammogram for malignant neoplasm of breast: Secondary | ICD-10-CM

## 2015-05-06 NOTE — Patient Instructions (Signed)
9 AM - Take the metoprolol 1 tab in morning and Losartan 100 mg   Noon- amlodipine 5 mg  + 2.5mg  at lunch  5 PM - Take the other 1 of the metoprolol in the evening

## 2015-05-06 NOTE — Progress Notes (Signed)
   Subjective:    Patient ID: Tina Graham, female    DOB: 1928-09-09, 81 y.o.   MRN: [id number]  HPI F/U HTN - - she is concerned that her evening pressures are still rnning high around 8PM.  Says BP went low on day after she went walking BP was 88/60.  Says it was high today.    9 AM - Take the metoprolol 1 tab in morning and Losartan 100 mg   Noon- amlodipine 5 mg at lunch  5 PM - Take the other 1 of the metoprolol in the evening and 2.5mg  of amlodipine.      Review of Systems     Objective:   Physical Exam  Constitutional: She is oriented to person, place, and time. She appears well-developed and well-nourished.  HENT:  Head: Normocephalic and atraumatic.  Cardiovascular: Normal rate, regular rhythm and normal heart sounds.   Pulmonary/Chest: Effort normal and breath sounds normal.  Neurological: She is alert and oriented to person, place, and time.  Skin: Skin is warm and dry.  Psychiatric: She has a normal mood and affect. Her behavior is normal.       Assessment & Plan:  HTN -overall I think her blood pressures look fairly great. She brought in her numbers from August and September. She really only had 3 blood pressures with systolic was greater than 150. Because she is concerned about the evening pressures will try increasing her new dose of amlodipine to 7.5 mg. She had previously tried increasing to 10 mg but it was causing hypotension. We will try something in between. Please continue to follow and let me know if this is not helping her working well. Encouraged her to stick with it for several weeks before make another adjustment. Otherwise I will see her back in 3-4 months.  Due for screening mammogram.

## 2015-05-07 ENCOUNTER — Other Ambulatory Visit: Payer: Self-pay | Admitting: Family Medicine

## 2015-06-10 ENCOUNTER — Telehealth: Payer: Self-pay

## 2015-06-10 NOTE — Telephone Encounter (Signed)
Tina Graham felt dizzy and almost passed out. She check her blood pressure and one arm it was 114/70 in the right arm and 98/52 in the left arm. I advised her not to take her evening pill and to come in for a follow up.

## 2015-06-11 ENCOUNTER — Encounter: Payer: Self-pay | Admitting: Family Medicine

## 2015-06-11 ENCOUNTER — Ambulatory Visit (INDEPENDENT_AMBULATORY_CARE_PROVIDER_SITE_OTHER): Payer: Commercial Managed Care - HMO | Admitting: Family Medicine

## 2015-06-11 VITALS — BP 152/55 | HR 67 | Temp 97.5°F | Wt 125.7 lb

## 2015-06-11 DIAGNOSIS — R42 Dizziness and giddiness: Secondary | ICD-10-CM

## 2015-06-11 DIAGNOSIS — I959 Hypotension, unspecified: Secondary | ICD-10-CM | POA: Diagnosis not present

## 2015-06-11 DIAGNOSIS — R001 Bradycardia, unspecified: Secondary | ICD-10-CM

## 2015-06-11 NOTE — Patient Instructions (Signed)
9 AM - Take the metoprolol 1 tab in morning and Losartan 100 mg   Noon- amlodipine 5 mg  5 PM - Take the other 1 of the metoprolol in the evening

## 2015-06-11 NOTE — Progress Notes (Signed)
   Subjective:    Patient ID: Tina Graham, female    DOB: 08/17/1928, 81 y.o.   MRN: [id number]  HPI Patient is here today because she had an episode where she felt dizzy and almost passed out. She checked her blood pressure and it was 114/70 in the right arm and 98/52 in the left arm. We advised her to skip her evening dose of her medication and then follow-up this morning. When I last saw her I was action pre-sinusitis with most of her blood pressures but she was still having some slightly elevated pressures in the evenings we decided to increase her amlodipine to 7.5 mg. When we had tried a previous dose of 10 mg it actually caused hypotension so we decided to try 7.5.  She was taking metoprolol and losartan in the morning and her 7.5 mg of amlodipine at noon and 1 tab of the metoprolol in the evening. Also on her blood pressure log that she brought in today she's had several low pulse rates in the 30s and 40s. Typically she runs in the 60s and 70s.   Review of Systems No CP, SOB or palpitations.  Just felt dizzy esp when standing up.       Objective:   Physical Exam  Constitutional: She is oriented to person, place, and time. She appears well-developed and well-nourished.  HENT:  Head: Normocephalic and atraumatic.  Neck: Neck supple. No thyromegaly present.  Cardiovascular: Normal rate, regular rhythm and normal heart sounds.   Pulmonary/Chest: Effort normal and breath sounds normal.  Musculoskeletal: She exhibits no edema.  Lymphadenopathy:    She has no cervical adenopathy.  Neurological: She is alert and oriented to person, place, and time.  Skin: Skin is warm and dry.  Psychiatric: She has a normal mood and affect. Her behavior is normal.          Assessment & Plan:  Hypotension-orthostatics were normal. I suspect sxs are related to the increase of her amlodipine about a week ago. willl dec back down to 5mg  at noon and continue other medicaitons regularly.   If pulse  stays low then let me know.    Bradycardia-EKG today shows rate of 66 bpm, normal sinus rhythm left axis deviation. No acute ST-T wave elevation.no significant change from previous in 2013.If after decreasing the amlodipine she is still having pulse less tha 50 at home then call us back.  May need further workup.  Labwork not indicated at this point in time.

## 2015-07-01 ENCOUNTER — Other Ambulatory Visit: Payer: Self-pay | Admitting: Family Medicine

## 2015-07-18 ENCOUNTER — Other Ambulatory Visit: Payer: Self-pay | Admitting: Family Medicine

## 2015-08-06 ENCOUNTER — Encounter: Payer: Self-pay | Admitting: Family Medicine

## 2015-08-06 ENCOUNTER — Ambulatory Visit (INDEPENDENT_AMBULATORY_CARE_PROVIDER_SITE_OTHER): Payer: Commercial Managed Care - HMO | Admitting: Family Medicine

## 2015-08-06 VITALS — BP 151/66 | HR 74 | Wt 128.0 lb

## 2015-08-06 DIAGNOSIS — E785 Hyperlipidemia, unspecified: Secondary | ICD-10-CM

## 2015-08-06 DIAGNOSIS — R001 Bradycardia, unspecified: Secondary | ICD-10-CM

## 2015-08-06 DIAGNOSIS — I10 Essential (primary) hypertension: Secondary | ICD-10-CM

## 2015-08-06 DIAGNOSIS — M25511 Pain in right shoulder: Secondary | ICD-10-CM

## 2015-08-06 DIAGNOSIS — R1030 Lower abdominal pain, unspecified: Secondary | ICD-10-CM

## 2015-08-06 LAB — COMPLETE METABOLIC PANEL WITH GFR
ALK PHOS: 95 U/L (ref 33–130)
ALT: 16 U/L (ref 6–29)
AST: 19 U/L (ref 10–35)
Albumin: 4.4 g/dL (ref 3.6–5.1)
BILIRUBIN TOTAL: 1 mg/dL (ref 0.2–1.2)
BUN: 19 mg/dL (ref 7–25)
CALCIUM: 10 mg/dL (ref 8.6–10.4)
CO2: 27 mmol/L (ref 20–31)
CREATININE: 0.96 mg/dL — AB (ref 0.60–0.88)
Chloride: 103 mmol/L (ref 98–110)
GFR, EST AFRICAN AMERICAN: 63 mL/min (ref >=60)
GFR, EST NON AFRICAN AMERICAN: 54 mL/min — AB (ref >=60)
Glucose, Bld: 96 mg/dL (ref 65–99)
Potassium: 4.5 mmol/L (ref 3.5–5.3)
Sodium: 141 mmol/L (ref 135–146)
TOTAL PROTEIN: 7 g/dL (ref 6.1–8.1)

## 2015-08-06 LAB — LIPID PANEL
CHOLESTEROL: 224 mg/dL — AB (ref 125–200)
HDL: 61 mg/dL (ref >=46)
LDL CALC: 134 mg/dL — AB (ref <130)
TRIGLYCERIDES: 143 mg/dL (ref <150)
Total CHOL/HDL Ratio: 3.7 Ratio (ref <=5.0)
VLDL: 29 mg/dL (ref <30)

## 2015-08-06 NOTE — Patient Instructions (Signed)
9 AM - Take the metoprolol 1 tab in morning and Losartan 100 mg   Noon- amlodipine 7.5 mg  5 PM - Take the other 1 of the metoprolol in the evening

## 2015-08-06 NOTE — Progress Notes (Signed)
   Subjective:    Patient ID: Tina Graham, female    DOB: 11/09/1928, 82 y.o.   MRN: [id number]  HPI Hypertension- Pt denies chest pain, SOB, dizziness, or heart palpitations.  Taking meds as directed w/o problems.  Denies medication side effects.  I last saw her about 2 months ago and we decreased her amlodipine back down to 5 mg at knee and continue her other medications as she had had an orthostatic episode. Recently after we had increased the amlodipine. She was also noted on her home blood pressure chart to have some episodes of bradycardia. I was hoping that some of this would resolve after decreasing the amlodipine. She says her blood pressure started to go up again so she actually started the 7.5 of the amlodipine again and has been on it for the last 30 days and is doing well while with it. She has not had any more hypotensive episodes.   9 AM - Take the metoprolol 1 tab in morning and Losartan 100 mg   Noon- amlodipine 7.5 mg  5 PM - Take the other 1 of the metoprolol in the evening    C/O bilateral groin crease pain. Wporse on the right lately.  Says wearing tight underwear made it worse.  No injury or trauma. No known hip problems.  Right shoulder pain for several weeks. She's been using some type of green tea rub which does seem to provide relief. She denies any injury. Painful to sleep on that shoulder. It bothers her more if she ever uses that arm.   Review of Systems     Objective:   Physical Exam  Constitutional: She is oriented to person, place, and time. She appears well-developed and well-nourished.  HENT:  Head: Normocephalic and atraumatic.  Cardiovascular: Normal rate, regular rhythm and normal heart sounds.   Pulmonary/Chest: Effort normal and breath sounds normal.  Musculoskeletal:  Hips, knees, ankles with normal strength and range of motion. No significant discomfort with internal or external rotation of either hip. Nontender along the groin crease. Right  shoulder with slightly decreased extension and evident decreased external rotation. Able to touch low back but not able to raise hand higher than that. Nontender over the shoulder joint itself. No swelling.  Neurological: She is alert and oriented to person, place, and time.  Skin: Skin is warm and dry.  Psychiatric: She has a normal mood and affect. Her behavior is normal.          Assessment & Plan:  Hypertension -reviewed her home blood pressure log. Overall her blood pressures very well controlled. She has a few pressures are running in the 150s and a few pressures that are low but I would say 80% of them are at goal.  Right shoulder pain-I do think she is actually getting a early frozen shoulder. Recommend that she see one of our sports medicine doctors for further evaluation and treatment. She says for now she will hold off and just work on her stretches.  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 At this point she's gotten some relief with changing her type of underwear and declines further workup at this time.  Bradycardia - pulses things regulated again but we will just continue to monitor for any bradycardia.  Due for screening mammogram.  She will try to schedule.

## 2015-08-07 ENCOUNTER — Encounter: Payer: Self-pay | Admitting: Family Medicine

## 2015-08-07 DIAGNOSIS — N183 Chronic kidney disease, stage 3 unspecified: Secondary | ICD-10-CM | POA: Insufficient documentation

## 2015-09-16 ENCOUNTER — Other Ambulatory Visit: Payer: Self-pay | Admitting: Family Medicine

## 2015-12-05 ENCOUNTER — Other Ambulatory Visit: Payer: Self-pay | Admitting: Family Medicine

## 2016-01-27 ENCOUNTER — Emergency Department (INDEPENDENT_AMBULATORY_CARE_PROVIDER_SITE_OTHER)
Admission: EM | Admit: 2016-01-27 | Discharge: 2016-01-27 | Disposition: A | Discharge status: Home / Self Care | Payer: Commercial Managed Care - HMO | Source: Home / Self Care

## 2016-01-27 DIAGNOSIS — Z87891 Personal history of nicotine dependence: Secondary | ICD-10-CM | POA: Diagnosis not present

## 2016-01-27 DIAGNOSIS — R4182 Altered mental status, unspecified: Secondary | ICD-10-CM | POA: Diagnosis not present

## 2016-01-27 DIAGNOSIS — I10 Essential (primary) hypertension: Secondary | ICD-10-CM | POA: Diagnosis not present

## 2016-01-27 DIAGNOSIS — E785 Hyperlipidemia, unspecified: Secondary | ICD-10-CM | POA: Diagnosis not present

## 2016-01-27 DIAGNOSIS — M810 Age-related osteoporosis without current pathological fracture: Secondary | ICD-10-CM | POA: Diagnosis not present

## 2016-01-27 DIAGNOSIS — I160 Hypertensive urgency: Secondary | ICD-10-CM

## 2016-01-27 DIAGNOSIS — Z886 Allergy status to analgesic agent status: Secondary | ICD-10-CM | POA: Diagnosis not present

## 2016-01-27 DIAGNOSIS — R55 Syncope and collapse: Secondary | ICD-10-CM

## 2016-01-27 DIAGNOSIS — E039 Hypothyroidism, unspecified: Secondary | ICD-10-CM | POA: Diagnosis not present

## 2016-01-27 DIAGNOSIS — K219 Gastro-esophageal reflux disease without esophagitis: Secondary | ICD-10-CM | POA: Diagnosis not present

## 2016-01-27 DIAGNOSIS — N3000 Acute cystitis without hematuria: Secondary | ICD-10-CM | POA: Diagnosis not present

## 2016-01-27 DIAGNOSIS — R5383 Other fatigue: Secondary | ICD-10-CM | POA: Diagnosis not present

## 2016-01-27 NOTE — ED Provider Notes (Signed)
Filed Vitals:   01/27/16 1705  BP: 208/101  Pulse: 79  Temp: 98 F (36.7 C)  TempSrc: Oral  Resp: 16  Height: 5\' 1"  (1.549 m)  Weight: 125 lb (56.7 kg)  SpO2: 96%   Pt is an 82yo female [facility] by her son-in-law for an episode of "not feeling right" and almost "passing out" after mowing the lawn. Pt notes her pulse was in the 30s and had some blurred vision but states vision is better now.   BP was 208/101 in triage.  Pt normally has low blood pressure.  She is taking Norvasc and Metoprolol. Denies chest pain, SOB, or palpitations.    On exam, pt is alert to person, place and time. Able to follow 2 step commands. Normal finger to nose coordination. Speech is clear.   Reviewed medical charts, hx of possible TIAs in the past. Hx of hypotension. Due to reports of possible syncope, elevated BP, and reports of HR in 30s, (now up to 79 bpm and regular) strongly encouraged further evaluation and treatment in emergency department for hypertensive urgency. Pt refused EMS transport due to financial reasons.. Son in law [facility].  [facility] notified of [facility]'s pending arrival POV.  Pt discharged in stable condition.   Junius Finnerrin O'Malley, PA-C 01/27/16 1721

## 2016-01-27 NOTE — ED Notes (Signed)
Pt reports that she "passed out" 1 hour ago and then couldn't get her pulse above the 30's. She reports episodes of low BP and high BP, but doesn't recall the readings.

## 2016-01-28 ENCOUNTER — Telehealth: Payer: Self-pay | Admitting: *Deleted

## 2016-01-28 NOTE — ED Notes (Unsigned)
Spoke to pt she reports that she is feeling better today. Clemens Catholichristy Adeana Grilliot, LPN

## 2016-02-11 ENCOUNTER — Ambulatory Visit (INDEPENDENT_AMBULATORY_CARE_PROVIDER_SITE_OTHER): Payer: Commercial Managed Care - HMO | Admitting: Family Medicine

## 2016-02-11 ENCOUNTER — Encounter: Payer: Self-pay | Admitting: Family Medicine

## 2016-02-11 ENCOUNTER — Other Ambulatory Visit: Payer: Self-pay | Admitting: Family Medicine

## 2016-02-11 VITALS — BP 132/60 | HR 62 | Wt 126.0 lb

## 2016-02-11 DIAGNOSIS — I10 Essential (primary) hypertension: Secondary | ICD-10-CM | POA: Diagnosis not present

## 2016-02-11 DIAGNOSIS — Z13220 Encounter for screening for lipoid disorders: Secondary | ICD-10-CM | POA: Diagnosis not present

## 2016-02-11 DIAGNOSIS — R55 Syncope and collapse: Secondary | ICD-10-CM

## 2016-02-11 DIAGNOSIS — R319 Hematuria, unspecified: Secondary | ICD-10-CM | POA: Diagnosis not present

## 2016-02-11 DIAGNOSIS — N760 Acute vaginitis: Secondary | ICD-10-CM | POA: Diagnosis not present

## 2016-02-11 DIAGNOSIS — L821 Other seborrheic keratosis: Secondary | ICD-10-CM

## 2016-02-11 DIAGNOSIS — L720 Epidermal cyst: Secondary | ICD-10-CM | POA: Diagnosis not present

## 2016-02-11 DIAGNOSIS — E785 Hyperlipidemia, unspecified: Secondary | ICD-10-CM

## 2016-02-11 LAB — POCT URINALYSIS DIPSTICK
Bilirubin, UA: NEGATIVE
Glucose, UA: NEGATIVE
Ketones, UA: NEGATIVE
Leukocytes, UA: NEGATIVE
NITRITE UA: NEGATIVE
PH UA: 5.5
Protein, UA: NEGATIVE
SPEC GRAV UA: 1.02
UROBILINOGEN UA: 0.2

## 2016-02-11 LAB — WET PREP, GENITAL
Clue Cells Wet Prep HPF POC: NONE SEEN
TRICH WET PREP: NONE SEEN
YEAST WET PREP: NONE SEEN

## 2016-02-11 LAB — LIPID PANEL
CHOL/HDL RATIO: 3 ratio (ref <=5.0)
Cholesterol: 211 mg/dL — ABNORMAL HIGH (ref 125–200)
HDL: 70 mg/dL (ref >=46)
LDL Cholesterol: 119 mg/dL (ref <130)
Triglycerides: 108 mg/dL (ref <150)
VLDL: 22 mg/dL (ref <30)

## 2016-02-11 LAB — BASIC METABOLIC PANEL
BUN: 18 mg/dL (ref 7–25)
CO2: 25 mmol/L (ref 20–31)
Calcium: 10 mg/dL (ref 8.6–10.4)
Chloride: 103 mmol/L (ref 98–110)
Creat: 0.88 mg/dL (ref 0.60–0.88)
Glucose, Bld: 98 mg/dL (ref 65–99)
POTASSIUM: 4.9 mmol/L (ref 3.5–5.3)
SODIUM: 137 mmol/L (ref 135–146)

## 2016-02-11 MED ORDER — ALPRAZOLAM 1 MG PO TABS: 1.0000 mg | ORAL_TABLET | Freq: Every evening | ORAL | Status: DC | PRN

## 2016-02-11 NOTE — Progress Notes (Addendum)
Subjective:    CC: HTN  HPI: Hypertension- Pt denies chest pain, SOB, dizziness, or heart palpitations.  Taking meds as directed w/o problems.  Denies medication side effects.  Patient brought in home blood pressure log today. Most of her blood pressures look really well controlled. She had a couple of evenings where her blood pressures ran in the 150s. Evening evening of June 30 in the evening of July 1, and July 2nd.    Patient was seen at [facility] for a vasovagal syncope episode as well as a bladder infection. She was given a prescription of Keflex 5 mg to take for 10 days. She was seen at the hospital on June 26. He says she still having a little bit external discomfort.  Hyperlipidemia -s he has been eating coconut oil and wants to see if she it is lowering her cholesterol.   She gets weak in her legs.  Feels weak after she goes to her mailbox.    Rash on her neck x 1 months.  She says it seems to have improved she cut the tags out of her T-shirt. She says initially it was irritating and a little itchy. She did not use any creams or ointments on it. She has been burising easily as well.   She also has a milia on the underside of her jaw on the right.   Past medical history, Surgical history, Family history not pertinant except as noted below, Social history, Allergies, and medications have been entered into the medical record, reviewed, and corrections made.   Review of Systems: No fevers, chills, night sweats, weight loss, chest pain, or shortness of breath.   Objective:    General: Well Developed, well nourished, and in no acute distress.  Neuro: Alert and oriented x3, extra-ocular muscles intact, sensation grossly intact.  HEENT: Normocephalic, atraumatic  Skin: Warm and dry, no rashes.I did not appreciate any rash on her neck. She had a few seborrheic keratoses. Larger milia on her right side under her jaw.  Cardiac: Regular rate and rhythm, no murmurs rubs or gallops, no  lower extremity edema.  Respiratory: Clear to auscultation bilaterally. Not using accessory muscles, speaking in full sentences.   Impression and Recommendations:   HTN - continue current regimen. All her blood pressures look good. She had a few evenings with elevated but overall they look great. We'll continue with contrast current regimen. She was initially concerned that losartan was causing some easy bruising but I suspect it's really her fish oil.  Syncope-initially had recommended cardiology evaluation since she's had multiple episodes in the last several months. She declines we will go ahead and move forward with scheduling an echocardiogram to start.  Hyperlipidemia-due to recheck lipids. Slip provided today.  Lower extremity weakness-unclear etiology at this point. We'll continue to monitor. No weakness functionally today. She is walking without difficulty and able to get up on the exam table without difficulty.  Seborrheic keratosis-I do not see any evidence of any specific rash. She does only has some seborrheic keratoses of the top of her shoulder near her neck. Certainly the clothing or tag in the clothing could've irritated even cause some temporary itching and irritation but it looks perfectly fine today. If symptoms recur then consider shave biopsy for further evaluation.  Urinary tract infection-because she still having a little bit of dysuria we'll recheck urinalysis and wet prep. Will call with results once available.  Incision and Drainage Procedure Note  Pre-operative Diagnosis: milia  Post-operative Diagnosis:  same  Indications: irritaion  Anesthesia: not needed  Procedure Details  The procedure, risks and complications have been discussed in detail (including, but not limited to airway compromise, infection, bleeding) with the patient. Verbal consent given.   The skin was sterilely prepped and draped over the affected area in the usual fashion. Area cleaned with  alcohol, I&D with a #11 blade was performed on the right under side of jaw. Thick white drainge.  The patient was observed until stable.  Findings: milia  EBL: 0 cc's  Drains: none  Condition: Tolerated procedure well   Complications: none.

## 2016-02-12 LAB — URINALYSIS, MICROSCOPIC ONLY
Bacteria, UA: NONE SEEN [HPF]
Casts: NONE SEEN [LPF]
Crystals: NONE SEEN [HPF]
Squamous Epithelial / LPF: NONE SEEN [HPF] (ref <=5)
Yeast: NONE SEEN [HPF]

## 2016-02-12 NOTE — Progress Notes (Signed)
Quick Note:  All labs are normal. ______ 

## 2016-02-13 ENCOUNTER — Encounter: Payer: Self-pay | Admitting: Family Medicine

## 2016-02-13 LAB — URINE CULTURE
COLONY COUNT: NO GROWTH
ORGANISM ID, BACTERIA: NO GROWTH

## 2016-02-19 DIAGNOSIS — I519 Heart disease, unspecified: Secondary | ICD-10-CM | POA: Diagnosis not present

## 2016-02-19 DIAGNOSIS — R55 Syncope and collapse: Secondary | ICD-10-CM | POA: Diagnosis not present

## 2016-02-19 DIAGNOSIS — I071 Rheumatic tricuspid insufficiency: Secondary | ICD-10-CM | POA: Diagnosis not present

## 2016-02-19 DIAGNOSIS — I517 Cardiomegaly: Secondary | ICD-10-CM | POA: Diagnosis not present

## 2016-02-26 ENCOUNTER — Telehealth: Payer: Self-pay | Admitting: Family Medicine

## 2016-02-26 DIAGNOSIS — R55 Syncope and collapse: Secondary | ICD-10-CM | POA: Insufficient documentation

## 2016-02-26 NOTE — Telephone Encounter (Signed)
Pt informed of results. She stated that she did have another episode of syncope. I advised that she stay hydrated and to take her time when she goes from a sitting to standing position. Pt voiced understanding and agreed. She asked for a copy of her echo results. I could not locate these. Advised her that when I could that I would mail them to her.Tina Graham

## 2016-02-26 NOTE — Telephone Encounter (Signed)
Call patient: I did receive the results back from her echocardiogram. Overall the heart looks great. The pumping function is normal at 65-70%. Just slight abnormal relaxation component but otherwise everything really looks great. The valves are normal as well. There is nothing there that should have caused your syncope

## 2016-03-06 ENCOUNTER — Encounter: Payer: Self-pay | Admitting: Family Medicine

## 2016-03-26 ENCOUNTER — Other Ambulatory Visit: Payer: Self-pay | Admitting: Family Medicine

## 2016-03-26 ENCOUNTER — Telehealth: Payer: Self-pay | Admitting: *Deleted

## 2016-03-26 NOTE — Telephone Encounter (Signed)
Received refill request for thyroid medication. Called patient to let her know that she was overdue to have her TSH checked. Patient does not want to come in to have labs drawn until her scheduled appointment in November. I did explained to her that she didn't have to see the provider but she could just come by the lab to have TSH checked. I also advised that I didn't want to send in a 90 day supply of medication if for some reason her meds needed to be adjusted according to results of thyroid labs. Patient checked and she states she has a whole bottle (90 tablets) of her thyroid medication and does not need a refill and will come in at appt time in November

## 2016-05-19 ENCOUNTER — Ambulatory Visit (INDEPENDENT_AMBULATORY_CARE_PROVIDER_SITE_OTHER): Payer: Commercial Managed Care - HMO | Admitting: Family Medicine

## 2016-05-19 ENCOUNTER — Encounter: Payer: Self-pay | Admitting: Family Medicine

## 2016-05-19 VITALS — BP 173/77 | HR 63 | Wt 126.0 lb

## 2016-05-19 DIAGNOSIS — S80212A Abrasion, left knee, initial encounter: Secondary | ICD-10-CM | POA: Diagnosis not present

## 2016-05-19 DIAGNOSIS — R55 Syncope and collapse: Secondary | ICD-10-CM

## 2016-05-19 DIAGNOSIS — E039 Hypothyroidism, unspecified: Secondary | ICD-10-CM | POA: Diagnosis not present

## 2016-05-19 DIAGNOSIS — E785 Hyperlipidemia, unspecified: Secondary | ICD-10-CM | POA: Diagnosis not present

## 2016-05-19 DIAGNOSIS — S01511A Laceration without foreign body of lip, initial encounter: Secondary | ICD-10-CM | POA: Diagnosis not present

## 2016-05-19 DIAGNOSIS — S0081XA Abrasion of other part of head, initial encounter: Secondary | ICD-10-CM | POA: Diagnosis not present

## 2016-05-19 NOTE — Progress Notes (Signed)
Subjective:    CC: Syncope  HPI:  Patient comes in today complaining of several syncopal events. She has discussed it previously with me in 1 I saw her couple months ago had recommended cardiology referral but she declined at that time. She did at least allow me to order an echocardiogram which for the most part was essentially normal. It did show some 1+ my diastolic dysfunction with a normal ejection fraction. Since I last saw her she's had several episodes where she has blacked out. The first being on August 5. She said is that occurred typically around 5:30 or 6 PM that Saturday night. Again on August 24 it happened in the morning. Then on September 2 she actually fell in her kitchen. She says she wasn't sure if she passed out or something fell on her but she had some pain on her left side over her ribs afterwards. Then on September 15 she said she remembers not feeling well and she was about to get in the shower. The next thing she knows she will members waking up in her living room fully dressed in her close. She then had 2 more episodes in October one being on October 6 and the second on October 13. She says sometimes she feels "different" for she passes out but it's not consistent. She had a negative head CT in June when she went to the emergency department for similar symptoms. It just showed periventricular white matter most consistent with small vessel ischemic changes. She had normal carotid Dopplers in May 2015 done at [facility].  She fell today coming into our building.     Past medical history, Surgical history, Family history not pertinant except as noted below, Social history, Allergies, and medications have been entered into the medical record, reviewed, and corrections made.   Review of Systems: No fevers, chills, night sweats, weight loss, chest pain, or shortness of breath.   Objective:    General: Well Developed, well nourished, and in no acute distress.  Neuro: Alert and oriented  x3, extra-ocular muscles intact, sensation grossly intact.  HEENT: Normocephalic, atraumatic  Skin: Warm and dry, no rashes. She has abrasion ot her left knee as below.  She has an external and internal laceration to her right lower lip and and abrasion on her chin.  Cardiac: Regular rate and rhythm, no murmurs rubs or gallops, no lower extremity edema.  Respiratory: Clear to auscultation bilaterally. Not using accessory muscles, speaking in full sentences.           Impression and Recommendations:   Syncope-her episodes are unusual in that sometimes she just has spaces a time where she doesn't remember doing things like getting dressed. This is not typical for just true syncope. EKG shows rate of 64 bpm, left axis deviation, LVH with left ant fasc block.  Unchanged from previous. Recommend cardiology referral for further evaluation. She is already had a CT of the head to rule out any type of tumor or acute stroke. Consider seizure disorder. None of her episodes have been witnessed so this is difficult.    Chemistry      Component Value Date/Time   NA 137 02/11/2016 1029   K 4.9 02/11/2016 1029   CL 103 02/11/2016 1029   CO2 25 02/11/2016 1029   BUN 18 02/11/2016 1029   CREATININE 0.88 02/11/2016 1029      Component Value Date/Time   CALCIUM 10.0 02/11/2016 1029   ALKPHOS 95 08/06/2015 1040   AST 19 08/06/2015  1040   ALT 16 08/06/2015 1040   BILITOT 1.0 08/06/2015 1040     Lab Results  Component Value Date   TSH 2.881 02/26/2015    Abrasion to left knee, chin and lip laceration from fall today here in our building. Ice pack and pressure applied.will report in [facility].

## 2016-05-19 NOTE — Patient Instructions (Addendum)
We are referring you to cardiology.

## 2016-05-20 LAB — LIPID PANEL
CHOL/HDL RATIO: 3.3 ratio (ref <=5.0)
Cholesterol: 209 mg/dL — ABNORMAL HIGH (ref 125–200)
HDL: 64 mg/dL (ref >=46)
LDL CALC: 127 mg/dL (ref <130)
Triglycerides: 92 mg/dL (ref <150)
VLDL: 18 mg/dL (ref <30)

## 2016-05-20 LAB — TSH: TSH: 4.9 m[IU]/L — AB

## 2016-05-21 ENCOUNTER — Other Ambulatory Visit: Payer: Self-pay | Admitting: Family Medicine

## 2016-05-21 DIAGNOSIS — E039 Hypothyroidism, unspecified: Secondary | ICD-10-CM

## 2016-05-21 MED ORDER — LEVOTHYROXINE SODIUM 137 MCG PO TABS: 137.0000 ug | ORAL_TABLET | Freq: Every day | ORAL | 0 refills | Status: DC

## 2016-05-21 NOTE — Addendum Note (Signed)
Addended by: Deno EtienneBARKLEY, Osha Rane L on: 05/21/2016 05:35 PM   Modules accepted: Orders

## 2016-06-08 NOTE — Progress Notes (Signed)
HPI: 82 year old female for evaluation of syncope. Head CT June 2017 showed small vessel disease but no other abnormalities. Echocardiogram in [place] in July 2017 showed normal LV systolic function, grade 1 diastolic dysfunction, mild left atrial enlargement and mild tricuspid regurgitation. Patient is an extremely difficult historian. She has had multiple "passing out spells". She describes 2 of these episodes as laying on her bed and then waking up under the covers. Another episode she states she was in the bathroom and then was dressed in the living room. She wonders if she is dreaming. She also has had spells where she winds up in the floor in her kitchen and has had some trauma. She denies chest pain, palpitations, dyspnea or nausea. Because of the above we were asked to evaluate.   Current Outpatient Prescriptions  Medication Sig Dispense Refill  . ALPRAZolam (XANAX) 1 MG tablet Take 1 tablet (1 mg total) by mouth at bedtime as needed for anxiety. 90 tablet 0  . amLODipine (NORVASC) 2.5 MG tablet TAKE 1 TABLET (2.5 MG TOTAL) BY MOUTH DAILY. 90 tablet 1  . aspirin EC 81 MG tablet Take 81 mg by mouth daily.    . Lecithin 1200 MG CAPS Take by mouth daily.    Marland Kitchen. levothyroxine (SYNTHROID, LEVOTHROID) 137 MCG tablet Take 1 tablet (137 mcg total) by mouth daily. 90 tablet 0  . losartan (COZAAR) 100 MG tablet TAKE 1 TABLET EVERY DAY 90 tablet 3  . metoprolol (LOPRESSOR) 100 MG tablet TAKE 1 TABLET TWICE DAILY 180 tablet 3  . Misc Natural Products (OSTEO BI-FLEX TRIPLE STRENGTH PO) Take by mouth.    . Multiple Vitamins-Minerals (CENTRUM SILVER PO) Take by mouth.    . Omega-3 Fatty Acids (FISH OIL) 1000 MG CAPS Take 1,000 mg by mouth daily.     Marland Kitchen. OVER THE COUNTER MEDICATION Energy greens     No current facility-administered medications for this visit.     Allergies  Allergen Reactions  . Ibuprofen Other (See Comments)    Patient is not sure what happens if she takes this medicine.  .  Simvastatin     REACTION: myalgias     Past Medical History:  Diagnosis Date  . H. pylori infection    treated  . HOH (hard of hearing)   . Hyperlipidemia   . Hypertension   . Hypothyroidism     Past Surgical History:  Procedure Laterality Date  . APPENDECTOMY    . CHOLECYSTECTOMY    . THYROIDECTOMY     partial for goiter    Social History   Social History  . Marital status: Widowed    Spouse name: N/A  . Number of children: 5  . Years of education: N/A   Occupational History  .  Retired   Social History Main Topics  . Smoking status: Former Games developermoker  . Smokeless tobacco: Never Used  . Alcohol use No  . Drug use: No  . Sexual activity: Not on file     Comment: housewife, widowed, lives alone, regular exercise   Other Topics Concern  . Not on file   Social History Narrative  . No narrative on file    Family History  Problem Relation Age of Onset  . Cancer Daughter   . Heart disease Sister     unknown specifics    ROS: no fevers or chills, productive cough, hemoptysis, dysphasia, odynophagia, melena, hematochezia, dysuria, hematuria, rash, seizure activity, orthopnea, PND, pedal edema, claudication. Remaining systems are negative.  Physical Exam:   Blood pressure (!) 181/95, pulse 84, height 5\' 1"  (1.549 m), weight 125 lb 6.4 oz (56.9 kg).  General:  Well developed/well nourished in NAD Skin warm/dry Patient not depressed No peripheral clubbing Back-normal HEENT-normal/normal eyelids Neck supple/normal carotid upstroke bilaterally; no bruits; no JVD; no thyromegaly chest - CTA/ normal expansion CV - RRR/normal S1 and S2; no murmurs, rubs or gallops;  PMI nondisplaced Abdomen -NT/ND, no HSM, no mass, + bowel sounds, no bruit 2+ femoral pulses, no bruits Ext-no edema, chords, 2+ DP Neuro-grossly nonfocal  ECG 05/19/2016-sinus rhythm, left anterior fascicular block, left ventricular hypertrophy.  A/P  1 question syncope-some of her episodes are  clearly not syncope but seem more consistent with altered mental status. However she has had some episodes where she falls in the floor resulting in trauma. Her LV function is normal. I will arrange an event monitor to more fully assess. I wonder if she may be having early dementia. I have asked her not to drive.   2 hypertension-blood pressure is elevated. However she states is up and down. This will need to be followed and further medication adjustments made by primary care.   Olga MillersBrian Yannis Broce, MD

## 2016-06-10 ENCOUNTER — Ambulatory Visit (INDEPENDENT_AMBULATORY_CARE_PROVIDER_SITE_OTHER): Payer: Commercial Managed Care - HMO | Admitting: Cardiology

## 2016-06-10 ENCOUNTER — Encounter: Payer: Self-pay | Admitting: Cardiology

## 2016-06-10 VITALS — BP 181/95 | HR 84 | Ht 61.0 in | Wt 125.4 lb

## 2016-06-10 DIAGNOSIS — I10 Essential (primary) hypertension: Secondary | ICD-10-CM

## 2016-06-10 DIAGNOSIS — R55 Syncope and collapse: Secondary | ICD-10-CM | POA: Diagnosis not present

## 2016-06-10 NOTE — Patient Instructions (Signed)
Medication Instructions:   NO CHANGE  Testing/Procedures:  Your physician has recommended that you wear an event monitor. Event monitors are medical devices that record the heart's electrical activity. Doctors most often us these monitors to diagnose arrhythmias. Arrhythmias are problems with the speed or rhythm of the heartbeat. The monitor is a small, portable device. You can wear one while you do your normal daily activities. This is usually used to diagnose what is causing palpitations/syncope (passing out).    Follow-Up:  Your physician recommends that you schedule a follow-up appointment in: 10-12 WEEKS WITH DR Jens SomRENSHAW

## 2016-06-15 ENCOUNTER — Encounter: Payer: Self-pay | Admitting: Family Medicine

## 2016-06-15 ENCOUNTER — Ambulatory Visit (INDEPENDENT_AMBULATORY_CARE_PROVIDER_SITE_OTHER): Payer: Commercial Managed Care - HMO | Admitting: Family Medicine

## 2016-06-15 DIAGNOSIS — Z23 Encounter for immunization: Secondary | ICD-10-CM | POA: Diagnosis not present

## 2016-06-15 DIAGNOSIS — R413 Other amnesia: Secondary | ICD-10-CM

## 2016-06-15 NOTE — Progress Notes (Signed)
Subjective:    CC: HTN   HPI:  Hypertension- Pt denies chest pain, SOB, dizziness, or heart palpitations.  Taking meds as directed w/o problems.  Denies medication side effects.    Still having episodes of near syncope. She is waiting for her holter monitor to arrive.  Feels some tightness on the left side under her axilla. Has some pain on the left side of her head.  Neg Head CT.  Echo fairly normal.    She did mention today that her son had asked her to ask about memory issues. He's noticed that she's having difficulty with word finding. She is also doing things and forgetting that she's done them.  Past medical history, Surgical history, Family history not pertinant except as noted below, Social history, Allergies, and medications have been entered into the medical record, reviewed, and corrections made.   Review of Systems: No fevers, chills, night sweats, weight loss, chest pain, or shortness of breath.   Objective:    General: Well Developed, well nourished, and in no acute distress.  Neuro: Alert and oriented x3, extra-ocular muscles intact, sensation grossly intact.  HEENT: Normocephalic, atraumatic  Skin: Warm and dry, no rashes. Cardiac: Regular rate and rhythm, no murmurs rubs or gallops, no lower extremity edema.  Respiratory: Clear to auscultation bilaterally. Not using accessory muscles, speaking in full sentences.   Impression and Recommendations:   HTN - Overall fairly well controlled. She did bring in her home readings. Most for morning blood pressures absolutely fantastic typically running in the 120s. Her evening pressures are a little higher. Sometimes running up into the 150s. But she also has a few low blood pressures on her list.  Near syncope - Awaiting holter monitor.  She did drive today.  She was unable to get a taxi.    Memory concerns - we did do a Mini-Mental status exam today. She missed the attention and calculation and recall components but did  fantastic on the rest of the exam. She was able to copy the overlapping pentagons. I did also have her draw a clock face and set the arms to 3:30. She wrote 10 on the clock twice instead of 11 but otherwise all the numbers were there and in correct position and the arms were set appropriately. Passing Mini-Mental status score for her would be 23 based on her age and level of 8th grade education. She scored a 22 out of 30 today. Recommend repeat in 6 months.

## 2016-06-18 ENCOUNTER — Encounter: Payer: Self-pay | Admitting: *Deleted

## 2016-06-18 NOTE — Progress Notes (Signed)
Patient ID: Tina Graham, female   DOB: 05/27/1929, 82 y.o.   MRN: [id number] Patient enrolled for [employer] to mail a cardiac event monitor to her home.

## 2016-06-22 ENCOUNTER — Telehealth: Payer: Self-pay | Admitting: Cardiology

## 2016-06-22 ENCOUNTER — Telehealth: Payer: Self-pay | Admitting: *Deleted

## 2016-06-22 NOTE — Telephone Encounter (Signed)
Patient enrolled 06/21/16 for [employer] to mail the cardiac event monitor to her home.

## 2016-06-22 NOTE — Telephone Encounter (Signed)
Reviewed what a cardiac event monitor was.  Patient would like mailed to her home.  I will enroll patient with [employer] today to have cardiac event monitor mailed to her home.

## 2016-06-22 NOTE — Telephone Encounter (Signed)
Pt was to get a heart monitor, was told they would mail it to her, then she got a call that she would have to come to [place] , she lives in [place] and states she can't drive to [place]  -pls call to advise [phone]

## 2016-06-25 DIAGNOSIS — R55 Syncope and collapse: Secondary | ICD-10-CM | POA: Diagnosis not present

## 2016-06-28 DIAGNOSIS — E039 Hypothyroidism, unspecified: Secondary | ICD-10-CM | POA: Diagnosis not present

## 2016-06-28 DIAGNOSIS — K219 Gastro-esophageal reflux disease without esophagitis: Secondary | ICD-10-CM | POA: Diagnosis not present

## 2016-06-28 DIAGNOSIS — S4991XA Unspecified injury of right shoulder and upper arm, initial encounter: Secondary | ICD-10-CM | POA: Diagnosis not present

## 2016-06-28 DIAGNOSIS — Z886 Allergy status to analgesic agent status: Secondary | ICD-10-CM | POA: Diagnosis not present

## 2016-06-28 DIAGNOSIS — Z87891 Personal history of nicotine dependence: Secondary | ICD-10-CM | POA: Diagnosis not present

## 2016-06-28 DIAGNOSIS — S0003XA Contusion of scalp, initial encounter: Secondary | ICD-10-CM | POA: Diagnosis not present

## 2016-06-28 DIAGNOSIS — M810 Age-related osteoporosis without current pathological fracture: Secondary | ICD-10-CM | POA: Diagnosis not present

## 2016-06-28 DIAGNOSIS — R55 Syncope and collapse: Secondary | ICD-10-CM | POA: Diagnosis not present

## 2016-06-28 DIAGNOSIS — E785 Hyperlipidemia, unspecified: Secondary | ICD-10-CM | POA: Diagnosis not present

## 2016-06-28 DIAGNOSIS — M85812 Other specified disorders of bone density and structure, left shoulder: Secondary | ICD-10-CM | POA: Diagnosis not present

## 2016-06-28 DIAGNOSIS — I10 Essential (primary) hypertension: Secondary | ICD-10-CM | POA: Diagnosis not present

## 2016-06-28 DIAGNOSIS — M19012 Primary osteoarthritis, left shoulder: Secondary | ICD-10-CM | POA: Diagnosis not present

## 2016-06-28 DIAGNOSIS — S0101XA Laceration without foreign body of scalp, initial encounter: Secondary | ICD-10-CM | POA: Diagnosis not present

## 2016-06-29 ENCOUNTER — Ambulatory Visit (INDEPENDENT_AMBULATORY_CARE_PROVIDER_SITE_OTHER): Payer: Commercial Managed Care - HMO

## 2016-06-29 DIAGNOSIS — R55 Syncope and collapse: Secondary | ICD-10-CM

## 2016-07-03 ENCOUNTER — Encounter: Payer: Self-pay | Admitting: Family Medicine

## 2016-07-03 ENCOUNTER — Ambulatory Visit (INDEPENDENT_AMBULATORY_CARE_PROVIDER_SITE_OTHER): Payer: Commercial Managed Care - HMO | Admitting: Family Medicine

## 2016-07-03 VITALS — BP 163/78 | HR 76 | Ht 61.0 in | Wt 125.0 lb

## 2016-07-03 DIAGNOSIS — R55 Syncope and collapse: Secondary | ICD-10-CM | POA: Diagnosis not present

## 2016-07-03 DIAGNOSIS — I10 Essential (primary) hypertension: Secondary | ICD-10-CM | POA: Diagnosis not present

## 2016-07-03 DIAGNOSIS — S0101XA Laceration without foreign body of scalp, initial encounter: Secondary | ICD-10-CM

## 2016-07-03 DIAGNOSIS — E039 Hypothyroidism, unspecified: Secondary | ICD-10-CM | POA: Diagnosis not present

## 2016-07-03 LAB — BASIC METABOLIC PANEL WITH GFR
BUN: 16 mg/dL (ref 7–25)
CHLORIDE: 103 mmol/L (ref 98–110)
CO2: 25 mmol/L (ref 20–31)
Calcium: 9.6 mg/dL (ref 8.6–10.4)
Creat: 0.87 mg/dL (ref 0.60–0.88)
GFR, EST AFRICAN AMERICAN: 70 mL/min (ref >=60)
GFR, EST NON AFRICAN AMERICAN: 61 mL/min (ref >=60)
Glucose, Bld: 96 mg/dL (ref 65–99)
POTASSIUM: 4.2 mmol/L (ref 3.5–5.3)
Sodium: 139 mmol/L (ref 135–146)

## 2016-07-03 LAB — TSH: TSH: 0.03 m[IU]/L — AB

## 2016-07-03 LAB — T4, FREE: FREE T4: 2.2 ng/dL — AB (ref 0.8–1.8)

## 2016-07-03 NOTE — Progress Notes (Signed)
Subjective:    CC: ED Follow up   HPI: Patient went to the emergency department at [facility] on November 26. She said she was getting ready for a party. Thinks she may have passed out in the next and she remembers was seeing her son. She evidently went to the door to let him in but doesn't remember doing it. She had hit the left side of her head and had a laceration. They did repair it in the emergency room. She said she had blood all over her kitchen. She has been having these episodes of syncope versus memory loss. She says she never really remembers falling and often times wakes up without injury though a few times she has been injured and she was this last time. She did see cardiology about 3 weeks ago and they decided to place her on a cardiac monitor.She has not had any more syncopal episodes since being home. She is still not driving and has been using a taxi to get here.  She is not had any residual headaches.  Hypothyroid - she is taking 137mcg daily. She has been on this dose for 6 weeks and wants to recheck it today instead of coming back in 2 weeks.   HTN- she didn't bring in her blood pressure log. Most of her blood pressures are in the 120s to 140s. She did have a couple blood pressures that were between 100 115. In a couple of blood pressures that were in the 150s but overall they look pretty well regulated. They do tend to run a little higher in the evenings. She says if it does run high she'll take an extra 2.5 mg of amlodipine.  Past medical history, Surgical history, Family history not pertinant except as noted below, Social history, Allergies, and medications have been entered into the medical record, reviewed, and corrections made.   Review of Systems: No fevers, chills, night sweats, weight loss, chest pain, or shortness of breath.   Objective:    General: Well Developed, well nourished, and in no acute distress.  Neuro: Alert and oriented x3, extra-ocular muscles intact,  sensation grossly intact.  HEENT: Normocephalic, atraumatic  Skin: Warm and dry, no rashes. She still has a large hematoma on the left scalp just above her ear. The abrasion itself is healing well. No sign of erythema or drainage. Cardiac: Regular rate and rhythm, no murmurs rubs or gallops, no lower extremity edema.  Respiratory: Clear to auscultation bilaterally. Not using accessory muscles, speaking in full sentences.   Impression and Recommendations:   Syncope-she is wearing a cardiac monitor so hopefully if this happens again we can catch the episode. We also discussed setting her to neurology to work her up for possible seizure disorder. Interestingly her daughter does have a history of seizures. He declined referral today and says she wants to finish wearing a heart monitor before we refer her. Next  Left scalp laceration-healing well. She still has a hematoma underneath the lesion.  Hypothyroidism-due to recheck thyroid level.  HTN - pressure elevated here in the office today but home blood pressures overall well controlled.

## 2016-07-20 ENCOUNTER — Other Ambulatory Visit: Payer: Self-pay | Admitting: Family Medicine

## 2016-07-20 ENCOUNTER — Telehealth: Payer: Self-pay | Admitting: Cardiology

## 2016-07-20 NOTE — Telephone Encounter (Signed)
[employer] monitoring company called stating that at beginning of recording it started out in a pause that lasted 3.7 seconds but they could not tell how long the actual pause was.  Then there was another 9 second pause.  Patient stated that she passed out during this time.  After the pause the was bradycardic at a rate of 30 right after the pause. I contacted patient and instructed her to call EMS or have someone drive her to ER.  She is going to call her daughter to have her take her [facility] for further evaluation as that is the closest facility to her.

## 2016-07-21 DIAGNOSIS — E785 Hyperlipidemia, unspecified: Secondary | ICD-10-CM | POA: Diagnosis not present

## 2016-07-21 DIAGNOSIS — Z888 Allergy status to other drugs, medicaments and biological substances status: Secondary | ICD-10-CM | POA: Diagnosis not present

## 2016-07-21 DIAGNOSIS — Z9181 History of falling: Secondary | ICD-10-CM | POA: Diagnosis not present

## 2016-07-21 DIAGNOSIS — I499 Cardiac arrhythmia, unspecified: Secondary | ICD-10-CM | POA: Diagnosis not present

## 2016-07-21 DIAGNOSIS — I10 Essential (primary) hypertension: Secondary | ICD-10-CM | POA: Diagnosis not present

## 2016-07-21 DIAGNOSIS — M810 Age-related osteoporosis without current pathological fracture: Secondary | ICD-10-CM | POA: Diagnosis not present

## 2016-07-21 DIAGNOSIS — Z87891 Personal history of nicotine dependence: Secondary | ICD-10-CM | POA: Diagnosis not present

## 2016-07-21 DIAGNOSIS — I444 Left anterior fascicular block: Secondary | ICD-10-CM | POA: Diagnosis not present

## 2016-07-21 DIAGNOSIS — R55 Syncope and collapse: Secondary | ICD-10-CM | POA: Diagnosis not present

## 2016-07-21 DIAGNOSIS — I455 Other specified heart block: Secondary | ICD-10-CM | POA: Diagnosis not present

## 2016-07-21 DIAGNOSIS — E039 Hypothyroidism, unspecified: Secondary | ICD-10-CM | POA: Diagnosis not present

## 2016-07-21 DIAGNOSIS — F419 Anxiety disorder, unspecified: Secondary | ICD-10-CM | POA: Diagnosis not present

## 2016-07-21 DIAGNOSIS — L988 Other specified disorders of the skin and subcutaneous tissue: Secondary | ICD-10-CM | POA: Diagnosis not present

## 2016-07-21 DIAGNOSIS — Z7982 Long term (current) use of aspirin: Secondary | ICD-10-CM | POA: Diagnosis not present

## 2016-07-21 DIAGNOSIS — K219 Gastro-esophageal reflux disease without esophagitis: Secondary | ICD-10-CM | POA: Diagnosis not present

## 2016-07-21 DIAGNOSIS — Z79899 Other long term (current) drug therapy: Secondary | ICD-10-CM | POA: Diagnosis not present

## 2016-07-21 DIAGNOSIS — R531 Weakness: Secondary | ICD-10-CM | POA: Diagnosis not present

## 2016-07-21 DIAGNOSIS — I495 Sick sinus syndrome: Secondary | ICD-10-CM | POA: Diagnosis not present

## 2016-07-21 DIAGNOSIS — Z886 Allergy status to analgesic agent status: Secondary | ICD-10-CM | POA: Diagnosis not present

## 2016-07-21 DIAGNOSIS — R9431 Abnormal electrocardiogram [ECG] [EKG]: Secondary | ICD-10-CM | POA: Diagnosis not present

## 2016-07-21 DIAGNOSIS — I4430 Unspecified atrioventricular block: Secondary | ICD-10-CM | POA: Diagnosis not present

## 2016-07-21 NOTE — Telephone Encounter (Signed)
Is this refill appropriate?  It looks like she's been taking losartan. Please advise.

## 2016-07-22 NOTE — Telephone Encounter (Signed)
Ok to fill 

## 2016-07-23 ENCOUNTER — Telehealth: Payer: Self-pay | Admitting: Cardiology

## 2016-07-23 DIAGNOSIS — Z9181 History of falling: Secondary | ICD-10-CM | POA: Diagnosis not present

## 2016-07-23 DIAGNOSIS — E039 Hypothyroidism, unspecified: Secondary | ICD-10-CM | POA: Diagnosis not present

## 2016-07-23 DIAGNOSIS — I455 Other specified heart block: Secondary | ICD-10-CM | POA: Diagnosis not present

## 2016-07-23 DIAGNOSIS — R55 Syncope and collapse: Secondary | ICD-10-CM | POA: Diagnosis not present

## 2016-07-23 DIAGNOSIS — I10 Essential (primary) hypertension: Secondary | ICD-10-CM | POA: Diagnosis not present

## 2016-07-23 NOTE — Telephone Encounter (Signed)
New message   Calling nurse to verify Meds while at patients home. Transferred called to [employer].

## 2016-07-23 NOTE — Telephone Encounter (Signed)
Spoke with pt, she reports an episode of feeling shaky and her heart going really fast this morning. Her bp machine reported bp 100/73 and pulse of 172. She spoke with her granddtr that is a LPN and she told her to take a piece of her xanax. Currently the patient feels better and thinks her heart rate has slowed down. She does not want to recheck it at this time. She reports only taking her thyroid medication today and changes were made in her medications at discharge from the hospital. She is quit confused about what medications she needs to take. She has a nurse coming to her home today at 3 pm to help with her medications. I gave her our number and ask her to have the nurse call me when she is in the home. Patient voiced understanding and again reports she feels better. She denies pre-syncope.

## 2016-07-23 NOTE — Telephone Encounter (Signed)
New message  AJ w/[employer] is calling with a serious EKG results for Dr. Ludwig Clarksrenshaw's nurse  Forde RadonAJ will fax this to us  Can call back if have questions

## 2016-07-23 NOTE — Telephone Encounter (Signed)
°  Follow Up  Called to speak with you per a note she received. Please call back.

## 2016-07-23 NOTE — Telephone Encounter (Signed)
See previous telephone note from today ?

## 2016-07-23 NOTE — Telephone Encounter (Signed)
Spoke with annette, she is currently in the patients home. Sitting bp is 138/80 and standing 120/80 with pulse 147. The patient reports feeling fine with no chest pain, dizziness or SOB. The only medication the patient has taken today is levothyroxine. Advised for the patient to take the metoprolol 100 mg now. Discussed patient issues with dr Jens Somcrenshaw. Patient instructed to stop amlodipine. She will take metoprolol tartrate 100 mg twice daily and losartan 100 mg once daily and aspirin. Dr Jens Somcrenshaw wants the patient to be seen in follow up but she reports she can not come to [place] and she has no one to drive her. Will call and check on the patient tomorrow.

## 2016-07-23 NOTE — Telephone Encounter (Signed)
Retrieved fax-given to debra

## 2016-07-23 NOTE — Telephone Encounter (Signed)
New message  AJ from [employer] is calling in regards to a severe EKG  SBT/162  Will fax notes

## 2016-07-24 ENCOUNTER — Encounter: Payer: Self-pay | Admitting: *Deleted

## 2016-07-24 ENCOUNTER — Telehealth: Payer: Self-pay | Admitting: Cardiology

## 2016-07-24 DIAGNOSIS — R55 Syncope and collapse: Secondary | ICD-10-CM | POA: Diagnosis not present

## 2016-07-24 DIAGNOSIS — I455 Other specified heart block: Secondary | ICD-10-CM | POA: Diagnosis not present

## 2016-07-24 DIAGNOSIS — I10 Essential (primary) hypertension: Secondary | ICD-10-CM | POA: Diagnosis not present

## 2016-07-24 DIAGNOSIS — E039 Hypothyroidism, unspecified: Secondary | ICD-10-CM | POA: Diagnosis not present

## 2016-07-24 DIAGNOSIS — Z9181 History of falling: Secondary | ICD-10-CM | POA: Diagnosis not present

## 2016-07-24 NOTE — Telephone Encounter (Signed)
The patient is wearing an event monitor that ends tonight at midnight and the company reports the patient told them we wanted her to continue the monitor for a longer period of time and they are needing a letter of medical necessity. Will forward for dr Jens Somcrenshaw review

## 2016-07-24 NOTE — Telephone Encounter (Signed)
Letter generated and fax to [phone].

## 2016-07-24 NOTE — Telephone Encounter (Signed)
Ok to extend monitor [employer] Crenshaw

## 2016-07-30 ENCOUNTER — Telehealth: Payer: Self-pay | Admitting: Cardiology

## 2016-07-30 NOTE — Telephone Encounter (Signed)
Spoke to Mrs. Lines. She notes concern regarding her BPs. States she has variably high and low readings, particularly notes that her higher readings are usually in afternoons/evenings.  Notes BP has been trending up since recent med changes... (She takes metoprolol tart 100mg  BID and losartan 100mg . She is not currently on amlodipine, as she was recently instructed to hold this med.)  She checks BP on both arms several times daily. I asked her about trends and she gave me following readings (times w 2 readings are done w both arms):  Wed AM: 131/72 128/62  121/66   Wed PM: 12:25  149/78 132/77  3:02  176/79 154/78  5:45  163/77 151/90  10:25  166/73 158/86  Thu AM: 8:12  128/74 138/77  10:45  140/78  Thu PM: 2:00p 146/ 160/77  4:30p 186/94 209/98  She noted that prior to my phone call, she took a xanax for anxiety. Has yet to feel therapeutic effects. I've encouraged her to relax and give this med 45-60 mins to work. If she still feels anxious/poorly and BP elevated 200/100, I recommended eval/intervention in ED. Pt voiced understanding.  Informed her I would seek advice on BP meds, other interventions.  Has f/u visit w Dr. Jens Somrenshaw in [place] on 1/10 - notes she cannot make appts in [place] due to transportation limits. Requests MD to advise. Dr. Beacher Mayrenshaw OOO, will request review by [employer]. Please inform of any recommendations in advance of appt.

## 2016-07-30 NOTE — Telephone Encounter (Signed)
NEw message  Pt c/o medication issue:  1. Name of Medication: Metoprolol.... Lostartan  2. How are you currently taking this medication (dosage and times per day)? Both medications 100mg    3. Are you having a reaction (difficulty breathing--STAT)? No   4. What is your medication issue? Pt states her medication was changed and her bp has been increasing. Pt states she just took her bp and it read 186/94. Please call back to discuss

## 2016-07-31 NOTE — Telephone Encounter (Signed)
Spoke to patient, communicated recommendations. She voiced thanks and understanding. Will try this re-addition of amlodipine to regimen and notes she's got enough pills on-hand now. Will continue to log BPs and bring diary to upcoming OV. She notes she has home care nurse who is out this week but will return next week. The nurse has been helping her w her medications and BP management. I've encouraged her to call or have nurse call us if she has questions/concerns.

## 2016-07-31 NOTE — Telephone Encounter (Signed)
BP in the am is well controlled but she gets elevated readings in the pm. I would recommend she take amlodipine 2.5 mg around noon daily in addition to the losartan and metoprolol.  Tasheba Henson [place] MD, [facility]

## 2016-08-05 ENCOUNTER — Telehealth: Payer: Self-pay | Admitting: *Deleted

## 2016-08-05 NOTE — Telephone Encounter (Signed)
Spoke with pt, aware of results. She denies any problems with heart racing or feeling faint. She reports her bp is running a little high in the morning when she gets up and then by the end of the day it will also be elevated. She will make sure to bring her bp readings and all of her medications to her follow up appointment. She is not able to make any appointments in [place] due to transportation issues. She will discuss with dr Jens Somcrenshaw at follow up next week. She will call prior to appt with any problems.

## 2016-08-05 NOTE — Telephone Encounter (Signed)
-----   Message from Lewayne BuntingBrian S Crenshaw, MD sent at 08/05/2016  8:28 AM EST ----- Pt with evidence of tachybrady, prolonged pause, paf and SVT; apparently [facility] and metoprolol DCed; needs EP eval for pacemaker and once in place, resumption of beta blocker and initiation of anticoagulation for PAF. No driving. Olga MillersBrian Crenshaw, MD

## 2016-08-06 ENCOUNTER — Other Ambulatory Visit: Payer: Self-pay | Admitting: Family Medicine

## 2016-08-06 NOTE — Progress Notes (Signed)
HPI: FU syncope. Head CT June 2017 showed small vessel disease but no other abnormalities. Echocardiogram in [place] in July 2017 showed normal LV systolic function, grade 1 diastolic dysfunction, mild left atrial enlargement and mild tricuspid regurgitation. Patient seen November 2017 with syncope. An event monitor was ordered and patient was found to have sinus rhythm with paroxysmal atrial fibrillation, supraventricular tachycardia and also a 9 second pause associated with syncope. She apparently was admitted to [facility] and her beta blocker was discontinued. Reports state she had no further bradycardia and was discharged. Note she was having tachycardia when she was in atrial fibrillation and SVT. Patient was instructed to see electrophysiology in [place] but she could not travel that far and declined. Since last seen patient denies dyspnea, chest pain, palpitations. She has had no further syncope by her report.   Current Outpatient Prescriptions  Medication Sig Dispense Refill  . ALPRAZolam (XANAX) 1 MG tablet Take 1 tablet (1 mg total) by mouth at bedtime as needed for anxiety. 90 tablet 0  . amLODipine (NORVASC) 2.5 MG tablet TAKE 1 TABLET (2.5 MG TOTAL) BY MOUTH DAILY. 90 tablet 1  . aspirin EC 81 MG tablet Take 81 mg by mouth daily.    . Lecithin 1200 MG CAPS Take by mouth daily.    Marland Kitchen levothyroxine (SYNTHROID, LEVOTHROID) 137 MCG tablet TAKE 1 TABLET EVERY DAY (Patient taking differently: TAKE 1/2 TABLET EVERY DAY) 90 tablet 1  . losartan (COZAAR) 100 MG tablet TAKE 1 TABLET EVERY DAY 90 tablet 3  . metoprolol (LOPRESSOR) 100 MG tablet TAKE 1 TABLET TWICE DAILY 180 tablet 3  . Misc Natural Products (OSTEO BI-FLEX TRIPLE STRENGTH PO) Take by mouth.    . Multiple Vitamins-Minerals (CENTRUM SILVER PO) Take by mouth.    . Omega-3 Fatty Acids (FISH OIL) 1000 MG CAPS Take 1,000 mg by mouth daily.     Marland Kitchen OVER THE COUNTER MEDICATION Energy greens    . ranitidine (ZANTAC) 150  MG capsule Take 150 mg by mouth 2 (two) times daily.     No current facility-administered medications for this visit.      Past Medical History:  Diagnosis Date  . H. pylori infection    treated  . HOH (hard of hearing)   . Hyperlipidemia   . Hypertension   . Hypothyroidism     Past Surgical History:  Procedure Laterality Date  . APPENDECTOMY    . CHOLECYSTECTOMY    . THYROIDECTOMY     partial for goiter    Social History   Social History  . Marital status: Widowed    Spouse name: N/A  . Number of children: 5  . Years of education: N/A   Occupational History  .  Retired   Social History Main Topics  . Smoking status: Former Games developer  . Smokeless tobacco: Never Used  . Alcohol use No  . Drug use: No  . Sexual activity: Not on file     Comment: housewife, widowed, lives alone, regular exercise   Other Topics Concern  . Not on file   Social History Narrative  . No narrative on file    Family History  Problem Relation Age of Onset  . Cancer Daughter   . Heart disease Sister     unknown specifics    ROS: no fevers or chills, productive cough, hemoptysis, dysphasia, odynophagia, melena, hematochezia, dysuria, hematuria, rash, seizure activity, orthopnea, PND, pedal edema, claudication. Remaining systems are negative.  Physical Exam:  Well-developed well-nourished in no acute distress.  Skin is warm and dry.  HEENT is normal.  Neck is supple. No bruits.  Chest is clear to auscultation with normal expansion.  Cardiovascular exam is regular rate and rhythm.  Abdominal exam nontender or distended. No masses palpated. Extremities show no edema. neuro grossly intact  Electrocardiogram shows sinus rhythm, left anterior fascicular block and no ST changes. A/P  1 Tachybradycardia syndrome-patient has demonstrated tachycardia with her SVT and atrial fibrillation previously and when on beta-blockade had 9 second pause. I believe that she needs a pacemaker. She has  a very poor understanding of this. She states she cannot go to [place] because of transportation issues. I have offered to arrange an [place] and she has agreed. I instructed her not to drive.  2 PAF-monitor showed paroxysmal atrial fibrillation with rapid ventricular response. Her LV function is normal based on previous echo. Recent TSH decreased and free T4 elevated. Her thyroid dose was reduced by primary care. Embolic risk factors include age greater than 9185, female sex, and HTN. CHADSvasc 4. Ideally she would be anticoagulated. However I would be hesitant to do this given history of altered mental status, syncope and documented 9 second pause.   3 Hypertension-blood pressureelevated. She is taking metoprolol 100 mg twice a day. As outlined above she had a 9 second pause on this previously. I will decrease to 50 mg twice a day. Increase amlodipine to 10 mg daily. Follow and adjust as needed.   Olga MillersBrian Devan Babino, MD

## 2016-08-10 DIAGNOSIS — E039 Hypothyroidism, unspecified: Secondary | ICD-10-CM | POA: Diagnosis not present

## 2016-08-10 DIAGNOSIS — R55 Syncope and collapse: Secondary | ICD-10-CM | POA: Diagnosis not present

## 2016-08-10 DIAGNOSIS — I10 Essential (primary) hypertension: Secondary | ICD-10-CM | POA: Diagnosis not present

## 2016-08-10 DIAGNOSIS — I455 Other specified heart block: Secondary | ICD-10-CM | POA: Diagnosis not present

## 2016-08-10 DIAGNOSIS — Z9181 History of falling: Secondary | ICD-10-CM | POA: Diagnosis not present

## 2016-08-12 ENCOUNTER — Encounter: Payer: Self-pay | Admitting: Cardiology

## 2016-08-12 ENCOUNTER — Ambulatory Visit (INDEPENDENT_AMBULATORY_CARE_PROVIDER_SITE_OTHER): Payer: Medicare HMO | Admitting: Cardiology

## 2016-08-12 VITALS — BP 190/88 | HR 61 | Ht 61.0 in | Wt 124.0 lb

## 2016-08-12 DIAGNOSIS — R55 Syncope and collapse: Secondary | ICD-10-CM

## 2016-08-12 DIAGNOSIS — I10 Essential (primary) hypertension: Secondary | ICD-10-CM

## 2016-08-12 DIAGNOSIS — I480 Paroxysmal atrial fibrillation: Secondary | ICD-10-CM

## 2016-08-12 MED ORDER — METOPROLOL TARTRATE 50 MG PO TABS: 50.0000 mg | ORAL_TABLET | Freq: Two times a day (BID) | ORAL | 3 refills | Status: DC

## 2016-08-12 MED ORDER — AMLODIPINE BESYLATE 10 MG PO TABS: 10.0000 mg | ORAL_TABLET | Freq: Every day | ORAL | 3 refills | Status: DC

## 2016-08-12 NOTE — Patient Instructions (Signed)
Medication Instructions:   DECREASE METOPROLOL TO 50 MG TWICE DAILY= 1/2 OF THE 100 MG TABLET TWICE DAILY  INCREASE AMLODIPINE TO 10 MG ONCE DAILY= 4 OF THE 2.5 MG TABLETS ONCE DAILY  Follow-Up:  Your physician recommends that you schedule a follow-up appointment in: 8 WEEKS WITH DR CRENSHAW    APPOINTMENT WITH DR LAI CHOW KOK= [address] 1/61/091/11/18 @ 1 PM. ARRIVE 12.45 PM

## 2016-08-13 ENCOUNTER — Telehealth: Payer: Self-pay | Admitting: *Deleted

## 2016-08-13 DIAGNOSIS — I455 Other specified heart block: Secondary | ICD-10-CM | POA: Diagnosis not present

## 2016-08-13 DIAGNOSIS — I10 Essential (primary) hypertension: Secondary | ICD-10-CM | POA: Diagnosis not present

## 2016-08-13 DIAGNOSIS — I471 Supraventricular tachycardia: Secondary | ICD-10-CM | POA: Diagnosis not present

## 2016-08-13 DIAGNOSIS — R9431 Abnormal electrocardiogram [ECG] [EKG]: Secondary | ICD-10-CM

## 2016-08-13 NOTE — Telephone Encounter (Signed)
Referral placed.

## 2016-08-14 DIAGNOSIS — I455 Other specified heart block: Secondary | ICD-10-CM | POA: Diagnosis not present

## 2016-08-14 DIAGNOSIS — Z9181 History of falling: Secondary | ICD-10-CM | POA: Diagnosis not present

## 2016-08-14 DIAGNOSIS — E039 Hypothyroidism, unspecified: Secondary | ICD-10-CM | POA: Diagnosis not present

## 2016-08-14 DIAGNOSIS — I10 Essential (primary) hypertension: Secondary | ICD-10-CM | POA: Diagnosis not present

## 2016-08-14 DIAGNOSIS — R55 Syncope and collapse: Secondary | ICD-10-CM | POA: Diagnosis not present

## 2016-08-18 DIAGNOSIS — Z9181 History of falling: Secondary | ICD-10-CM | POA: Diagnosis not present

## 2016-08-18 DIAGNOSIS — I10 Essential (primary) hypertension: Secondary | ICD-10-CM | POA: Diagnosis not present

## 2016-08-18 DIAGNOSIS — R55 Syncope and collapse: Secondary | ICD-10-CM | POA: Diagnosis not present

## 2016-08-18 DIAGNOSIS — I455 Other specified heart block: Secondary | ICD-10-CM | POA: Diagnosis not present

## 2016-08-18 DIAGNOSIS — E039 Hypothyroidism, unspecified: Secondary | ICD-10-CM | POA: Diagnosis not present

## 2016-08-20 DIAGNOSIS — I495 Sick sinus syndrome: Secondary | ICD-10-CM | POA: Diagnosis not present

## 2016-08-20 DIAGNOSIS — E039 Hypothyroidism, unspecified: Secondary | ICD-10-CM | POA: Diagnosis not present

## 2016-08-20 DIAGNOSIS — I340 Nonrheumatic mitral (valve) insufficiency: Secondary | ICD-10-CM | POA: Diagnosis not present

## 2016-08-20 DIAGNOSIS — I455 Other specified heart block: Secondary | ICD-10-CM | POA: Diagnosis not present

## 2016-08-20 DIAGNOSIS — I471 Supraventricular tachycardia: Secondary | ICD-10-CM | POA: Diagnosis not present

## 2016-08-20 DIAGNOSIS — I10 Essential (primary) hypertension: Secondary | ICD-10-CM | POA: Diagnosis not present

## 2016-08-20 DIAGNOSIS — I517 Cardiomegaly: Secondary | ICD-10-CM | POA: Diagnosis not present

## 2016-08-20 DIAGNOSIS — I071 Rheumatic tricuspid insufficiency: Secondary | ICD-10-CM | POA: Diagnosis not present

## 2016-08-20 DIAGNOSIS — K219 Gastro-esophageal reflux disease without esophagitis: Secondary | ICD-10-CM | POA: Diagnosis not present

## 2016-08-20 DIAGNOSIS — E785 Hyperlipidemia, unspecified: Secondary | ICD-10-CM | POA: Diagnosis not present

## 2016-08-20 DIAGNOSIS — Z79899 Other long term (current) drug therapy: Secondary | ICD-10-CM | POA: Diagnosis not present

## 2016-08-20 DIAGNOSIS — Z888 Allergy status to other drugs, medicaments and biological substances status: Secondary | ICD-10-CM | POA: Diagnosis not present

## 2016-08-21 DIAGNOSIS — E039 Hypothyroidism, unspecified: Secondary | ICD-10-CM | POA: Diagnosis not present

## 2016-08-21 DIAGNOSIS — I495 Sick sinus syndrome: Secondary | ICD-10-CM | POA: Diagnosis not present

## 2016-08-21 DIAGNOSIS — I10 Essential (primary) hypertension: Secondary | ICD-10-CM | POA: Diagnosis not present

## 2016-08-21 DIAGNOSIS — K219 Gastro-esophageal reflux disease without esophagitis: Secondary | ICD-10-CM | POA: Diagnosis not present

## 2016-08-21 DIAGNOSIS — Z888 Allergy status to other drugs, medicaments and biological substances status: Secondary | ICD-10-CM | POA: Diagnosis not present

## 2016-08-21 DIAGNOSIS — E785 Hyperlipidemia, unspecified: Secondary | ICD-10-CM | POA: Diagnosis not present

## 2016-08-21 DIAGNOSIS — I471 Supraventricular tachycardia: Secondary | ICD-10-CM | POA: Diagnosis not present

## 2016-08-21 DIAGNOSIS — Z79899 Other long term (current) drug therapy: Secondary | ICD-10-CM | POA: Diagnosis not present

## 2016-08-21 DIAGNOSIS — I455 Other specified heart block: Secondary | ICD-10-CM | POA: Diagnosis not present

## 2016-08-22 DIAGNOSIS — I455 Other specified heart block: Secondary | ICD-10-CM | POA: Diagnosis not present

## 2016-08-22 DIAGNOSIS — E785 Hyperlipidemia, unspecified: Secondary | ICD-10-CM | POA: Diagnosis not present

## 2016-08-22 DIAGNOSIS — E039 Hypothyroidism, unspecified: Secondary | ICD-10-CM | POA: Diagnosis not present

## 2016-08-22 DIAGNOSIS — I10 Essential (primary) hypertension: Secondary | ICD-10-CM | POA: Diagnosis not present

## 2016-08-22 DIAGNOSIS — Z79899 Other long term (current) drug therapy: Secondary | ICD-10-CM | POA: Diagnosis not present

## 2016-08-22 DIAGNOSIS — I471 Supraventricular tachycardia: Secondary | ICD-10-CM | POA: Diagnosis not present

## 2016-08-22 DIAGNOSIS — Z950 Presence of cardiac pacemaker: Secondary | ICD-10-CM | POA: Diagnosis not present

## 2016-08-22 DIAGNOSIS — Z888 Allergy status to other drugs, medicaments and biological substances status: Secondary | ICD-10-CM | POA: Diagnosis not present

## 2016-08-22 DIAGNOSIS — K219 Gastro-esophageal reflux disease without esophagitis: Secondary | ICD-10-CM | POA: Diagnosis not present

## 2016-08-22 DIAGNOSIS — I495 Sick sinus syndrome: Secondary | ICD-10-CM | POA: Diagnosis not present

## 2016-08-25 DIAGNOSIS — E039 Hypothyroidism, unspecified: Secondary | ICD-10-CM | POA: Diagnosis not present

## 2016-08-25 DIAGNOSIS — R55 Syncope and collapse: Secondary | ICD-10-CM | POA: Diagnosis not present

## 2016-08-25 DIAGNOSIS — I455 Other specified heart block: Secondary | ICD-10-CM | POA: Diagnosis not present

## 2016-08-25 DIAGNOSIS — I10 Essential (primary) hypertension: Secondary | ICD-10-CM | POA: Diagnosis not present

## 2016-08-25 DIAGNOSIS — Z9181 History of falling: Secondary | ICD-10-CM | POA: Diagnosis not present

## 2016-09-15 ENCOUNTER — Ambulatory Visit: Payer: Commercial Managed Care - HMO | Admitting: Family Medicine

## 2016-09-22 ENCOUNTER — Other Ambulatory Visit: Payer: Self-pay | Admitting: Family Medicine

## 2016-09-22 ENCOUNTER — Other Ambulatory Visit: Payer: Self-pay | Admitting: *Deleted

## 2016-09-22 MED ORDER — LOSARTAN POTASSIUM 100 MG PO TABS: 100.0000 mg | ORAL_TABLET | Freq: Every day | ORAL | 3 refills | Status: DC

## 2016-10-02 DIAGNOSIS — Z950 Presence of cardiac pacemaker: Secondary | ICD-10-CM | POA: Diagnosis not present

## 2016-10-02 DIAGNOSIS — Z45018 Encounter for adjustment and management of other part of cardiac pacemaker: Secondary | ICD-10-CM | POA: Diagnosis not present

## 2016-10-02 DIAGNOSIS — I480 Paroxysmal atrial fibrillation: Secondary | ICD-10-CM | POA: Insufficient documentation

## 2016-10-02 DIAGNOSIS — Z9889 Other specified postprocedural states: Secondary | ICD-10-CM | POA: Diagnosis not present

## 2016-10-02 DIAGNOSIS — I471 Supraventricular tachycardia: Secondary | ICD-10-CM | POA: Diagnosis not present

## 2016-10-02 DIAGNOSIS — I10 Essential (primary) hypertension: Secondary | ICD-10-CM | POA: Diagnosis not present

## 2016-10-14 ENCOUNTER — Ambulatory Visit: Payer: Commercial Managed Care - HMO | Admitting: Cardiology

## 2016-10-23 NOTE — Progress Notes (Signed)
HPI: FU syncope. Patient seen November 2017 with syncope. An event monitor 08-04-16 was ordered and patient was found to have sinus rhythm with paroxysmal atrial fibrillation, supraventricular tachycardia and also a 9 second pause associated with syncope. She apparently was [facility] to [facility] and her beta blocker was discontinued. Reports state she had no further bradycardia and was discharged. Note she was having tachycardia when she was in atrial fibrillation and SVT. Patient was instructed to see electrophysiology in [place] but she could not travel that far and declined. She was thus seen by Dr. Weber CooksKok at [facility]. Transesophageal echocardiogram January 2018 showed normal LV function, mild left atrial enlargement. She subsequently had right atrial tachycardia ablation and pacemaker placement. Since last seen  she denies dyspnea, chest pain. She states she has not had loss of consciousness but she has occasions where she feels like "she is in a dream".   Current Outpatient Prescriptions  Medication Sig Dispense Refill  . ALPRAZolam (XANAX) 1 MG tablet Take 1 tablet (1 mg total) by mouth at bedtime as needed for anxiety. 90 tablet 0  . aMILoride (MIDAMOR) 5 MG tablet Take 5 mg by mouth daily.    Marland Kitchen. amLODipine (NORVASC) 10 MG tablet Take 1 tablet (10 mg total) by mouth daily. 90 tablet 3  . aspirin EC 81 MG tablet Take 81 mg by mouth daily.    . calcium carbonate 1250 MG capsule Take 1,250 mg by mouth 2 (two) times daily with a meal.    . diphenhydrAMINE (BENADRYL) 25 mg capsule Take 25 mg by mouth every 6 (six) hours as needed.    Marland Kitchen. levothyroxine (SYNTHROID, LEVOTHROID) 137 MCG tablet TAKE 1 TABLET EVERY DAY (Patient taking differently: TAKE 1/2 TABLET EVERY DAY) 90 tablet 1  . losartan (COZAAR) 100 MG tablet Take 1 tablet (100 mg total) by mouth daily. 90 tablet 3  . metoprolol (LOPRESSOR) 50 MG tablet Take 1 tablet (50 mg total) by mouth 2 (two) times daily. 180 tablet 3  . Multiple  Vitamins-Minerals (CENTRUM SILVER PO) Take by mouth.    . Omega-3 Fatty Acids (FISH OIL) 1000 MG CAPS Take 1,000 mg by mouth daily.     . ranitidine (ZANTAC) 150 MG capsule Take 150 mg by mouth 2 (two) times daily.     No current facility-administered medications for this visit.      Past Medical History:  Diagnosis Date  . H. pylori infection    treated  . HOH (hard of hearing)   . Hyperlipidemia   . Hypertension   . Hypothyroidism     Past Surgical History:  Procedure Laterality Date  . APPENDECTOMY    . CHOLECYSTECTOMY    . THYROIDECTOMY     partial for goiter    Social History   Social History  . Marital status: Widowed    Spouse name: N/A  . Number of children: 5  . Years of education: N/A   Occupational History  .  Retired   Social History Main Topics  . Smoking status: Former Games developermoker  . Smokeless tobacco: Never Used  . Alcohol use No  . Drug use: No  . Sexual activity: Not on file     Comment: housewife, widowed, lives alone, regular exercise   Other Topics Concern  . Not on file   Social History Narrative  . No narrative on file    Family History  Problem Relation Age of Onset  . Cancer Daughter   . Heart  disease Sister     unknown specifics    ROS: no fevers or chills, productive cough, hemoptysis, dysphasia, odynophagia, melena, hematochezia, dysuria, hematuria, rash, seizure activity, orthopnea, PND, pedal edema, claudication. Remaining systems are negative.  Physical Exam: Well-developed well-nourished in no acute distress.  Skin is warm and dry.  HEENT is normal.  Neck is supple.  Chest is clear to auscultation with normal expansion. Pacemaker site without evidence of infection. Cardiovascular exam is regular rate and rhythm.  Abdominal exam nontender or distended. No masses palpated. Extremities show no edema. neuro grossly intact  A/P  1 Paroxysmal atrial fibrillation-patient in sinus rhythm on examination. Continue metoprolol.  Amiodarone also recently initiated. We will continue. In 6 months we will check TSH, liver functions and chest x-ray. Embolic risk factors include age greater than 20, female sex and hypertension. CHADSvasc 4. She would benefit from anticoagulation long-term but I'm hesitant to initiate as she appears to be confused at times and states she has episodes when she "feels like she's in a dream". She also had a previous syncope. We may consider in the future if she improves. Continue aspirin. Note I explained that she should not drive for at least 6 months following her most recent syncopal episode.   2 Tachycardia bradycardia syndrome-status post pacemaker. She will need follow-up with electrophysiology at [facility].  3 Hypertension-blood pressure elevated. However she did bring records today and her blood pressures typically control. Continue present medications and follow.  Olga Millers, MD

## 2016-10-26 ENCOUNTER — Telehealth: Payer: Self-pay | Admitting: Cardiology

## 2016-10-26 NOTE — Telephone Encounter (Signed)
[facility] on 10/28/16 with Dr Jens Somrenshaw.  Records put with Dr Ludwig Clarksrenshaw's schedule for 10/28/16. lp

## 2016-10-28 ENCOUNTER — Ambulatory Visit (INDEPENDENT_AMBULATORY_CARE_PROVIDER_SITE_OTHER): Payer: Medicare HMO | Admitting: Cardiology

## 2016-10-28 ENCOUNTER — Encounter: Payer: Self-pay | Admitting: Cardiology

## 2016-10-28 VITALS — BP 154/90 | HR 80 | Ht 61.0 in | Wt 122.0 lb

## 2016-10-28 DIAGNOSIS — I10 Essential (primary) hypertension: Secondary | ICD-10-CM

## 2016-10-28 DIAGNOSIS — I480 Paroxysmal atrial fibrillation: Secondary | ICD-10-CM | POA: Diagnosis not present

## 2016-10-28 NOTE — Patient Instructions (Signed)
Your physician wants you to follow-up in: 6 MONTHS WITH DR CRENSHAW You will receive a reminder letter in the mail two months in advance. If you don't receive a letter, please call our office to schedule the follow-up appointment.   If you need a refill on your cardiac medications before your next appointment, please call your pharmacy.  

## 2016-11-05 ENCOUNTER — Ambulatory Visit (INDEPENDENT_AMBULATORY_CARE_PROVIDER_SITE_OTHER): Payer: Medicare HMO | Admitting: Family Medicine

## 2016-11-05 ENCOUNTER — Encounter: Payer: Self-pay | Admitting: Family Medicine

## 2016-11-05 ENCOUNTER — Ambulatory Visit: Payer: Medicare HMO

## 2016-11-05 VITALS — BP 149/73 | HR 52 | Ht 61.0 in | Wt 121.0 lb

## 2016-11-05 DIAGNOSIS — E039 Hypothyroidism, unspecified: Secondary | ICD-10-CM | POA: Diagnosis not present

## 2016-11-05 DIAGNOSIS — R55 Syncope and collapse: Secondary | ICD-10-CM

## 2016-11-05 DIAGNOSIS — I10 Essential (primary) hypertension: Secondary | ICD-10-CM | POA: Diagnosis not present

## 2016-11-05 LAB — COMPLETE METABOLIC PANEL WITH GFR
ALT: 14 U/L (ref 6–29)
AST: 18 U/L (ref 10–35)
Albumin: 4 g/dL (ref 3.6–5.1)
Alkaline Phosphatase: 98 U/L (ref 33–130)
BUN: 15 mg/dL (ref 7–25)
CHLORIDE: 103 mmol/L (ref 98–110)
CO2: 25 mmol/L (ref 20–31)
Calcium: 9.6 mg/dL (ref 8.6–10.4)
Creat: 1.14 mg/dL — ABNORMAL HIGH (ref 0.60–0.88)
GFR, Est African American: 50 mL/min — ABNORMAL LOW (ref >=60)
GFR, Est Non African American: 44 mL/min — ABNORMAL LOW (ref >=60)
GLUCOSE: 97 mg/dL (ref 65–99)
Potassium: 4.3 mmol/L (ref 3.5–5.3)
Sodium: 137 mmol/L (ref 135–146)
Total Bilirubin: 1 mg/dL (ref 0.2–1.2)
Total Protein: 6.7 g/dL (ref 6.1–8.1)

## 2016-11-05 LAB — TSH: TSH: 0.12 m[IU]/L — AB

## 2016-11-05 MED ORDER — ALPRAZOLAM 1 MG PO TABS: 1.0000 mg | ORAL_TABLET | Freq: Every evening | ORAL | 0 refills | Status: DC | PRN

## 2016-11-05 NOTE — Progress Notes (Signed)
Subjective:    CC: HTN  HPI:  A 83-year-old female comes in today follow-up on her blood pressure. She's been having recurrent syncopal events for probably well over year. I have encouraged her multiple times to see cardiology for consultation. She was eventually evaluated as initially she was hesitant to go. She was eventually diagnosed with atrial fibrillation and had a pacemaker placed on 08/20/2016 for sick sinus syndrome. She is followed back up with cardiology a couple times since then.  She did want me to update her medication list. There are a few medications that were incorrect on the papers that she brought from [facility].  Hypothyroidism-she says she is taking the 137 g tab. One of her sheets from [facility] said that she was taking 100 g but she says that is not accurate and wanted to make sure that it was correct in our system.  Past medical history, Surgical history, Family history not pertinant except as noted below, Social history, Allergies, and medications have been entered into the medical record, reviewed, and corrections made.   Review of Systems: No fevers, chills, night sweats, weight loss, chest pain, or shortness of breath.   Objective:    General: Well Developed, well nourished, and in no acute distress.  Neuro: Alert and oriented x3, extra-ocular muscles intact, sensation grossly intact.  HEENT: Normocephalic, atraumatic  Skin: Warm and dry, no rashes. Cardiac: Regular rate and rhythm, no murmurs rubs or gallops, no lower extremity edema.  Respiratory: Clear to auscultation bilaterally. Not using accessory muscles, speaking in full sentences.   Impression and Recommendations:    Hypertension-Reviewed her home blood pressure log. Blood pressures over the last couple of months look fantastic. There are a few lows and a few highs that over all the difference between highs and lows are much smaller than they were previously. Her lowest blood pressure was 108/61 and her  highest was 150/78. Her average running in the 120s to 130s. And her pulse predominantly in the 50s to 60s. Overall her blood pressures look more consistently controlled which is fantastic. Next  Syncope-secondary to sick sinus syndrome now with an pacemaker in place. She's actually been doing well. She said she only had one episode where she felt a little out of it but did not actually pass out.  Memory impairment-I offered to go ahead and do an updated Mini-Mental Status exam today but she had a driver who was waiting on her and declined.  Hypothyroidism-due to recheck TSH today. He is actually supposed to be taking half a tab of her thyroid pill one day a week and a whole pill all other 6 days of the week.

## 2016-12-15 DIAGNOSIS — I495 Sick sinus syndrome: Secondary | ICD-10-CM | POA: Diagnosis not present

## 2016-12-15 DIAGNOSIS — I10 Essential (primary) hypertension: Secondary | ICD-10-CM | POA: Diagnosis not present

## 2016-12-15 DIAGNOSIS — I480 Paroxysmal atrial fibrillation: Secondary | ICD-10-CM | POA: Diagnosis not present

## 2016-12-15 DIAGNOSIS — I471 Supraventricular tachycardia: Secondary | ICD-10-CM | POA: Diagnosis not present

## 2016-12-24 DIAGNOSIS — Z79899 Other long term (current) drug therapy: Secondary | ICD-10-CM | POA: Diagnosis not present

## 2017-01-04 DIAGNOSIS — Z79899 Other long term (current) drug therapy: Secondary | ICD-10-CM | POA: Diagnosis not present

## 2017-01-11 ENCOUNTER — Encounter: Payer: Self-pay | Admitting: Family Medicine

## 2017-01-11 DIAGNOSIS — I10 Essential (primary) hypertension: Secondary | ICD-10-CM | POA: Diagnosis not present

## 2017-01-13 ENCOUNTER — Telehealth: Payer: Self-pay | Admitting: Family Medicine

## 2017-01-13 DIAGNOSIS — I455 Other specified heart block: Secondary | ICD-10-CM | POA: Diagnosis not present

## 2017-01-13 DIAGNOSIS — I10 Essential (primary) hypertension: Secondary | ICD-10-CM

## 2017-01-13 NOTE — Telephone Encounter (Signed)
Pt advised of results, verbalized understanding. No further questions.  

## 2017-01-13 NOTE — Telephone Encounter (Signed)
Call patient: The ultrasound of her renal arteries overall looked okay. No significant narrowing or blockage to her kidneys which is great news.

## 2017-02-04 ENCOUNTER — Ambulatory Visit (INDEPENDENT_AMBULATORY_CARE_PROVIDER_SITE_OTHER): Payer: Medicare HMO | Admitting: Family Medicine

## 2017-02-04 ENCOUNTER — Encounter: Payer: Self-pay | Admitting: Family Medicine

## 2017-02-04 VITALS — BP 110/68 | HR 74 | Resp 16 | Wt 117.9 lb

## 2017-02-04 DIAGNOSIS — Z23 Encounter for immunization: Secondary | ICD-10-CM | POA: Diagnosis not present

## 2017-02-04 DIAGNOSIS — I495 Sick sinus syndrome: Secondary | ICD-10-CM | POA: Diagnosis not present

## 2017-02-04 DIAGNOSIS — Z950 Presence of cardiac pacemaker: Secondary | ICD-10-CM | POA: Insufficient documentation

## 2017-02-04 DIAGNOSIS — I10 Essential (primary) hypertension: Secondary | ICD-10-CM | POA: Diagnosis not present

## 2017-02-04 DIAGNOSIS — R413 Other amnesia: Secondary | ICD-10-CM | POA: Diagnosis not present

## 2017-02-04 DIAGNOSIS — Z95810 Presence of automatic (implantable) cardiac defibrillator: Secondary | ICD-10-CM | POA: Diagnosis not present

## 2017-02-04 MED ORDER — LEVOTHYROXINE SODIUM 112 MCG PO TABS: 112.0000 ug | ORAL_TABLET | Freq: Every day | ORAL | 0 refills | Status: DC

## 2017-02-04 NOTE — Progress Notes (Addendum)
Subjective:    CC: HTN  HPI:  Hypothyroidism-her TSH at last office visit about 3 months ago was too suppressed. There was a miscommunication about how to take her medication.  Hypertension- Pt denies chest pain, SOB, dizziness, or heart palpitations.  Taking meds as directed w/o problems.  Denies medication side effects.  She brought in her blood pressure log. Overall her blood pressures look fantastic. She had a few in the 140s but most of them look great. She did have a few systolics under 100 though.  Note she also  cardiac defibrillator placed after being diagnosed with paroxysmal atrial fibrillation as well as sick sinus syndrome. She been having syncopal episodes over the last probably year and a half. On multiple occasions I have encouraged her to see cardiology but she had declined. She says it they have recently changed her medication. They changed her metoprolol to atenolol.  She is concerned about her memory and wonders if she would be a candidate for memory testing. She notices it is taking her longer to remember things. She says she usually will eventually remember it may just not be in that moment. She spoke to a family member about it. Recommended a medication called Aricept.  Chest the complains of some lip peeling. She says the can skin keeps peeling off. She denies any illicits chopsticks etc. She says it's really not painful or uncomfortable. She wonders if it could be a medication side effect.  Past medical history, Surgical history, Family history not pertinant except as noted below, Social history, Allergies, and medications have been entered into the medical record, reviewed, and corrections made.   Review of Systems: No fevers, chills, night sweats, weight loss, chest pain, or shortness of breath.   Objective:    General: Well Developed, well nourished, and in no acute distress.  Neuro: Alert and oriented x3, extra-ocular muscles intact, sensation grossly intact.   HEENT: Normocephalic, atraumaticNo thyromegaly. No cervical lymphadenopathy. Skin: Warm and dry, no rashes. Cardiac: Regular rate and rhythm, no murmurs rubs or gallops, no lower extremity edema.  Respiratory: Clear to auscultation bilaterally. Not using accessory muscles, speaking in full sentences.   Impression and Recommendations:    Hypothyroidism-we'll decrease levothyroxin down to 112 g daily. Will send over new prescription to the pharmacy so there is less confusion.   HTN - Well controlled. Continue current regimen. Follow up in  6 months.   Cardiac defibrillator- stable.   Memory impairment - MMSE score of 28 today. Passing in 7426 based on age and education. Plan to retest in 6 mo.   Lip peeling-certainly could be a medication side effect though it's not swollen red or uncomfortable. All 6 plain that it can come from other things such as tooth whitening toothpaste etc. She said she does use a tooth waiting to pace. Encouraged her to change that and see if that helps.

## 2017-03-04 ENCOUNTER — Other Ambulatory Visit: Payer: Self-pay | Admitting: Osteopathic Medicine

## 2017-03-04 MED ORDER — RANITIDINE HCL 150 MG PO CAPS: 150.0000 mg | ORAL_CAPSULE | Freq: Two times a day (BID) | ORAL | 3 refills | Status: DC

## 2017-03-04 NOTE — Progress Notes (Signed)
Response to [facility] fax request for Ranitidine

## 2017-04-12 DIAGNOSIS — I4891 Unspecified atrial fibrillation: Secondary | ICD-10-CM | POA: Diagnosis not present

## 2017-04-12 DIAGNOSIS — Z79899 Other long term (current) drug therapy: Secondary | ICD-10-CM | POA: Diagnosis not present

## 2017-04-14 ENCOUNTER — Other Ambulatory Visit: Payer: Self-pay | Admitting: *Deleted

## 2017-04-14 MED ORDER — LEVOTHYROXINE SODIUM 112 MCG PO TABS: 112.0000 ug | ORAL_TABLET | Freq: Every day | ORAL | 0 refills | Status: DC

## 2017-04-19 DIAGNOSIS — I455 Other specified heart block: Secondary | ICD-10-CM | POA: Diagnosis not present

## 2017-04-28 DIAGNOSIS — I4891 Unspecified atrial fibrillation: Secondary | ICD-10-CM | POA: Diagnosis not present

## 2017-04-28 DIAGNOSIS — Z79899 Other long term (current) drug therapy: Secondary | ICD-10-CM | POA: Diagnosis not present

## 2017-06-23 DIAGNOSIS — I480 Paroxysmal atrial fibrillation: Secondary | ICD-10-CM | POA: Diagnosis not present

## 2017-06-23 DIAGNOSIS — I495 Sick sinus syndrome: Secondary | ICD-10-CM | POA: Diagnosis not present

## 2017-06-23 DIAGNOSIS — I471 Supraventricular tachycardia: Secondary | ICD-10-CM | POA: Diagnosis not present

## 2017-07-28 DIAGNOSIS — Z45018 Encounter for adjustment and management of other part of cardiac pacemaker: Secondary | ICD-10-CM | POA: Diagnosis not present

## 2017-07-28 DIAGNOSIS — Z45010 Encounter for checking and testing of cardiac pacemaker pulse generator [battery]: Secondary | ICD-10-CM | POA: Diagnosis not present

## 2017-08-09 ENCOUNTER — Encounter: Payer: Self-pay | Admitting: Family Medicine

## 2017-08-09 ENCOUNTER — Ambulatory Visit: Payer: Medicare HMO | Admitting: Family Medicine

## 2017-08-09 VITALS — BP 160/82 | HR 72 | Ht 61.0 in | Wt 117.0 lb

## 2017-08-09 DIAGNOSIS — L821 Other seborrheic keratosis: Secondary | ICD-10-CM

## 2017-08-09 DIAGNOSIS — R7301 Impaired fasting glucose: Secondary | ICD-10-CM | POA: Diagnosis not present

## 2017-08-09 DIAGNOSIS — R413 Other amnesia: Secondary | ICD-10-CM

## 2017-08-09 DIAGNOSIS — E039 Hypothyroidism, unspecified: Secondary | ICD-10-CM | POA: Diagnosis not present

## 2017-08-09 DIAGNOSIS — I10 Essential (primary) hypertension: Secondary | ICD-10-CM | POA: Diagnosis not present

## 2017-08-09 LAB — POCT GLYCOSYLATED HEMOGLOBIN (HGB A1C): Hemoglobin A1C: 5.6

## 2017-08-09 MED ORDER — ALPRAZOLAM 1 MG PO TABS: 1.0000 mg | ORAL_TABLET | Freq: Every evening | ORAL | 0 refills | Status: DC | PRN

## 2017-08-09 NOTE — Progress Notes (Signed)
Subjective:    CC: BP, memory test.   HPI: Hypertension- Pt denies chest pain, SOB, dizziness, or heart palpitations.  Taking meds as directed w/o problems.  Denies medication side effects.    F/U memory impairment -last Mini-Mental score was 28 passing was 26 based on age and education.  She is here today for 4956-month retest.  Hypothyroidism-last TSH 6 months ago was a little low at 0.1.  She verified that she was taking a half a tab 1 day a week and a whole tab the other 6 days a week.  At that time we decided to recheck it again in 6 months.  He has several lesions on her scalp that she would like me to look at.  She says sometimes she picks them off but they seem to come back.  They are not otherwise painful or bothersome.  Impaired fasting glucose-no increased thirst or urination. No symptoms consistent with hypoglycemia.   Past medical history, Surgical history, Family history not pertinant except as noted below, Social history, Allergies, and medications have been entered into the medical record, reviewed, and corrections made.   Review of Systems: No fevers, chills, night sweats, weight loss, chest pain, or shortness of breath.   Objective:    General: Well Developed, well nourished, and in no acute distress.  Neuro: Alert and oriented x3, extra-ocular muscles intact, sensation grossly intact.  HEENT: Normocephalic, atraumatic  Skin: Warm and dry, no rashes. Cardiac: Regular rate and rhythm, no murmurs rubs or gallops, no lower extremity edema.  Respiratory: Clear to auscultation bilaterally. Not using accessory muscles, speaking in full sentences.   Impression and Recommendations:    HTN - home BPs up and down. Brought in log for us to review.    Memory impairment -MMSE score of 27 today which is down 1 point from previous 6 months ago.  Plan will be to repeat in 6-12 months.  Hypothyroidism- due to recheck the TSH.   Seborrheic  Keratoses- cryotherapy performed.   Patient tolerated well.    IFG -draw hemoglobin A1c looks great at 5.6.  Cryotherapy Procedure Note  Pre-operative Diagnosis: Seborrheic keratoses  Post-operative Diagnosis: same  Locations: 5 lesion on her scalp   Indications: irritation when combing her hair.   Anesthesia: not required    Procedure Details  Patient informed of risks (permanent scarring, infection, light or dark discoloration, bleeding, infection, weakness, numbness and recurrence of the lesion) and benefits of the procedure and verbal informed consent obtained.  The areas are treated with liquid nitrogen therapy, frozen until ice ball extended 2 mm beyond lesion, allowed to thaw, and treated again. The patient tolerated procedure well.  The patient was instructed on post-op care, warned that there may be blister formation, redness and pain. Recommend OTC analgesia as needed for pain.  Condition: Stable  Complications: none.  Plan: 1. Instructed to keep the area dry and covered for 24-48h and clean thereafter. 2. Warning signs of infection were reviewed.   3. Recommended that the patient use OTC acetaminophen as needed for pain.  4. Return iPRN.

## 2017-08-10 ENCOUNTER — Encounter: Payer: Self-pay | Admitting: Family Medicine

## 2017-08-10 ENCOUNTER — Other Ambulatory Visit: Payer: Self-pay | Admitting: *Deleted

## 2017-08-10 LAB — COMPLETE METABOLIC PANEL WITH GFR
AG RATIO: 1.5 (calc) (ref 1.0–2.5)
ALKALINE PHOSPHATASE (APISO): 100 U/L (ref 33–130)
ALT: 19 U/L (ref 6–29)
AST: 22 U/L (ref 10–35)
Albumin: 4.4 g/dL (ref 3.6–5.1)
BUN/Creatinine Ratio: 20 (calc) (ref 6–22)
BUN: 26 mg/dL — ABNORMAL HIGH (ref 7–25)
CALCIUM: 10.1 mg/dL (ref 8.6–10.4)
CHLORIDE: 105 mmol/L (ref 98–110)
CO2: 25 mmol/L (ref 20–32)
Creat: 1.29 mg/dL — ABNORMAL HIGH (ref 0.60–0.88)
GFR, EST NON AFRICAN AMERICAN: 37 mL/min/{1.73_m2} — AB (ref >=60)
GFR, Est African American: 43 mL/min/{1.73_m2} — ABNORMAL LOW (ref >=60)
GLOBULIN: 2.9 g/dL (ref 1.9–3.7)
Glucose, Bld: 103 mg/dL (ref 65–139)
POTASSIUM: 3.9 mmol/L (ref 3.5–5.3)
SODIUM: 135 mmol/L (ref 135–146)
Total Bilirubin: 0.9 mg/dL (ref 0.2–1.2)
Total Protein: 7.3 g/dL (ref 6.1–8.1)

## 2017-08-10 LAB — TSH: TSH: 1.67 m[IU]/L (ref 0.40–4.50)

## 2017-08-10 LAB — LIPID PANEL
CHOL/HDL RATIO: 3.4 (calc) (ref <5.0)
Cholesterol: 265 mg/dL — ABNORMAL HIGH (ref <200)
HDL: 79 mg/dL (ref >50)
LDL CHOLESTEROL (CALC): 161 mg/dL — AB
Non-HDL Cholesterol (Calc): 186 mg/dL (calc) — ABNORMAL HIGH (ref <130)
Triglycerides: 125 mg/dL (ref <150)

## 2017-08-10 MED ORDER — LEVOTHYROXINE SODIUM 112 MCG PO TABS: 112.0000 ug | ORAL_TABLET | Freq: Every day | ORAL | 1 refills | Status: DC

## 2017-08-10 MED ORDER — ALPRAZOLAM 1 MG PO TABS: 1.0000 mg | ORAL_TABLET | Freq: Every evening | ORAL | 0 refills | Status: DC | PRN

## 2017-08-10 MED ORDER — ATENOLOL 50 MG PO TABS: 50.0000 mg | ORAL_TABLET | Freq: Two times a day (BID) | ORAL | 1 refills | Status: DC

## 2017-08-10 MED ORDER — AMLODIPINE BESYLATE 5 MG PO TABS: ORAL_TABLET | ORAL | 1 refills | Status: DC

## 2017-10-04 DIAGNOSIS — I10 Essential (primary) hypertension: Secondary | ICD-10-CM | POA: Diagnosis not present

## 2017-10-04 DIAGNOSIS — I471 Supraventricular tachycardia: Secondary | ICD-10-CM | POA: Diagnosis not present

## 2017-10-04 DIAGNOSIS — Z45010 Encounter for checking and testing of cardiac pacemaker pulse generator [battery]: Secondary | ICD-10-CM | POA: Diagnosis not present

## 2017-10-04 DIAGNOSIS — I480 Paroxysmal atrial fibrillation: Secondary | ICD-10-CM | POA: Diagnosis not present

## 2017-10-04 DIAGNOSIS — Z45018 Encounter for adjustment and management of other part of cardiac pacemaker: Secondary | ICD-10-CM | POA: Diagnosis not present

## 2017-10-04 DIAGNOSIS — I495 Sick sinus syndrome: Secondary | ICD-10-CM | POA: Diagnosis not present

## 2017-10-22 DIAGNOSIS — I4891 Unspecified atrial fibrillation: Secondary | ICD-10-CM | POA: Diagnosis not present

## 2017-10-22 DIAGNOSIS — Z79899 Other long term (current) drug therapy: Secondary | ICD-10-CM | POA: Diagnosis not present

## 2017-11-08 ENCOUNTER — Encounter: Payer: Self-pay | Admitting: Family Medicine

## 2017-11-08 ENCOUNTER — Ambulatory Visit (INDEPENDENT_AMBULATORY_CARE_PROVIDER_SITE_OTHER): Payer: Medicare HMO | Admitting: Family Medicine

## 2017-11-08 VITALS — BP 128/68 | HR 78 | Ht 61.0 in | Wt 115.0 lb

## 2017-11-08 DIAGNOSIS — D692 Other nonthrombocytopenic purpura: Secondary | ICD-10-CM | POA: Diagnosis not present

## 2017-11-08 DIAGNOSIS — E039 Hypothyroidism, unspecified: Secondary | ICD-10-CM

## 2017-11-08 DIAGNOSIS — I10 Essential (primary) hypertension: Secondary | ICD-10-CM | POA: Diagnosis not present

## 2017-11-08 DIAGNOSIS — R7989 Other specified abnormal findings of blood chemistry: Secondary | ICD-10-CM

## 2017-11-08 DIAGNOSIS — E785 Hyperlipidemia, unspecified: Secondary | ICD-10-CM | POA: Diagnosis not present

## 2017-11-08 DIAGNOSIS — L570 Actinic keratosis: Secondary | ICD-10-CM

## 2017-11-08 MED ORDER — ALPRAZOLAM 1 MG PO TABS: 1.0000 mg | ORAL_TABLET | Freq: Every evening | ORAL | 1 refills | Status: DC | PRN

## 2017-11-08 MED ORDER — AMLODIPINE BESYLATE 5 MG PO TABS: 5.0000 mg | ORAL_TABLET | Freq: Every day | ORAL | 1 refills | Status: DC

## 2017-11-08 NOTE — Progress Notes (Addendum)
Subjective:    Patient ID: Tina Graham, female    DOB: 02/02/1929, 84 y.o.   MRN: [id number]  HPI  Hypertension- Pt denies chest pain, SOB, dizziness, or heart palpitations.  Taking meds as directed w/o problems.  Denies medication side effects.  Stop atenolol and amiodarone.  She is still taking amlodipine but only taking it once a day and instead of twice a day.  She is now on carvedilol and feels like her blood pressures have been much better regulated.  She says she was also told that she could stop her aspirin.  She still has a lot of easy bruising but says it has been a little better off the aspirin.  Brought in log of home blood pressures.  Over the last couple of weeks they range from 98/51 up to 141/73.  Overall looking fantastic.  She said she did have an episode a couple of weeks ago that lasted maybe about 20 minutes where she did not pass out like she has in the past but just seemed out of it.  She says her son was asking her questions and she was able to hands answer him but just did not seem like she was thinking and processing normally.  Hypothyroidism -taking her thyroid medication as prescribed for the last time she got a refill the color and shape changed.  I think her brand was changed so she is due for repeat TSH.  Hyperlipidemia-we did labs in January and it showed her LDL was 161.  We discussed having her switch to a different statin but she did not want to change.  She still has a few lesions on her scalp that she would like me to look at today.  She feels like it is better than it was when we did cryotherapy last time they do seem a little smaller but she still has some red crusty areas and one on the dorsum of the right forearm as well.  Review of Systems   BP 128/68   Pulse 78   Ht 5\' 1"  (1.549 m)   Wt 115 lb (52.2 kg)   BMI 21.73 kg/m     Allergies  Allergen Reactions  . Ibuprofen Other (See Comments)    Pt unable to recall reaction Patient is not sure what  happens if she takes this medicine.  . Simvastatin Other (See Comments)    REACTION: myalgias    Past Medical History:  Diagnosis Date  . H. pylori infection    treated  . HOH (hard of hearing)   . Hyperlipidemia   . Hypertension   . Hypothyroidism     Past Surgical History:  Procedure Laterality Date  . APPENDECTOMY    . CHOLECYSTECTOMY    . THYROIDECTOMY     partial for goiter    Social History   Socioeconomic History  . Marital status: Widowed    Spouse name: Not on file  . Number of children: 5  . Years of education: Not on file  . Highest education level: Not on file  Occupational History    Employer: RETIRED  Social Needs  . Financial resource strain: Not on file  . Food insecurity:    Worry: Not on file    Inability: Not on file  . Transportation needs:    Medical: Not on file    Non-medical: Not on file  Tobacco Use  . Smoking status: Former Games developer  . Smokeless tobacco: Never Used  Substance and Sexual Activity  .  Alcohol use: No  . Drug use: No  . Sexual activity: Not on file    Comment: housewife, widowed, lives alone, regular exercise  Lifestyle  . Physical activity:    Days per week: Not on file    Minutes per session: Not on file  . Stress: Not on file  Relationships  . Social connections:    Talks on phone: Not on file    Gets together: Not on file    Attends religious service: Not on file    Active member of club or organization: Not on file    Attends meetings of clubs or organizations: Not on file    Relationship status: Not on file  . Intimate partner violence:    Fear of current or ex partner: Not on file    Emotionally abused: Not on file    Physically abused: Not on file    Forced sexual activity: Not on file  Other Topics Concern  . Not on file  Social History Narrative  . Not on file    Family History  Problem Relation Age of Onset  . Cancer Daughter   . Heart disease Sister        unknown specifics    Outpatient  Encounter Medications as of 11/08/2017  Medication Sig  . ALPRAZolam (XANAX) 1 MG tablet Take 1 tablet (1 mg total) by mouth at bedtime as needed for anxiety.  Marland Kitchen amLODipine (NORVASC) 5 MG tablet Take 1 tablet (5 mg total) by mouth daily. Take 1 tab in the morning and 1.5 at night as needed  . carvedilol (COREG) 25 MG tablet   . levothyroxine (SYNTHROID, LEVOTHROID) 112 MCG tablet Take 1 tablet (112 mcg total) by mouth daily.  . Multiple Vitamins-Minerals (CENTRUM SILVER PO) Take by mouth.  . Omega-3 Fatty Acids (FISH OIL) 1000 MG CAPS Take 1,000 mg by mouth daily.   . ranitidine (ZANTAC) 150 MG capsule Take 1 capsule (150 mg total) by mouth 2 (two) times daily.  . [DISCONTINUED] ALPRAZolam (XANAX) 1 MG tablet Take 1 tablet (1 mg total) by mouth at bedtime as needed for anxiety.  . [DISCONTINUED] amLODipine (NORVASC) 5 MG tablet Take 1 tab in the morning and 1.5 at night as needed  . [DISCONTINUED] atenolol (TENORMIN) 50 MG tablet Take 1 tablet (50 mg total) by mouth 2 (two) times daily.   No facility-administered encounter medications on file as of 11/08/2017.           Objective:   Physical Exam  Constitutional: She is oriented to person, place, and time. She appears well-developed and well-nourished.  HENT:  Head: Normocephalic and atraumatic.  Right Ear: External ear normal.  Left Ear: External ear normal.  Eyes: Conjunctivae are normal.  Cardiovascular: Normal rate, regular rhythm and normal heart sounds.  Pulmonary/Chest: Effort normal and breath sounds normal.  Neurological: She is alert and oriented to person, place, and time.  Skin: Skin is warm and dry.  Is a lot of bruising and permanent hyperpigmentation on her forearms bilaterally.  She also had erythematous scaly lesions on the top of her scalp right where she parts her hair.  She also has one on the dorsum of the right distal forearm.  Psychiatric: She has a normal mood and affect. Her behavior is normal.           Assessment & Plan:  HTN - home BPs well controlled.  Blood pressure looks great today.  Due for repeat BMP as she looked a little  extra dry on recent blood work.  Occasional list updated today.  Abnormal creatinine from labs in January-due to repeat today.  She says she never made it back in for follow-up.  Hypothyroidism -due to recheck TSH since her brand of thyroid medication was changed recently due to back order issues.  Will call with results.  Can refill medication if everything looks good.  Hyperlipidemia-she reports she is still taking her statin even though her last LDL was not therapeutic but she does not want to change it.  There is some study showing that at least taking a statin is beneficial so hopefully she is trying to be consistent with it.  Senile purpura - better off her ASA.    Actinic keratoses-recommend cryotherapy for treatment.  Patient tolerated procedure well.  Cryotherapy Procedure Note  Pre-operative Diagnosis: Actinic keratosis  Post-operative Diagnosis: Actinic keratosis  Locations: scalp 5 lesions, and right forearm ( 1 lesion).    Indications: Pre-cancerous  Anesthesia: not required    Procedure Details  Patient informed of risks (permanent scarring, infection, light or dark discoloration, bleeding, infection, weakness, numbness and recurrence of the lesion) and benefits of the procedure and verbal informed consent obtained.  The areas are treated with liquid nitrogen therapy, frozen until ice ball extended 2 mm beyond lesion, allowed to thaw, and treated again. The patient tolerated procedure well.  The patient was instructed on post-op care, warned that there may be blister formation, redness and pain. Recommend OTC analgesia as needed for pain.  Condition: Stable  Complications: none.  Plan: 1. Instructed to keep the area dry and covered for 24-48h and clean thereafter. 2. Warning signs of infection were reviewed.   3. Recommended that the  patient use OTC acetaminophen as needed for pain.  4. Return PRN.

## 2017-11-09 LAB — BASIC METABOLIC PANEL WITH GFR
BUN / CREAT RATIO: 16 (calc) (ref 6–22)
BUN: 17 mg/dL (ref 7–25)
CALCIUM: 9.8 mg/dL (ref 8.6–10.4)
CO2: 29 mmol/L (ref 20–32)
Chloride: 104 mmol/L (ref 98–110)
Creat: 1.09 mg/dL — ABNORMAL HIGH (ref 0.60–0.88)
GFR, EST AFRICAN AMERICAN: 53 mL/min/{1.73_m2} — AB (ref >=60)
GFR, EST NON AFRICAN AMERICAN: 46 mL/min/{1.73_m2} — AB (ref >=60)
Glucose, Bld: 106 mg/dL — ABNORMAL HIGH (ref 65–99)
Potassium: 3.9 mmol/L (ref 3.5–5.3)
Sodium: 141 mmol/L (ref 135–146)

## 2017-11-09 LAB — TSH: TSH: 0.5 mIU/L (ref 0.40–4.50)

## 2018-01-03 ENCOUNTER — Other Ambulatory Visit: Payer: Self-pay | Admitting: Family Medicine

## 2018-01-10 DIAGNOSIS — Z45018 Encounter for adjustment and management of other part of cardiac pacemaker: Secondary | ICD-10-CM | POA: Diagnosis not present

## 2018-01-10 DIAGNOSIS — Z45010 Encounter for checking and testing of cardiac pacemaker pulse generator [battery]: Secondary | ICD-10-CM | POA: Diagnosis not present

## 2018-01-19 DIAGNOSIS — Z79899 Other long term (current) drug therapy: Secondary | ICD-10-CM | POA: Diagnosis not present

## 2018-01-19 DIAGNOSIS — I4891 Unspecified atrial fibrillation: Secondary | ICD-10-CM | POA: Diagnosis not present

## 2018-01-25 DIAGNOSIS — Z79899 Other long term (current) drug therapy: Secondary | ICD-10-CM | POA: Diagnosis not present

## 2018-01-25 DIAGNOSIS — I4891 Unspecified atrial fibrillation: Secondary | ICD-10-CM | POA: Diagnosis not present

## 2018-01-31 ENCOUNTER — Other Ambulatory Visit: Payer: Self-pay

## 2018-01-31 ENCOUNTER — Ambulatory Visit (INDEPENDENT_AMBULATORY_CARE_PROVIDER_SITE_OTHER): Payer: Medicare HMO | Admitting: Family Medicine

## 2018-01-31 ENCOUNTER — Encounter: Payer: Self-pay | Admitting: Family Medicine

## 2018-01-31 ENCOUNTER — Other Ambulatory Visit: Payer: Self-pay | Admitting: Family Medicine

## 2018-01-31 VITALS — BP 127/60 | Ht 61.0 in | Wt 114.0 lb

## 2018-01-31 DIAGNOSIS — I10 Essential (primary) hypertension: Secondary | ICD-10-CM | POA: Diagnosis not present

## 2018-01-31 DIAGNOSIS — R404 Transient alteration of awareness: Secondary | ICD-10-CM

## 2018-01-31 DIAGNOSIS — R7301 Impaired fasting glucose: Secondary | ICD-10-CM

## 2018-01-31 LAB — POCT GLYCOSYLATED HEMOGLOBIN (HGB A1C): HEMOGLOBIN A1C: 6.4 % — AB (ref 4.0–5.6)

## 2018-01-31 NOTE — Progress Notes (Signed)
Subjective:    Patient ID: Tina Graham, female    DOB: 1928-08-07, 84 y.o.   MRN: [id number]  HPI 84 year old female comes in today with a history of sick sinus syndrome and paroxysmal atrial fibrillation status post pacemaker complaining of 3 "spells" of where she lost track of what she saying. She denies passing out.  Usually happens in the evening and last about 5 min and says takes about 15 min to recover.  Says she tried taking a piece of her nerve pilll.  She did notice her pulse was elevated when happened last night.   She just had a pacemaker check on June 11 with her cardiologist at [facility].  1 of the episodes was just by her son who said that she just sat there and "chewed".  She was sitting down when it happened and was not standing up.  She also brought in a home log of blood pressures- most BPS look great. Some that are high and some low.  Lowest 101/58. 154/79 is the highest.    Impaired fasting glucose-no increased thirst or urination. No symptoms consistent with hypoglycemia.  She has noted that her right arm feels more weak than her left side though this is not new.  It is more chronic she also has more pain in her right leg compared to her left side.  Review of Systems   She denies any chest pain, shortness of breath, warning symptoms before it happens in fact she is not aware that it actually happens until someone says something to her.  Her son witnessed 1 of the episodes and her daughter witnessed another one.  She does not have any shaking like a generalized seizure.  She does have a daughter with a history of seizure disorder.  Ht 5\' 1"  (1.549 m)   Wt 114 lb (51.7 kg)   SpO2 99%   BMI 21.54 kg/m     Allergies  Allergen Reactions  . Ibuprofen Other (See Comments)    Pt unable to recall reaction Patient is not sure what happens if she takes this medicine.  . Simvastatin Other (See Comments)    REACTION: myalgias    Past Medical History:  Diagnosis Date  . H.  pylori infection    treated  . HOH (hard of hearing)   . Hyperlipidemia   . Hypertension   . Hypothyroidism     Past Surgical History:  Procedure Laterality Date  . APPENDECTOMY    . CHOLECYSTECTOMY    . THYROIDECTOMY     partial for goiter    Social History   Socioeconomic History  . Marital status: Widowed    Spouse name: Not on file  . Number of children: 5  . Years of education: Not on file  . Highest education level: Not on file  Occupational History    Employer: RETIRED  Social Needs  . Financial resource strain: Not on file  . Food insecurity:    Worry: Not on file    Inability: Not on file  . Transportation needs:    Medical: Not on file    Non-medical: Not on file  Tobacco Use  . Smoking status: Former Games developer  . Smokeless tobacco: Never Used  Substance and Sexual Activity  . Alcohol use: No  . Drug use: No  . Sexual activity: Not on file    Comment: housewife, widowed, lives alone, regular exercise  Lifestyle  . Physical activity:    Days per week: Not on file  Minutes per session: Not on file  . Stress: Not on file  Relationships  . Social connections:    Talks on phone: Not on file    Gets together: Not on file    Attends religious service: Not on file    Active member of club or organization: Not on file    Attends meetings of clubs or organizations: Not on file    Relationship status: Not on file  . Intimate partner violence:    Fear of current or ex partner: Not on file    Emotionally abused: Not on file    Physically abused: Not on file    Forced sexual activity: Not on file  Other Topics Concern  . Not on file  Social History Narrative  . Not on file    Family History  Problem Relation Age of Onset  . Cancer Daughter   . Heart disease Sister        unknown specifics    Outpatient Encounter Medications as of 01/31/2018  Medication Sig  . ALPRAZolam (XANAX) 1 MG tablet Take 1 tablet (1 mg total) by mouth at bedtime as needed for  anxiety.  Marland Kitchen. amLODipine (NORVASC) 5 MG tablet Take 1 tablet (5 mg total) by mouth daily. Take 1 tab in the morning and 1.5 at night as needed  . carvedilol (COREG) 25 MG tablet   . levothyroxine (SYNTHROID, LEVOTHROID) 112 MCG tablet TAKE 1 TABLET DAILY  . Multiple Vitamins-Minerals (CENTRUM SILVER PO) Take by mouth.  . Omega-3 Fatty Acids (FISH OIL) 1000 MG CAPS Take 1,000 mg by mouth daily.   . ranitidine (ZANTAC) 150 MG capsule Take 1 capsule (150 mg total) by mouth 2 (two) times daily.   No facility-administered encounter medications on file as of 01/31/2018.          Objective:   Physical Exam  Constitutional: She is oriented to person, place, and time. She appears well-developed and well-nourished.  HENT:  Head: Normocephalic and atraumatic.  Right Ear: External ear normal.  Left Ear: External ear normal.  Nose: Nose normal.  Eyes: Conjunctivae are normal.  Neck: Neck supple. No thyromegaly present.  Cardiovascular: Normal rate, regular rhythm and normal heart sounds.  No carotid bruits  Pulmonary/Chest: Effort normal and breath sounds normal.  Lymphadenopathy:    She has no cervical adenopathy.  Neurological: She is alert and oriented to person, place, and time.  Gait is normal.   Skin: Skin is warm and dry.  Psychiatric: She has a normal mood and affect. Her behavior is normal.          Assessment & Plan:  Altered consciousness spells-unclear etiology.  These could be mini strokes versus some type of seizure disorder.  Because of her pacemaker I would like to start by getting a CT of the brain for further work-up.  If negative then plan will be to refer to neurology to evaluate for possible seizure disorder and refer back to cardiology so that they can see if she is up-to-date on carotid artery scanning etc.  IFG -11 A1c went up to 6.4.  Is a significant jump from previous.  She says more recently she is been try to do better with controlling her intake of sugar  products.  Will follow carefully and plan to recheck again in 3 to 4 months.  Hypertension-Based on home blood pressure logs overall her blood pressures look at goal.

## 2018-02-01 DIAGNOSIS — I10 Essential (primary) hypertension: Secondary | ICD-10-CM | POA: Diagnosis not present

## 2018-02-01 LAB — CREATININE, SERUM: CREATININE: 0.96 mg/dL — AB (ref 0.60–0.88)

## 2018-02-04 ENCOUNTER — Encounter (HOSPITAL_BASED_OUTPATIENT_CLINIC_OR_DEPARTMENT_OTHER): Payer: Self-pay

## 2018-02-04 ENCOUNTER — Ambulatory Visit (HOSPITAL_BASED_OUTPATIENT_CLINIC_OR_DEPARTMENT_OTHER)
Admission: RE | Admit: 2018-02-04 | Discharge: 2018-02-04 | Disposition: A | Discharge status: Home / Self Care | Payer: Medicare HMO | Source: Ambulatory Visit | Attending: Family Medicine | Admitting: Family Medicine

## 2018-02-04 DIAGNOSIS — R42 Dizziness and giddiness: Secondary | ICD-10-CM | POA: Diagnosis not present

## 2018-02-04 DIAGNOSIS — R404 Transient alteration of awareness: Secondary | ICD-10-CM | POA: Diagnosis not present

## 2018-02-04 MED ORDER — IOPAMIDOL (ISOVUE-300) INJECTION 61%
100.0000 mL | Freq: Once | INTRAVENOUS | Status: AC | PRN
  Administered 2018-02-04: 75 mL via INTRAVENOUS

## 2018-02-07 ENCOUNTER — Encounter: Payer: Self-pay | Admitting: *Deleted

## 2018-02-07 ENCOUNTER — Other Ambulatory Visit: Payer: Self-pay | Admitting: *Deleted

## 2018-02-07 DIAGNOSIS — E785 Hyperlipidemia, unspecified: Secondary | ICD-10-CM

## 2018-02-07 DIAGNOSIS — R404 Transient alteration of awareness: Secondary | ICD-10-CM

## 2018-02-07 MED ORDER — ATORVASTATIN CALCIUM 40 MG PO TABS: 40.0000 mg | ORAL_TABLET | Freq: Every day | ORAL | 3 refills | Status: DC

## 2018-02-17 DIAGNOSIS — R413 Other amnesia: Secondary | ICD-10-CM | POA: Diagnosis not present

## 2018-02-17 DIAGNOSIS — R531 Weakness: Secondary | ICD-10-CM | POA: Diagnosis not present

## 2018-02-17 DIAGNOSIS — R55 Syncope and collapse: Secondary | ICD-10-CM | POA: Diagnosis not present

## 2018-02-17 DIAGNOSIS — G40209 Localization-related (focal) (partial) symptomatic epilepsy and epileptic syndromes with complex partial seizures, not intractable, without status epilepticus: Secondary | ICD-10-CM | POA: Diagnosis not present

## 2018-02-17 DIAGNOSIS — R202 Paresthesia of skin: Secondary | ICD-10-CM | POA: Diagnosis not present

## 2018-02-17 DIAGNOSIS — E531 Pyridoxine deficiency: Secondary | ICD-10-CM | POA: Diagnosis not present

## 2018-02-17 DIAGNOSIS — G3184 Mild cognitive impairment, so stated: Secondary | ICD-10-CM | POA: Diagnosis not present

## 2018-02-25 ENCOUNTER — Other Ambulatory Visit: Payer: Self-pay | Admitting: Family Medicine

## 2018-03-09 DIAGNOSIS — R531 Weakness: Secondary | ICD-10-CM | POA: Diagnosis not present

## 2018-03-09 DIAGNOSIS — R202 Paresthesia of skin: Secondary | ICD-10-CM | POA: Diagnosis not present

## 2018-03-09 DIAGNOSIS — G40209 Localization-related (focal) (partial) symptomatic epilepsy and epileptic syndromes with complex partial seizures, not intractable, without status epilepticus: Secondary | ICD-10-CM | POA: Diagnosis not present

## 2018-03-09 DIAGNOSIS — R270 Ataxia, unspecified: Secondary | ICD-10-CM | POA: Diagnosis not present

## 2018-03-10 ENCOUNTER — Encounter: Payer: Self-pay | Admitting: Family Medicine

## 2018-03-10 ENCOUNTER — Ambulatory Visit (INDEPENDENT_AMBULATORY_CARE_PROVIDER_SITE_OTHER): Payer: Medicare HMO | Admitting: Family Medicine

## 2018-03-10 VITALS — BP 122/74 | HR 65 | Ht 61.0 in | Wt 115.0 lb

## 2018-03-10 DIAGNOSIS — E039 Hypothyroidism, unspecified: Secondary | ICD-10-CM | POA: Diagnosis not present

## 2018-03-10 DIAGNOSIS — N183 Chronic kidney disease, stage 3 unspecified: Secondary | ICD-10-CM

## 2018-03-10 DIAGNOSIS — I10 Essential (primary) hypertension: Secondary | ICD-10-CM

## 2018-03-10 DIAGNOSIS — R413 Other amnesia: Secondary | ICD-10-CM | POA: Diagnosis not present

## 2018-03-10 LAB — BASIC METABOLIC PANEL WITH GFR
BUN / CREAT RATIO: 20 (calc) (ref 6–22)
BUN: 19 mg/dL (ref 7–25)
CO2: 29 mmol/L (ref 20–32)
CREATININE: 0.95 mg/dL — AB (ref 0.60–0.88)
Calcium: 9.4 mg/dL (ref 8.6–10.4)
Chloride: 104 mmol/L (ref 98–110)
GFR, Est African American: 62 mL/min/{1.73_m2} (ref >=60)
GFR, Est Non African American: 54 mL/min/{1.73_m2} — ABNORMAL LOW (ref >=60)
GLUCOSE: 105 mg/dL — AB (ref 65–99)
Potassium: 4.4 mmol/L (ref 3.5–5.3)
SODIUM: 140 mmol/L (ref 135–146)

## 2018-03-10 LAB — TSH: TSH: 0.23 m[IU]/L — AB (ref 0.40–4.50)

## 2018-03-10 NOTE — Progress Notes (Signed)
Subjective:    CC: HTN  HPI: Hypertension- Pt denies chest pain, SOB, dizziness, or heart palpitations.  Taking meds as directed w/o problems.  Denies medication side effects.    Hypothyroidism - Taking medication regularly in the AM away from food and vitamins, etc. No recent change to skin, hair, or energy levels.  She has seen Neurology. I don't have any notes but per pt she has been started on Lamictal.  She is now up to 2 tabs twice a day.  I am not clear if they diagnosed her with possible seizure disorder.  She has had episodes of staring off and being unresponsive.  She says since starting Lamictal she is only had about 3 episodes and has not had any in the last week. CT revealed old stroke and small vessel ischemic change..   Past medical history, Surgical history, Family history not pertinant except as noted below, Social history, Allergies, and medications have been entered into the medical record, reviewed, and corrections made.   Review of Systems: No fevers, chills, night sweats, weight loss, chest pain, or shortness of breath.   Objective:    General: Well Developed, well nourished, and in no acute distress.  Neuro: Alert and oriented x3, extra-ocular muscles intact, sensation grossly intact.  HEENT: Normocephalic, atraumatic  Skin: Warm and dry, no rashes. Cardiac: Regular rate and rhythm, no murmurs rubs or gallops, no lower extremity edema.  Respiratory: Clear to auscultation bilaterally. Not using accessory muscles, speaking in full sentences.   Impression and Recommendations:    HTN - BP s have been running low on her home numbers.  Quite a few numbers that are running between 80 systolic and 105 systolic.  Some to actually have her cut her for carvedilol in half.  Will up in about 4 months.  Hypothyroid - due to recheck since last TSH was a little low.    Possible seizure disorder?-We will call and get records to clarify exactly.  Unfortunately with her memory  impairment she is not the best historian to I would like to get further information.  But it does seem like she is actually doing better on Lamictal which is very reassuring.  Memory impairment-she had difficulty processing some things today like going to the lab to definitely keep an eye on this.  She is no longer driving but she does not have any family members coming with her to office visits.  Will do MMSE at next OV>    History of prior stroke-now on a statin.  Questionable benefit since she is [age] but right now she is tolerating it well so we will discontinue it.  CKD 3 - due to recheck renal function.

## 2018-03-10 NOTE — Patient Instructions (Signed)
OK to cut your carvedilol in half to help with the low blood pressure.

## 2018-03-14 ENCOUNTER — Other Ambulatory Visit: Payer: Self-pay | Admitting: Family Medicine

## 2018-03-14 DIAGNOSIS — E039 Hypothyroidism, unspecified: Secondary | ICD-10-CM

## 2018-03-14 NOTE — Progress Notes (Signed)
See lab notes from TSH check on 03-10-18

## 2018-03-21 DIAGNOSIS — H353132 Nonexudative age-related macular degeneration, bilateral, intermediate dry stage: Secondary | ICD-10-CM | POA: Diagnosis not present

## 2018-03-31 DIAGNOSIS — G5603 Carpal tunnel syndrome, bilateral upper limbs: Secondary | ICD-10-CM | POA: Diagnosis not present

## 2018-03-31 DIAGNOSIS — M5417 Radiculopathy, lumbosacral region: Secondary | ICD-10-CM | POA: Diagnosis not present

## 2018-03-31 DIAGNOSIS — G3184 Mild cognitive impairment, so stated: Secondary | ICD-10-CM | POA: Diagnosis not present

## 2018-03-31 DIAGNOSIS — M5412 Radiculopathy, cervical region: Secondary | ICD-10-CM | POA: Diagnosis not present

## 2018-03-31 DIAGNOSIS — R202 Paresthesia of skin: Secondary | ICD-10-CM | POA: Diagnosis not present

## 2018-03-31 DIAGNOSIS — R2243 Localized swelling, mass and lump, lower limb, bilateral: Secondary | ICD-10-CM | POA: Diagnosis not present

## 2018-03-31 DIAGNOSIS — G40209 Localization-related (focal) (partial) symptomatic epilepsy and epileptic syndromes with complex partial seizures, not intractable, without status epilepticus: Secondary | ICD-10-CM | POA: Diagnosis not present

## 2018-03-31 DIAGNOSIS — R531 Weakness: Secondary | ICD-10-CM | POA: Diagnosis not present

## 2018-03-31 DIAGNOSIS — R609 Edema, unspecified: Secondary | ICD-10-CM | POA: Diagnosis not present

## 2018-04-11 DIAGNOSIS — E039 Hypothyroidism, unspecified: Secondary | ICD-10-CM | POA: Diagnosis not present

## 2018-04-12 LAB — TSH: TSH: 0.11 m[IU]/L — AB (ref 0.40–4.50)

## 2018-04-14 ENCOUNTER — Other Ambulatory Visit: Payer: Self-pay | Admitting: Family Medicine

## 2018-04-14 ENCOUNTER — Other Ambulatory Visit: Payer: Self-pay | Admitting: *Deleted

## 2018-04-14 DIAGNOSIS — R7989 Other specified abnormal findings of blood chemistry: Secondary | ICD-10-CM

## 2018-04-14 DIAGNOSIS — E039 Hypothyroidism, unspecified: Secondary | ICD-10-CM

## 2018-04-14 DIAGNOSIS — R634 Abnormal weight loss: Secondary | ICD-10-CM

## 2018-04-14 MED ORDER — LEVOTHYROXINE SODIUM 88 MCG PO TABS
88.0000 ug | ORAL_TABLET | Freq: Every day | ORAL | 0 refills | Status: DC
Start: 2018-04-14 — End: 2018-05-10

## 2018-04-18 DIAGNOSIS — Z45018 Encounter for adjustment and management of other part of cardiac pacemaker: Secondary | ICD-10-CM | POA: Diagnosis not present

## 2018-04-18 DIAGNOSIS — Z45010 Encounter for checking and testing of cardiac pacemaker pulse generator [battery]: Secondary | ICD-10-CM | POA: Diagnosis not present

## 2018-05-05 ENCOUNTER — Other Ambulatory Visit: Payer: Self-pay

## 2018-05-05 DIAGNOSIS — E039 Hypothyroidism, unspecified: Secondary | ICD-10-CM | POA: Diagnosis not present

## 2018-05-06 LAB — TSH: TSH: 2.16 m[IU]/L (ref 0.40–4.50)

## 2018-05-10 ENCOUNTER — Other Ambulatory Visit: Payer: Self-pay | Admitting: Family Medicine

## 2018-05-10 ENCOUNTER — Telehealth: Payer: Self-pay

## 2018-05-10 MED ORDER — LEVOTHYROXINE SODIUM 88 MCG PO TABS: 88.0000 ug | ORAL_TABLET | Freq: Every day | ORAL | 0 refills | Status: DC

## 2018-05-10 NOTE — Telephone Encounter (Signed)
Pt needs RF for Levothyroxine to go to mail order. Per last labs: "Call patient: Her thyroid looks fantastic this time."  RF sent for 90 days.

## 2018-05-12 DIAGNOSIS — M545 Low back pain: Secondary | ICD-10-CM | POA: Diagnosis not present

## 2018-05-12 DIAGNOSIS — R202 Paresthesia of skin: Secondary | ICD-10-CM | POA: Diagnosis not present

## 2018-05-12 DIAGNOSIS — I639 Cerebral infarction, unspecified: Secondary | ICD-10-CM | POA: Diagnosis not present

## 2018-05-12 DIAGNOSIS — G5603 Carpal tunnel syndrome, bilateral upper limbs: Secondary | ICD-10-CM | POA: Diagnosis not present

## 2018-05-12 DIAGNOSIS — M5412 Radiculopathy, cervical region: Secondary | ICD-10-CM | POA: Diagnosis not present

## 2018-05-13 ENCOUNTER — Other Ambulatory Visit: Payer: Self-pay | Admitting: Family Medicine

## 2018-05-14 ENCOUNTER — Other Ambulatory Visit: Payer: Self-pay | Admitting: Family Medicine

## 2018-05-16 ENCOUNTER — Other Ambulatory Visit: Payer: Self-pay

## 2018-05-16 ENCOUNTER — Other Ambulatory Visit: Payer: Self-pay | Admitting: *Deleted

## 2018-05-16 MED ORDER — AMLODIPINE BESYLATE 5 MG PO TABS: ORAL_TABLET | ORAL | 0 refills | Status: DC

## 2018-05-16 MED ORDER — LEVOTHYROXINE SODIUM 88 MCG PO TABS: 88.0000 ug | ORAL_TABLET | Freq: Every day | ORAL | 0 refills | Status: DC

## 2018-05-16 NOTE — Telephone Encounter (Signed)
Pt called- RXs sent to mail order 05/10/18 and they have still not been delivered. I called mail order and they have not shipped them yet, but will try to send them today. Pt can expect them this week. Pt requesting RX for amlodipine and levothyroxine be sent to [facility] for 1 week supply to hold her over

## 2018-05-30 ENCOUNTER — Other Ambulatory Visit: Payer: Self-pay | Admitting: Family Medicine

## 2018-05-30 NOTE — Telephone Encounter (Signed)
Routed to pcp for signature.Kordell Jafri Lynetta, CMA  

## 2018-06-23 DIAGNOSIS — G3184 Mild cognitive impairment, so stated: Secondary | ICD-10-CM | POA: Diagnosis not present

## 2018-06-23 DIAGNOSIS — M5412 Radiculopathy, cervical region: Secondary | ICD-10-CM | POA: Diagnosis not present

## 2018-06-23 DIAGNOSIS — M545 Low back pain: Secondary | ICD-10-CM | POA: Diagnosis not present

## 2018-06-23 DIAGNOSIS — G5603 Carpal tunnel syndrome, bilateral upper limbs: Secondary | ICD-10-CM | POA: Diagnosis not present

## 2018-06-23 DIAGNOSIS — R42 Dizziness and giddiness: Secondary | ICD-10-CM | POA: Diagnosis not present

## 2018-07-10 ENCOUNTER — Other Ambulatory Visit: Payer: Self-pay | Admitting: Family Medicine

## 2018-07-11 ENCOUNTER — Ambulatory Visit: Payer: Medicare HMO | Admitting: Family Medicine

## 2018-07-11 DIAGNOSIS — Z45018 Encounter for adjustment and management of other part of cardiac pacemaker: Secondary | ICD-10-CM | POA: Diagnosis not present

## 2018-07-11 DIAGNOSIS — I480 Paroxysmal atrial fibrillation: Secondary | ICD-10-CM | POA: Diagnosis not present

## 2018-07-11 DIAGNOSIS — Z45010 Encounter for checking and testing of cardiac pacemaker pulse generator [battery]: Secondary | ICD-10-CM | POA: Diagnosis not present

## 2018-07-11 DIAGNOSIS — I471 Supraventricular tachycardia: Secondary | ICD-10-CM | POA: Diagnosis not present

## 2018-07-11 DIAGNOSIS — I495 Sick sinus syndrome: Secondary | ICD-10-CM | POA: Diagnosis not present

## 2018-07-12 ENCOUNTER — Ambulatory Visit (INDEPENDENT_AMBULATORY_CARE_PROVIDER_SITE_OTHER): Payer: Medicare HMO | Admitting: Family Medicine

## 2018-07-12 ENCOUNTER — Encounter: Payer: Self-pay | Admitting: Family Medicine

## 2018-07-12 VITALS — BP 136/72 | HR 78 | Ht 61.0 in | Wt 116.0 lb

## 2018-07-12 DIAGNOSIS — R7301 Impaired fasting glucose: Secondary | ICD-10-CM | POA: Diagnosis not present

## 2018-07-12 DIAGNOSIS — I10 Essential (primary) hypertension: Secondary | ICD-10-CM | POA: Diagnosis not present

## 2018-07-12 DIAGNOSIS — I480 Paroxysmal atrial fibrillation: Secondary | ICD-10-CM | POA: Diagnosis not present

## 2018-07-12 DIAGNOSIS — E039 Hypothyroidism, unspecified: Secondary | ICD-10-CM

## 2018-07-12 LAB — POCT GLYCOSYLATED HEMOGLOBIN (HGB A1C): Hemoglobin A1C: 5.8 % — AB (ref 4.0–5.6)

## 2018-07-12 NOTE — Progress Notes (Signed)
Subjective:    CC:   HPI: Impaired fasting glucose-no increased thirst or urination. No symptoms consistent with hypoglycemia.  Hypothyroidism - Taking medication regularly in the AM away from food and vitamins, etc. No recent change to skin, hair, or energy levels.  Hypertension- Pt denies chest pain, SOB, dizziness, or heart palpitations.  Taking meds as directed w/o problems.  Denies medication side effects.  She was asked to cut her carvedilol in half daily.  Is just on a half a tab of the carvedilol.  She was actually having low blood pressures when I saw her last time.  She is been seeing Dr. Estella Huskunheim, at [facility], for her back pain.  She is had 4 injections thus far and says it actually has been really helpful and has given her some pain relief.  Atrial fiibrillation-she did see cardiology and she is opting not to be on a blood thinner so she is just taking an aspirin daily.  The allergist is aware.   Past medical history, Surgical history, Family history not pertinant except as noted below, Social history, Allergies, and medications have been entered into the medical record, reviewed, and corrections made.   Review of Systems: No fevers, chills, night sweats, weight loss, chest pain, or shortness of breath.   Objective:    General: Well Developed, well nourished, and in no acute distress.  Neuro: Alert and oriented x3, extra-ocular muscles intact, sensation grossly intact.  HEENT: Normocephalic, atraumatic  Skin: Warm and dry, no rashes. Cardiac: Regular rate and irregular  rhythm, no murmurs rubs or gallops, no lower extremity edema.  Respiratory: Clear to auscultation bilaterally. Not using accessory muscles, speaking in full sentences.   Impression and Recommendations:    IFG -A1c of 5.8 today which is improved from previous in July it looks fantastic so just continue current regimen.  Hypothyroidism -last TSH looked perfect at 2.12 months ago.  Plan will be to  recheck it again in about 2- 3 months  HTN - Well controlled. Continue current regimen. Follow up in  4- 6months.    Atrial fibrillation-she is wanting to just be on only aspirin therapy and has discussed with cardiology.

## 2018-07-12 NOTE — Patient Instructions (Signed)
Go for you labs in January. Just fast before you go.

## 2018-07-13 ENCOUNTER — Encounter: Payer: Self-pay | Admitting: Family Medicine

## 2018-08-18 ENCOUNTER — Ambulatory Visit (INDEPENDENT_AMBULATORY_CARE_PROVIDER_SITE_OTHER): Payer: Medicare HMO | Admitting: Family Medicine

## 2018-08-18 ENCOUNTER — Encounter: Payer: Self-pay | Admitting: Family Medicine

## 2018-08-18 ENCOUNTER — Ambulatory Visit (INDEPENDENT_AMBULATORY_CARE_PROVIDER_SITE_OTHER): Payer: Medicare HMO

## 2018-08-18 VITALS — BP 150/70 | HR 76 | Ht 61.0 in | Wt 117.0 lb

## 2018-08-18 DIAGNOSIS — S0081XA Abrasion of other part of head, initial encounter: Secondary | ICD-10-CM

## 2018-08-18 DIAGNOSIS — S61412A Laceration without foreign body of left hand, initial encounter: Secondary | ICD-10-CM

## 2018-08-18 DIAGNOSIS — R7301 Impaired fasting glucose: Secondary | ICD-10-CM | POA: Diagnosis not present

## 2018-08-18 DIAGNOSIS — R0781 Pleurodynia: Secondary | ICD-10-CM

## 2018-08-18 DIAGNOSIS — E039 Hypothyroidism, unspecified: Secondary | ICD-10-CM | POA: Diagnosis not present

## 2018-08-18 DIAGNOSIS — S01511A Laceration without foreign body of lip, initial encounter: Secondary | ICD-10-CM | POA: Diagnosis not present

## 2018-08-18 DIAGNOSIS — W1839XA Other fall on same level, initial encounter: Secondary | ICD-10-CM

## 2018-08-18 DIAGNOSIS — I10 Essential (primary) hypertension: Secondary | ICD-10-CM | POA: Diagnosis not present

## 2018-08-18 DIAGNOSIS — S299XXA Unspecified injury of thorax, initial encounter: Secondary | ICD-10-CM | POA: Diagnosis not present

## 2018-08-18 LAB — COMPLETE METABOLIC PANEL WITH GFR
AG Ratio: 1.7 (calc) (ref 1.0–2.5)
ALT: 22 U/L (ref 6–29)
AST: 25 U/L (ref 10–35)
Albumin: 4.1 g/dL (ref 3.6–5.1)
Alkaline phosphatase (APISO): 122 U/L (ref 33–130)
BUN/Creatinine Ratio: 20 (calc) (ref 6–22)
BUN: 18 mg/dL (ref 7–25)
CHLORIDE: 104 mmol/L (ref 98–110)
CO2: 27 mmol/L (ref 20–32)
Calcium: 9.8 mg/dL (ref 8.6–10.4)
Creat: 0.89 mg/dL — ABNORMAL HIGH (ref 0.60–0.88)
GFR, EST AFRICAN AMERICAN: 68 mL/min/{1.73_m2} (ref >=60)
GFR, Est Non African American: 58 mL/min/{1.73_m2} — ABNORMAL LOW (ref >=60)
GLUCOSE: 98 mg/dL (ref 65–99)
Globulin: 2.4 g/dL (calc) (ref 1.9–3.7)
Potassium: 3.7 mmol/L (ref 3.5–5.3)
Sodium: 141 mmol/L (ref 135–146)
Total Bilirubin: 1.7 mg/dL — ABNORMAL HIGH (ref 0.2–1.2)
Total Protein: 6.5 g/dL (ref 6.1–8.1)

## 2018-08-18 LAB — LIPID PANEL
Cholesterol: 134 mg/dL (ref <200)
HDL: 63 mg/dL (ref >50)
LDL CHOLESTEROL (CALC): 55 mg/dL
Non-HDL Cholesterol (Calc): 71 mg/dL (calc) (ref <130)
Total CHOL/HDL Ratio: 2.1 (calc) (ref <5.0)
Triglycerides: 79 mg/dL (ref <150)

## 2018-08-18 LAB — TSH: TSH: 1.94 m[IU]/L (ref 0.40–4.50)

## 2018-08-18 NOTE — Progress Notes (Signed)
Acute Office Visit  Subjective:    Patient ID: Tina Graham, female    DOB: Sep 24, 1928, 85 y.o.   MRN: [id number]  Chief Complaint  Patient presents with  . Fall    pt reports that she was out walking yesterday around 1015 AM and her foot got stuck in a hole she tried to catch herself w/her L hand as she was going down she hit her L side. she has a bruise on her L breast, L wrist and arm, L side pain, bilateral knee bruising lower lip abrasion and nose    HPI Patient is in today for pt reports that she was out walking yesterday around 1015 AM and her foot got stuck in a hole she tried to catch herself w/her L hand as she was going down she hit her L side. she has a bruise on her L breast, L wrist and arm, L side pain, bilateral knee bruising lower lip abrasion and nose. She has pain over the left lateral rib cage that hurts with a deep breath.  Denies feeling lightheaded or dizzy or hitting her head.  Past Medical History:  Diagnosis Date  . H. pylori infection    treated  . HOH (hard of hearing)   . Hyperlipidemia   . Hypertension   . Hypothyroidism     Past Surgical History:  Procedure Laterality Date  . APPENDECTOMY    . CHOLECYSTECTOMY    . THYROIDECTOMY     partial for goiter    Family History  Problem Relation Age of Onset  . Cancer Daughter   . Heart disease Sister        unknown specifics    Social History   Socioeconomic History  . Marital status: Widowed    Spouse name: Not on file  . Number of children: 5  . Years of education: Not on file  . Highest education level: Not on file  Occupational History    Employer: RETIRED  Social Needs  . Financial resource strain: Not on file  . Food insecurity:    Worry: Not on file    Inability: Not on file  . Transportation needs:    Medical: Not on file    Non-medical: Not on file  Tobacco Use  . Smoking status: Former Games developer  . Smokeless tobacco: Never Used  Substance and Sexual Activity  . Alcohol  use: No  . Drug use: No  . Sexual activity: Not on file    Comment: housewife, widowed, lives alone, regular exercise  Lifestyle  . Physical activity:    Days per week: Not on file    Minutes per session: Not on file  . Stress: Not on file  Relationships  . Social connections:    Talks on phone: Not on file    Gets together: Not on file    Attends religious service: Not on file    Active member of club or organization: Not on file    Attends meetings of clubs or organizations: Not on file    Relationship status: Not on file  . Intimate partner violence:    Fear of current or ex partner: Not on file    Emotionally abused: Not on file    Physically abused: Not on file    Forced sexual activity: Not on file  Other Topics Concern  . Not on file  Social History Narrative  . Not on file    Outpatient Medications Prior to Visit  Medication Sig  Dispense Refill  . ALPRAZolam (XANAX) 1 MG tablet TAKE 1 TABLET AT BEDTIME AS NEEDED FOR ANXIETY. 30 tablet 5  . amLODipine (NORVASC) 5 MG tablet TAKE 1 TABLET IN THE MORNING AND TAKE 1 AND 1/2 TABLETS AT NIGHT AS NEEDED. 18 tablet 0  . atorvastatin (LIPITOR) 40 MG tablet Take 1 tablet (40 mg total) by mouth daily. 90 tablet 3  . carvedilol (COREG) 25 MG tablet     . lamoTRIgine (LAMICTAL) 25 MG tablet Take 50 mg by mouth 2 (two) times daily.     Marland Kitchen. levothyroxine (SYNTHROID, LEVOTHROID) 88 MCG tablet TAKE 1 TABLET EVERY DAY 90 tablet 0  . Omega-3 Fatty Acids (FISH OIL) 1000 MG CAPS Take 1,000 mg by mouth daily.      No facility-administered medications prior to visit.     Allergies  Allergen Reactions  . Ibuprofen Other (See Comments)    Pt unable to recall reaction Patient is not sure what happens if she takes this medicine.  . Simvastatin Other (See Comments)    REACTION: myalgias    ROS     Objective:    Physical Exam  Constitutional: She is oriented to person, place, and time. She appears well-developed and well-nourished.   HENT:  Head: Normocephalic and atraumatic.  Some bruising on her chin and a scabbed laceration on her lower lip.  Inside the lower lip she also has an approximately 3 cm laceration where her teeth cut the lip.  She has an abrasion on her nose.  She has a bruise on her left breast but she says that was not from the fall that is where she pinched it with the scissors.  He also has 4 abrasions on her left hand and left thumb.  She has significant bruising over her left forearm.  Cardiovascular: Normal rate, regular rhythm and normal heart sounds.  Pulmonary/Chest: Effort normal and breath sounds normal.  Tender over the left lateral rib cage just below the bra area.  No bruising over the surface of the skin.  Musculoskeletal:     Comments: Left wrist is very bruised with mild swelling but she is able to move it with all range of motion and move her hands with normal range of motion.  Neurological: She is alert and oriented to person, place, and time.  Skin: Skin is warm and dry.  Psychiatric: She has a normal mood and affect. Her behavior is normal.    BP (!) 150/70   Pulse 76   Ht 5\' 1"  (1.549 m)   Wt 117 lb (53.1 kg)   SpO2 98%   BMI 22.11 kg/m  Wt Readings from Last 3 Encounters:  08/18/18 117 lb (53.1 kg)  07/12/18 116 lb (52.6 kg)  03/10/18 115 lb (52.2 kg)    Health Maintenance Due  Topic Date Due  . MAMMOGRAM  02/07/2015    There are no preventive care reminders to display for this patient.   Lab Results  Component Value Date   TSH 2.16 05/05/2018   Lab Results  Component Value Date   WBC 6.9 06/13/2012   HGB 13.2 06/13/2012   HCT 38.3 06/13/2012   MCV 88.7 06/13/2012   PLT 334 06/13/2012   Lab Results  Component Value Date   NA 140 03/10/2018   K 4.4 03/10/2018   CO2 29 03/10/2018   GLUCOSE 105 (H) 03/10/2018   BUN 19 03/10/2018   CREATININE 0.95 (H) 03/10/2018   BILITOT 0.9 08/09/2017   ALKPHOS 98 11/05/2016  AST 22 08/09/2017   ALT 19 08/09/2017    PROT 7.3 08/09/2017   ALBUMIN 4.0 11/05/2016   CALCIUM 9.4 03/10/2018   Lab Results  Component Value Date   CHOL 265 (H) 08/09/2017   Lab Results  Component Value Date   HDL 79 08/09/2017   Lab Results  Component Value Date   LDLCALC 161 (H) 08/09/2017   Lab Results  Component Value Date   TRIG 125 08/09/2017   Lab Results  Component Value Date   CHOLHDL 3.4 08/09/2017   Lab Results  Component Value Date   HGBA1C 5.8 (A) 07/12/2018       Assessment & Plan:   Problem List Items Addressed This Visit    None    Visit Diagnoses    Rib pain on left side    -  Primary   Relevant Orders   DG Ribs Unilateral W/Chest Left   Abrasion, face w/o infection       Laceration of lower lip, initial encounter       Laceration of left hand without foreign body, initial encounter       Fall on same level due to nature of surface, initial encounter          Dermabond used on the cuts on her hand.  She had 3 of them on her left hand and one on her right hand.  Recommend cool compresses.  And recommend Vaseline to the abrasions after her shower to keep them moisturized and to help improve wound healing.  She does have some left-sided rib pain so we will get x-rays today to rule out for fracture.  No orders of the defined types were placed in this encounter.    Nani Gasseratherine Metheney, MD

## 2018-08-18 NOTE — Patient Instructions (Signed)
Ice the areas.  Apply gentle pressure or ACE bandage for swelling

## 2018-08-19 ENCOUNTER — Other Ambulatory Visit: Payer: Self-pay | Admitting: *Deleted

## 2018-08-19 DIAGNOSIS — R17 Unspecified jaundice: Secondary | ICD-10-CM

## 2018-09-12 ENCOUNTER — Other Ambulatory Visit: Payer: Self-pay | Admitting: Family Medicine

## 2018-09-29 DIAGNOSIS — R42 Dizziness and giddiness: Secondary | ICD-10-CM | POA: Diagnosis not present

## 2018-09-29 DIAGNOSIS — G5623 Lesion of ulnar nerve, bilateral upper limbs: Secondary | ICD-10-CM | POA: Diagnosis not present

## 2018-09-29 DIAGNOSIS — M5412 Radiculopathy, cervical region: Secondary | ICD-10-CM | POA: Diagnosis not present

## 2018-09-29 DIAGNOSIS — G5603 Carpal tunnel syndrome, bilateral upper limbs: Secondary | ICD-10-CM | POA: Diagnosis not present

## 2018-09-29 DIAGNOSIS — R55 Syncope and collapse: Secondary | ICD-10-CM | POA: Diagnosis not present

## 2018-09-29 DIAGNOSIS — M5417 Radiculopathy, lumbosacral region: Secondary | ICD-10-CM | POA: Diagnosis not present

## 2018-09-29 DIAGNOSIS — G3184 Mild cognitive impairment, so stated: Secondary | ICD-10-CM | POA: Diagnosis not present

## 2018-10-03 ENCOUNTER — Ambulatory Visit (INDEPENDENT_AMBULATORY_CARE_PROVIDER_SITE_OTHER): Payer: Medicare HMO

## 2018-10-03 ENCOUNTER — Ambulatory Visit (INDEPENDENT_AMBULATORY_CARE_PROVIDER_SITE_OTHER): Payer: Medicare HMO | Admitting: Family Medicine

## 2018-10-03 ENCOUNTER — Encounter: Payer: Self-pay | Admitting: Family Medicine

## 2018-10-03 VITALS — BP 162/73 | HR 85 | Ht 61.0 in | Wt 117.0 lb

## 2018-10-03 DIAGNOSIS — M19032 Primary osteoarthritis, left wrist: Secondary | ICD-10-CM | POA: Diagnosis not present

## 2018-10-03 DIAGNOSIS — M25532 Pain in left wrist: Secondary | ICD-10-CM | POA: Diagnosis not present

## 2018-10-03 DIAGNOSIS — R17 Unspecified jaundice: Secondary | ICD-10-CM

## 2018-10-03 DIAGNOSIS — M25512 Pain in left shoulder: Secondary | ICD-10-CM | POA: Diagnosis not present

## 2018-10-03 DIAGNOSIS — M25511 Pain in right shoulder: Secondary | ICD-10-CM | POA: Diagnosis not present

## 2018-10-03 DIAGNOSIS — M25629 Stiffness of unspecified elbow, not elsewhere classified: Secondary | ICD-10-CM

## 2018-10-03 NOTE — Progress Notes (Signed)
Subjective:    CC: shoulder pain   HPI:  85 year old female is here today for follow-up from recent fall in January.  She fell on January 15 around 10:15 AM.  She got her foot stuck in a hole and tried to catch herself with her left hand.  She says since then she still continued to have some pain in both shoulders.  They are quite stiff and is difficult to reach up.  She is also continued to have some persistent pain in her right thigh.  He also had a significant abrasion to her face but that seems to have healed well.  Her hands have healed well she had cuts that required Dermabond.  She still has some pain in her left wrist and says it is painful to write.    She says she has not fallen since I last saw her but says she was on a pillow on her knees and tumbled forwards.  Initially she had a hard time getting up off the floor but was eventually able to do so after about 20 or 30 minutes.  Also brought in a form from an insurance company o be to be completed for her injury from January.  Recently did blood work and she had an elevated bilirubin level.  I did ask her to have that repeated in a few weeks.  Past medical history, Surgical history, Family history not pertinant except as noted below, Social history, Allergies, and medications have been entered into the medical record, reviewed, and corrections made.   Review of Systems: No fevers, chills, night sweats, weight loss, chest pain, or shortness of breath.   Objective:    General: Well Developed, well nourished, and in no acute distress.  Neuro: Alert and oriented x3, extra-ocular muscles intact, sensation grossly intact.  HEENT: Normocephalic, atraumatic  Skin: Warm and dry, no rashes. Cardiac: Regular rate and rhythm, no murmurs rubs or gallops, no lower extremity edema.  Respiratory: Clear to auscultation bilaterally. Not using accessory muscles, speaking in full sentences. MSK: She is just able to extend both shoulders to about 90  degrees and not much past that and even that was quite difficult.  She has severely limited external rotation with both shoulders as well.  Elbows and wrists with normal range of motion.  She is nontender over the left wrist joint but it does pop and crack very easily with palpation.  Impression and Recommendations:   Bilateral shoulder pain and stiffness-certainly could be related to the fall at [place] seems like she is getting frozen shoulder bilaterally but because it is bilateral I would like to check a CK and inflammatory marker level just to make sure there is nothing else going on. She has an orthopaedist. I think she would benefit from PT evaluation as well.   Left wrist pain-she still having some discomfort with writing and would like to get an x-ray for further work-up.  It is possible that she had a fracture originally and that is why it has been healing so slowly she did fall on the outstretched hand and in fact had several lacerations when I saw her in January.  Laceration - well healed.    Weighted bilirubin-due to recheck levels.

## 2018-10-04 ENCOUNTER — Telehealth: Payer: Self-pay | Admitting: Family Medicine

## 2018-10-04 LAB — CBC
HCT: 42.6 % (ref 35.0–45.0)
HEMOGLOBIN: 14.4 g/dL (ref 11.7–15.5)
MCH: 30.7 pg (ref 27.0–33.0)
MCHC: 33.8 g/dL (ref 32.0–36.0)
MCV: 90.8 fL (ref 80.0–100.0)
MPV: 9.7 fL (ref 7.5–12.5)
Platelets: 370 10*3/uL (ref 140–400)
RBC: 4.69 10*6/uL (ref 3.80–5.10)
RDW: 11.9 % (ref 11.0–15.0)
WBC: 10.5 10*3/uL (ref 3.8–10.8)

## 2018-10-04 LAB — CK: Total CK: 40 U/L (ref 29–143)

## 2018-10-04 LAB — BILIRUBIN, FRACTIONATED(TOT/DIR/INDIR)
Bilirubin, Direct: 0.2 mg/dL (ref 0.0–0.2)
Indirect Bilirubin: 0.7 mg/dL (calc) (ref 0.2–1.2)
Total Bilirubin: 0.9 mg/dL (ref 0.2–1.2)

## 2018-10-04 LAB — SEDIMENTATION RATE: Sed Rate: 2 mm/h (ref 0–30)

## 2018-10-04 NOTE — Telephone Encounter (Signed)
Form for combined insurance company of [place] completed and pt advised. Per her request, she would like copy mailed to her. Form placed in mail today, copy to be scanned into chart.

## 2018-10-15 DIAGNOSIS — Z45010 Encounter for checking and testing of cardiac pacemaker pulse generator [battery]: Secondary | ICD-10-CM | POA: Diagnosis not present

## 2018-10-15 DIAGNOSIS — Z45018 Encounter for adjustment and management of other part of cardiac pacemaker: Secondary | ICD-10-CM | POA: Diagnosis not present

## 2018-11-14 ENCOUNTER — Encounter: Payer: Self-pay | Admitting: Family Medicine

## 2018-11-14 ENCOUNTER — Ambulatory Visit (INDEPENDENT_AMBULATORY_CARE_PROVIDER_SITE_OTHER): Payer: Medicare HMO | Admitting: Family Medicine

## 2018-11-14 VITALS — BP 126/72 | HR 69 | Temp 98.4°F | Ht 61.0 in | Wt 118.5 lb

## 2018-11-14 DIAGNOSIS — E039 Hypothyroidism, unspecified: Secondary | ICD-10-CM

## 2018-11-14 DIAGNOSIS — I10 Essential (primary) hypertension: Secondary | ICD-10-CM | POA: Diagnosis not present

## 2018-11-14 DIAGNOSIS — F411 Generalized anxiety disorder: Secondary | ICD-10-CM | POA: Diagnosis not present

## 2018-11-14 DIAGNOSIS — R600 Localized edema: Secondary | ICD-10-CM

## 2018-11-14 MED ORDER — AMLODIPINE BESYLATE 5 MG PO TABS: 5.0000 mg | ORAL_TABLET | Freq: Every day | ORAL | 1 refills | Status: DC

## 2018-11-14 MED ORDER — LEVOTHYROXINE SODIUM 88 MCG PO TABS: 88.0000 ug | ORAL_TABLET | Freq: Every day | ORAL | 1 refills | Status: DC

## 2018-11-14 NOTE — Progress Notes (Signed)
Virtual Visit via Telephone Note  I connected with Tina Graham on 11/14/18 at 10:50 AM EDT by telephone and verified that I am speaking with the correct person using two identifiers.   I discussed the limitations, risks, security and privacy concerns of performing an evaluation and management service by telephone and the availability of in person appointments. I also discussed with the patient that there may be a patient responsible charge related to this service. The patient expressed understanding and agreed to proceed.   Subjective:    CC: 4 month f/u   HPI: Subjective:    CC: HTN    HPI:  Hypertension- Pt denies chest pain, SOB, dizziness, or heart palpitations.  Taking meds as directed w/o problems.  Denies medication side effects.  She has been having some higher readings in the evenings around 140-150s.  occ her legs have been swelling.  Has been soaking them epsom salt.  Gets better at night. Not painful.  Has been coming and going for the last couple of years.    Hypothyroidism - Taking medication regularly in the AM away from food and vitamins, etc. No recent change to skin, hair, or energy levels.  F/U GAD - she uses her xanax nightly to help her anxiety and for sleep.   Past medical history, Surgical history, Family history not pertinant except as noted below, Social history, Allergies, and medications have been entered into the medical record, reviewed, and corrections made.   Review of Systems: No fevers, chills, night sweats, weight loss, chest pain, or shortness of breath.   Objective:    General: Speaking clearly in complete sentences without any shortness of breath.  Alert and oriented x3.  Normal judgment. No apparent acute distress.    Impression and Recommendations:   HTN - Well controlled. Continue current regimen. Follow up in  4 months.    Hypothyroidism - she is asymptomatic. No recent skin or hair changes. No changes in energy levels.  Last tsh was  great 1.9 in January.   GAD - she is doing well stable.  Staying at home. Says her son calls her everyday to check on her.   LE swelling - likely venous stasis.  Elevate for 10-15 min a couple of times a day. Call if getting worse or not improving after elevating them overnight.    Past medical history, Surgical history, Family history not pertinant except as noted below, Social history, Allergies, and medications have been entered into the medical record, reviewed, and corrections made.   Review of Systems: No fevers, chills, night sweats, weight loss, chest pain, or shortness of breath.   Objective:    General: Speaking clearly in complete sentences without any shortness of breath.  Alert and oriented x3.  Normal judgment. No apparent acute distress.    Impression and Recommendations:    HTN - Well controlled. Call if BP > 160s.   Continue current regimen. Follow up in  6 months.    Hypothyroidism - doing well. no new sxs. Plans to recheck levels this summer.   GAD - continue with regimen. She splits her xanax and has plenty of refills.      I discussed the assessment and treatment plan with the patient. The patient was provided an opportunity to ask questions and all were answered. The patient agreed with the plan and demonstrated an understanding of the instructions.   The patient was advised to call back or seek an in-person evaluation if the symptoms worsen or  if the condition fails to improve as anticipated.  I provided 22 minutes of non-face-to-face time during this encounter.   Nani Gasseratherine , MD

## 2018-12-14 ENCOUNTER — Other Ambulatory Visit: Payer: Self-pay | Admitting: Family Medicine

## 2018-12-14 DIAGNOSIS — E785 Hyperlipidemia, unspecified: Secondary | ICD-10-CM

## 2018-12-27 ENCOUNTER — Other Ambulatory Visit: Payer: Self-pay | Admitting: *Deleted

## 2018-12-27 MED ORDER — ALPRAZOLAM 1 MG PO TABS: ORAL_TABLET | ORAL | 5 refills | Status: DC

## 2019-01-05 DIAGNOSIS — I639 Cerebral infarction, unspecified: Secondary | ICD-10-CM | POA: Diagnosis not present

## 2019-01-05 DIAGNOSIS — G3184 Mild cognitive impairment, so stated: Secondary | ICD-10-CM | POA: Diagnosis not present

## 2019-01-05 DIAGNOSIS — G40209 Localization-related (focal) (partial) symptomatic epilepsy and epileptic syndromes with complex partial seizures, not intractable, without status epilepticus: Secondary | ICD-10-CM | POA: Diagnosis not present

## 2019-01-05 DIAGNOSIS — R42 Dizziness and giddiness: Secondary | ICD-10-CM | POA: Diagnosis not present

## 2019-01-05 DIAGNOSIS — M545 Low back pain: Secondary | ICD-10-CM | POA: Diagnosis not present

## 2019-01-11 DIAGNOSIS — I495 Sick sinus syndrome: Secondary | ICD-10-CM | POA: Diagnosis not present

## 2019-01-11 DIAGNOSIS — I4891 Unspecified atrial fibrillation: Secondary | ICD-10-CM | POA: Diagnosis not present

## 2019-01-11 DIAGNOSIS — I4892 Unspecified atrial flutter: Secondary | ICD-10-CM | POA: Diagnosis not present

## 2019-01-11 DIAGNOSIS — I480 Paroxysmal atrial fibrillation: Secondary | ICD-10-CM | POA: Diagnosis not present

## 2019-03-15 NOTE — Progress Notes (Signed)
Subjective:   Tina Graham is a 85 y.o. female who presents for an Initial Medicare Annual Wellness Visit.  Review of Systems    No ROS.  Medicare Wellness Virtual Visit.  Visual/audio telehealth visit, UTA vital signs.   See social history for additional risk factors.     Cardiac Risk Factors include: advanced age (>10255men, 77>65 women);hypertension;sedentary lifestyle;dyslipidemia Sleep patterns: Getting 6 hours of sleep a night. Wakes up 2 times to void. Wakes up and feels refreshed   Home Safety/Smoke Alarms: Feels safe in home. Smoke alarms in place.  Living environment; Lives alone in a 2 story home. Stairs have handrails on them. Shower is a step over tub/shower combo, no grab bars are in the shower. Seat Belt Safety/Bike Helmet: Wears seat belt.   Female:   Pap-  Aged out     Mammo- Aged out       Dexa scan- declined by patient       CCS- Aged out      Objective:    Today's Vitals   03/20/19 1023  BP: 117/66  Pulse: 66  Weight: 118 lb (53.5 kg)  Height: 5' (1.524 m)   Body mass index is 23.05 kg/m.  Advanced Directives 03/20/2019  Does Patient Have a Medical Advance Directive? Yes  Type of Estate agentAdvance Directive Healthcare Power of [place];Living will  Does patient want to make changes to medical advance directive? No - Patient declined  Copy of Healthcare Power of Attorney in Chart? No - copy requested    Current Medications (verified) Outpatient Encounter Medications as of 03/20/2019  Medication Sig  . ALPRAZolam (XANAX) 1 MG tablet TAKE 1 TABLET AT BEDTIME AS NEEDED FOR ANXIETY.  Marland Kitchen. amLODipine (NORVASC) 5 MG tablet Take 1 tablet (5 mg total) by mouth daily.  Marland Kitchen. aspirin EC 81 MG tablet Take 81 mg by mouth daily.  Marland Kitchen. atorvastatin (LIPITOR) 40 MG tablet TAKE 1 TABLET EVERY DAY  . carvedilol (COREG) 25 MG tablet   . lamoTRIgine (LAMICTAL) 25 MG tablet Take 50 mg by mouth 2 (two) times daily.   Marland Kitchen. levothyroxine (SYNTHROID, LEVOTHROID) 88 MCG tablet Take 1 tablet  (88 mcg total) by mouth daily.  . Omega-3 Fatty Acids (FISH OIL) 1000 MG CAPS Take 1,000 mg by mouth daily.    No facility-administered encounter medications on file as of 03/20/2019.     Allergies (verified) Ibuprofen and Simvastatin   History: Past Medical History:  Diagnosis Date  . H. pylori infection    treated  . HOH (hard of hearing)   . Hyperlipidemia   . Hypertension   . Hypothyroidism    Past Surgical History:  Procedure Laterality Date  . APPENDECTOMY    . CHOLECYSTECTOMY    . THYROIDECTOMY     partial for goiter   Family History  Problem Relation Age of Onset  . Cancer Daughter   . Heart disease Sister        unknown specifics   Social History   Socioeconomic History  . Marital status: Widowed    Spouse name: Not on file  . Number of children: 5  . Years of education: 6  . Highest education level: GED or equivalent  Occupational History    Employer: RETIRED  Social Needs  . Financial resource strain: Not hard at all  . Food insecurity    Worry: Never true    Inability: Never true  . Transportation needs    Medical: No    Non-medical: No  Tobacco Use  . Smoking status: Former Research scientist (life sciences)  . Smokeless tobacco: Never Used  Substance and Sexual Activity  . Alcohol use: No  . Drug use: No  . Sexual activity: Not Currently    Comment: housewife, widowed, lives alone, regular exercise  Lifestyle  . Physical activity    Days per week: 0 days    Minutes per session: 0 min  . Stress: Not at all  Relationships  . Social connections    Talks on phone: More than three times a week    Gets together: Once a week    Attends religious service: 1 to 4 times per year    Active member of club or organization: No    Attends meetings of clubs or organizations: Never    Relationship status: Widowed  Other Topics Concern  . Not on file  Social History Narrative   Has back problems and sleeps a lot due to this- back pain    Tobacco Counseling Counseling  given: No   Clinical Intake:                        Activities of Daily Living In your present state of health, do you have any difficulty performing the following activities: 03/20/2019  Hearing? Y  Comment hearing aids in both ears  Vision? N  Difficulty concentrating or making decisions? N  Walking or climbing stairs? N  Dressing or bathing? N  Doing errands, shopping? N  Preparing Food and eating ? N  Using the Toilet? N  In the past six months, have you accidently leaked urine? Y  Comment wears a pad  Do you have problems with loss of bowel control? N  Managing your Medications? N  Managing your Finances? N  Housekeeping or managing your Housekeeping? N  Some recent data might be hidden     Immunizations and Health Maintenance Immunization History  Administered Date(s) Administered  . Influenza Split 04/22/2011  . Influenza Whole 04/30/2008, 07/08/2009, 07/08/2010  . Influenza, High Dose Seasonal PF 06/15/2016, 06/30/2017, 05/27/2018  . Influenza,inj,Quad PF,6+ Mos 06/08/2013, 05/06/2015  . Influenza-Unspecified 06/08/2014  . Pneumococcal Conjugate-13 02/04/2017  . Pneumococcal Polysaccharide-23 08/03/2005  . Td 04/17/2005  . Tdap 06/28/2016   Health Maintenance Due  Topic Date Due  . INFLUENZA VACCINE  03/04/2019    Patient Care Team: Hali Marry, MD as PCP - General (Family Medicine) Boyd Kerbs, MD as Referring Physician (Rheumatology)  Indicate any recent Medical Services you may have received from other than [facility] providers in the past year (date may be approximate).     Assessment:   This is a routine wellness examination for [place].Physical assessment deferred to PCP.   Hearing/Vision screen  Hearing Screening   125Hz  250Hz  500Hz  1000Hz  2000Hz  3000Hz  4000Hz  6000Hz  8000Hz   Right ear:           Left ear:           Comments: Hearing test not done, visit was done via telephone due to [place] pandemic  Vision Screening  Comments: Vision test not done, visit done via telephone due to the [place] pandemic.  Dietary issues and exercise activities discussed: Current Exercise Habits: The patient does not participate in regular exercise at present Diet Eats a healthy diet of fruits and vegetables. Breakfast:sausage and eggs and tomato Lunch: leftover or a sandwich Dinner: Vegetables and a meat occasionally. Drinks water daily.I caffeine soda a day      Goals    .  Exercise 3x per week (30 min per time)     Would like to start back walking again. Walk at least 30 minutes a day 3 times a week .      Depression Screen PHQ 2/9 Scores 03/20/2019 11/14/2018 08/09/2017 02/11/2016 01/02/2015 10/16/2013  PHQ - 2 Score 0 1 1 0 0 0    Fall Risk Fall Risk  03/20/2019 08/18/2018 08/09/2017 08/09/2017 02/11/2016  Falls in the past year? 1 1 No No Yes  Comment - - - - -  Number falls in past yr: 1 0 - - 2 or more  Injury with Fall? 0 1 - - Yes  Risk for fall due to : Impaired balance/gait History of fall(s) - - Impaired balance/gait  Risk for fall due to: Comment - - - - -  Follow up Falls prevention discussed Falls prevention discussed - - Falls prevention discussed    Is the patient's home free of loose throw rugs in walkways, pet beds, electrical cords, etc?   yes      Grab bars in the bathroom? no      Handrails on the stairs?   yes      Adequate lighting?   yes   Cognitive Function: MMSE - Mini Mental State Exam 08/09/2017 02/04/2017  Orientation to time 5 5  Orientation to Place 4 5  Registration 3 3  Attention/ Calculation 5 5  Recall 3 2  Language- name 2 objects 2 2  Language- repeat 0 1  Language- follow 3 step command 3 3  Language- read & follow direction 1 1  Write a sentence 1 1  Copy design 0 0  Total score 27 28     6CIT Screen 03/20/2019  What Year? 0 points  What month? 0 points  What time? 0 points  Count back from 20 0 points  Months in reverse 0 points    Screening Tests Health Maintenance   Topic Date Due  . INFLUENZA VACCINE  03/04/2019  . MAMMOGRAM  10/03/2023 (Originally 02/07/2015)  . TETANUS/TDAP  06/28/2026  . DEXA SCAN  Completed  . PNA vac Low Risk Adult  Completed       Plan:    Ms. Tina Graham , Thank you for taking time to come for your Medicare Wellness Visit. I appreciate your ongoing commitment to your health goals. Please review the following plan we discussed and let me know if I can assist you in the future.  Please schedule your next medicare wellness visit with me in 1 yr. Continue doing brain stimulating activities (puzzles, reading, adult coloring books, staying active) to keep memory sharp.   These are the goals we discussed: Goals    . Exercise 3x per week (30 min per time)     Would like to start back walking again. Walk at least 30 minutes a day 3 times a week .       This is a list of the screening recommended for you and due dates:  Health Maintenance  Topic Date Due  . Flu Shot  03/04/2019  . Mammogram  10/03/2023*  . Tetanus Vaccine  06/28/2026  . DEXA scan (bone density measurement)  Completed  . Pneumonia vaccines  Completed  *Topic was postponed. The date shown is not the original due date.     I have personally reviewed and noted the following in the patient's chart:   . Medical and social history . Use of alcohol, tobacco or illicit drugs  . Current  medications and supplements . Functional ability and status . Nutritional status . Physical activity . Advanced directives . List of other physicians . Hospitalizations, surgeries, and ER visits in previous 12 months . Vitals . Screenings to include cognitive, depression, and falls . Referrals and appointments  In addition, I have reviewed and discussed with patient certain preventive protocols, quality metrics, and best practice recommendations. A written personalized care plan for preventive services as well as general preventive health recommendations were provided to  patient.     Normand SloopKimberly A , LPN   [id number]/82/[id number]/17/2020

## 2019-03-20 ENCOUNTER — Ambulatory Visit (INDEPENDENT_AMBULATORY_CARE_PROVIDER_SITE_OTHER): Payer: Medicare HMO | Admitting: *Deleted

## 2019-03-20 VITALS — BP 117/66 | HR 66 | Temp 97.9°F | Ht 60.0 in | Wt 118.0 lb

## 2019-03-20 DIAGNOSIS — Z0000 Encounter for general adult medical examination without abnormal findings: Secondary | ICD-10-CM | POA: Diagnosis not present

## 2019-03-20 NOTE — Patient Instructions (Addendum)
Ms. Becht , Thank you for taking time to come for your Medicare Wellness Visit. I appreciate your ongoing commitment to your health goals. Please review the following plan we discussed and let me know if I can assist you in the future.  Please schedule your next medicare wellness visit with me in 1 yr. Continue doing brain stimulating activities (puzzles, reading, adult coloring books, staying active) to keep memory sharp.   These are the goals we discussed: Goals    . Exercise 3x per week (30 min per time)     Would like to start back walking again. Walk at least 30 minutes a day 3 times a week .

## 2019-04-12 ENCOUNTER — Telehealth: Payer: Self-pay

## 2019-04-12 NOTE — Telephone Encounter (Signed)
Tina Graham called and states she use to see Dr Janyce Llanos ,Cardiology, and he prescribed Carvedilol 25 mg once daily. She states she was advised to cut the dose in half and take 12.5 mg daily. She states she had the 25 mg and still has 6 days left. She needs a refill. I advised her to call her new Cardiologist. She states she has a call into them but would like to have her medications with Dr Madilyn Fireman. I advised her Dr Madilyn Fireman is out until Monday.

## 2019-04-12 NOTE — Telephone Encounter (Signed)
Carvedilol is usually a twice a day drug. Will you call pharmacy and confirm that is how it was written once a day. Ok to give a 30 day supply and then follow up with PCP.

## 2019-04-13 DIAGNOSIS — G40209 Localization-related (focal) (partial) symptomatic epilepsy and epileptic syndromes with complex partial seizures, not intractable, without status epilepticus: Secondary | ICD-10-CM | POA: Diagnosis not present

## 2019-04-13 DIAGNOSIS — R42 Dizziness and giddiness: Secondary | ICD-10-CM | POA: Diagnosis not present

## 2019-04-13 DIAGNOSIS — M545 Low back pain: Secondary | ICD-10-CM | POA: Diagnosis not present

## 2019-04-13 DIAGNOSIS — G3184 Mild cognitive impairment, so stated: Secondary | ICD-10-CM | POA: Diagnosis not present

## 2019-04-13 NOTE — Telephone Encounter (Signed)
I called the pharmacy and the prescription has already been filled by another provider.

## 2019-04-17 DIAGNOSIS — I4891 Unspecified atrial fibrillation: Secondary | ICD-10-CM | POA: Diagnosis not present

## 2019-04-17 DIAGNOSIS — I4892 Unspecified atrial flutter: Secondary | ICD-10-CM | POA: Diagnosis not present

## 2019-04-24 ENCOUNTER — Other Ambulatory Visit: Payer: Self-pay | Admitting: Family Medicine

## 2019-06-01 ENCOUNTER — Encounter: Payer: Self-pay | Admitting: Family Medicine

## 2019-06-01 DIAGNOSIS — H43813 Vitreous degeneration, bilateral: Secondary | ICD-10-CM | POA: Diagnosis not present

## 2019-06-01 DIAGNOSIS — H527 Unspecified disorder of refraction: Secondary | ICD-10-CM | POA: Diagnosis not present

## 2019-06-01 DIAGNOSIS — Z961 Presence of intraocular lens: Secondary | ICD-10-CM | POA: Diagnosis not present

## 2019-06-01 DIAGNOSIS — H02834 Dermatochalasis of left upper eyelid: Secondary | ICD-10-CM | POA: Diagnosis not present

## 2019-06-01 DIAGNOSIS — H26493 Other secondary cataract, bilateral: Secondary | ICD-10-CM | POA: Diagnosis not present

## 2019-06-01 DIAGNOSIS — H353132 Nonexudative age-related macular degeneration, bilateral, intermediate dry stage: Secondary | ICD-10-CM | POA: Diagnosis not present

## 2019-06-01 DIAGNOSIS — H02831 Dermatochalasis of right upper eyelid: Secondary | ICD-10-CM | POA: Diagnosis not present

## 2019-06-01 DIAGNOSIS — H18513 Endothelial corneal dystrophy, bilateral: Secondary | ICD-10-CM | POA: Diagnosis not present

## 2019-06-08 ENCOUNTER — Encounter: Payer: Self-pay | Admitting: Family Medicine

## 2019-06-08 DIAGNOSIS — H52209 Unspecified astigmatism, unspecified eye: Secondary | ICD-10-CM | POA: Diagnosis not present

## 2019-06-08 DIAGNOSIS — H5213 Myopia, bilateral: Secondary | ICD-10-CM | POA: Diagnosis not present

## 2019-06-08 DIAGNOSIS — H524 Presbyopia: Secondary | ICD-10-CM | POA: Diagnosis not present

## 2019-07-06 DIAGNOSIS — G3184 Mild cognitive impairment, so stated: Secondary | ICD-10-CM | POA: Diagnosis not present

## 2019-07-06 DIAGNOSIS — R42 Dizziness and giddiness: Secondary | ICD-10-CM | POA: Diagnosis not present

## 2019-07-06 DIAGNOSIS — G40209 Localization-related (focal) (partial) symptomatic epilepsy and epileptic syndromes with complex partial seizures, not intractable, without status epilepticus: Secondary | ICD-10-CM | POA: Diagnosis not present

## 2019-07-06 DIAGNOSIS — M545 Low back pain: Secondary | ICD-10-CM | POA: Diagnosis not present

## 2019-07-06 DIAGNOSIS — M5412 Radiculopathy, cervical region: Secondary | ICD-10-CM | POA: Diagnosis not present

## 2019-07-12 DIAGNOSIS — Z45010 Encounter for checking and testing of cardiac pacemaker pulse generator [battery]: Secondary | ICD-10-CM | POA: Diagnosis not present

## 2019-07-12 DIAGNOSIS — I455 Other specified heart block: Secondary | ICD-10-CM | POA: Diagnosis not present

## 2019-07-12 DIAGNOSIS — I480 Paroxysmal atrial fibrillation: Secondary | ICD-10-CM | POA: Diagnosis not present

## 2019-07-12 DIAGNOSIS — I10 Essential (primary) hypertension: Secondary | ICD-10-CM | POA: Diagnosis not present

## 2019-07-12 DIAGNOSIS — I495 Sick sinus syndrome: Secondary | ICD-10-CM | POA: Diagnosis not present

## 2019-07-12 DIAGNOSIS — Z950 Presence of cardiac pacemaker: Secondary | ICD-10-CM | POA: Diagnosis not present

## 2019-07-12 DIAGNOSIS — I4891 Unspecified atrial fibrillation: Secondary | ICD-10-CM | POA: Diagnosis not present

## 2019-08-31 ENCOUNTER — Other Ambulatory Visit: Payer: Self-pay | Admitting: Family Medicine

## 2019-09-18 ENCOUNTER — Telehealth: Payer: Self-pay | Admitting: *Deleted

## 2019-09-19 MED ORDER — ALPRAZOLAM 1 MG PO TABS: ORAL_TABLET | ORAL | 0 refills | Status: DC

## 2019-09-19 NOTE — Telephone Encounter (Signed)
Needs appt. Short supply sent to local pharmacy.

## 2019-09-20 NOTE — Telephone Encounter (Signed)
Patient will call back when shes ready to make appointment.

## 2019-10-02 ENCOUNTER — Encounter: Payer: Self-pay | Admitting: Medical-Surgical

## 2019-10-02 ENCOUNTER — Ambulatory Visit (INDEPENDENT_AMBULATORY_CARE_PROVIDER_SITE_OTHER): Payer: Medicare HMO | Admitting: Medical-Surgical

## 2019-10-02 ENCOUNTER — Other Ambulatory Visit: Payer: Self-pay

## 2019-10-02 VITALS — BP 131/75 | HR 74 | Temp 98.3°F | Ht 60.0 in | Wt 121.1 lb

## 2019-10-02 DIAGNOSIS — R7301 Impaired fasting glucose: Secondary | ICD-10-CM

## 2019-10-02 DIAGNOSIS — F411 Generalized anxiety disorder: Secondary | ICD-10-CM | POA: Diagnosis not present

## 2019-10-02 DIAGNOSIS — M7021 Olecranon bursitis, right elbow: Secondary | ICD-10-CM

## 2019-10-02 DIAGNOSIS — N183 Chronic kidney disease, stage 3 unspecified: Secondary | ICD-10-CM | POA: Diagnosis not present

## 2019-10-02 DIAGNOSIS — E039 Hypothyroidism, unspecified: Secondary | ICD-10-CM

## 2019-10-02 DIAGNOSIS — I10 Essential (primary) hypertension: Secondary | ICD-10-CM

## 2019-10-02 DIAGNOSIS — E785 Hyperlipidemia, unspecified: Secondary | ICD-10-CM

## 2019-10-02 LAB — POCT GLYCOSYLATED HEMOGLOBIN (HGB A1C): Hemoglobin A1C: 5.2 % (ref 4.0–5.6)

## 2019-10-02 MED ORDER — ALPRAZOLAM 1 MG PO TABS: ORAL_TABLET | ORAL | 2 refills | Status: DC

## 2019-10-02 MED ORDER — DOXYCYCLINE HYCLATE 100 MG PO TABS: 100.0000 mg | ORAL_TABLET | Freq: Two times a day (BID) | ORAL | 0 refills | Status: AC

## 2019-10-02 NOTE — Assessment & Plan Note (Signed)
Checking TSH today.  Continue Synthroid as prescribed.

## 2019-10-02 NOTE — Assessment & Plan Note (Addendum)
Stable.  Continue Coreg and amlodipine as prescribed.  No changes in regimen today.  Checking CBC, CMP, and lipids today.

## 2019-10-02 NOTE — Progress Notes (Signed)
Subjective:    CC: Tina Graham, chronic disease follow-up  HPI: Pleasant 86 year old female presenting today for chronic disease follow-up and for evaluation of a Tina on her Graham.  Tina Graham-redness and swelling of the right elbow starting 3 days ago.  Denies pain.  Unknown cause, does not remember any trauma.  Area is warm to touch.  Has been using peroxide and triple antibiotic ointment.  Took one half of a Tylenol, no relief.  No fever, chills.  HTN-does not check blood pressure at home.  Taking medications as prescribed.  Denies chest pain, dizziness, lower extremity swelling.  Occasional shortness of breath with exertion.  Eating a low-sodium diet.  Hyperlipidemia-avoiding high-fat foods.  Taking Lipitor 40 mg daily.  Impaired fasting glucose- avoiding concentrated sweets.  GAD-takes Xanax nightly around bedtime.  Reports only taking 1/4 to 1/2 tablet.  Admits to occasionally taking a quarter of a tablet in high stress situations.  Hypothyroidism-taking Synthroid as prescribed.  No skin/hair changes or heat/cold intolerance.  Stage III CKD-voiding well.  No orthopnea, resting shortness of breath, or edema.  I reviewed the past medical history, family history, social history, surgical history, and allergies today and no changes were needed.  Please see the problem list section below in epic for further details.  Past Medical History: Past Medical History:  Diagnosis Date  . H. pylori infection    treated  . HOH (hard of hearing)   . Hyperlipidemia   . Hypertension   . Hypothyroidism    Past Surgical History: Past Surgical History:  Procedure Laterality Date  . APPENDECTOMY    . CHOLECYSTECTOMY    . THYROIDECTOMY     partial for goiter   Social History: Social History   Socioeconomic History  . Marital status: Widowed    Spouse name: Not on file  . Number of children: 5  . Years of education: 6  . Highest education level: GED or equivalent  Occupational  History    Employer: RETIRED  Tobacco Use  . Smoking status: Former Games developer  . Smokeless tobacco: Never Used  Substance and Sexual Activity  . Alcohol use: No  . Drug use: No  . Sexual activity: Not Currently    Comment: housewife, widowed, lives alone, regular exercise  Other Topics Concern  . Not on file  Social History Narrative   Has back problems and sleeps a lot due to this- back pain   Social Determinants of Health   Financial Resource Strain: Low Risk   . Difficulty of Paying Living Expenses: Not hard at all  Food Insecurity: No Food Insecurity  . Worried About Programme researcher, broadcasting/film/video in the Last Year: Never true  . Ran Out of Food in the Last Year: Never true  Transportation Needs: No Transportation Needs  . Lack of Transportation (Medical): No  . Lack of Transportation (Non-Medical): No  Physical Activity: Inactive  . Days of Exercise per Week: 0 days  . Minutes of Exercise per Session: 0 min  Stress: No Stress Concern Present  . Feeling of Stress : Not at all  Social Connections: Somewhat Isolated  . Frequency of Communication with Friends and Family: More than three times a week  . Frequency of Social Gatherings with Friends and Family: Once a week  . Attends Religious Services: 1 to 4 times per year  . Active Member of Clubs or Organizations: No  . Attends Banker Meetings: Never  . Marital Status: Widowed   Family  History: Family History  Problem Relation Age of Onset  . Cancer Daughter   . Heart disease Sister        unknown specifics   Allergies: Allergies  Allergen Reactions  . Donepezil Other (See Comments)    nightmares  . Ibuprofen Other (See Comments)    Pt unable to recall reaction Patient is not sure what happens if she takes this medicine.  . Memantine Other (See Comments)    Nightmares   . Simvastatin Other (See Comments)    REACTION: myalgias   Medications: See med rec.  Review of Systems: No fevers, chills, night sweats,  weight loss, chest pain, or shortness of breath.   Objective:    General: Well Developed, well nourished, and in no acute distress.  Neuro: Alert and oriented x3.  HEENT: Normocephalic, atraumatic.  Skin: Warm and dry. Cardiac: Regular rate and rhythm, no murmurs rubs or gallops, no lower extremity edema.  Respiratory: Clear to auscultation bilaterally. Not using accessory muscles, speaking in full sentences. Right elbow: Erythema over olecranon process, skin intact. Mild non-pitting edema to medial aspect of Graham at AC/elbow.  Impression and Recommendations:    HYPERTENSION, BENIGN Stable.  Continue Coreg and amlodipine as prescribed.  No changes in regimen today.  Checking CBC, CMP, and lipids today.  Hypothyroidism Checking TSH today.  Continue Synthroid as prescribed.  CKD (chronic kidney disease) stage 3, GFR 30-59 ml/min Checking CMP today.  Generalized anxiety disorder Continue Xanax as prescribed.  Refills provided.  Hyperlipidemia Checking lipids today.  Continue atorvastatin as prescribed.  Impaired fasting glucose Resolved.  POCT HgbA1c 5.2% in office.  Olecranon bursitis, right elbow Doxycycline 100 mg twice daily x7 days.  Return in about 3 months (around 01/02/2020) for HTN, HLD, Hypothyroidism, GAD.  ___________________________________________ Clearnce Sorrel, DNP, APRN, FNP-BC [facility]

## 2019-10-02 NOTE — Patient Instructions (Signed)
Take the antibiotic (Doxycycline) twice a day. 1 tablet in the morning and 1 tablet in the evening  Your Xanax refills have been sent in to the mail order pharmacy.  I will call you in 1 week to see if your elbow is better.

## 2019-10-02 NOTE — Assessment & Plan Note (Signed)
Checking lipids today.  Continue atorvastatin as prescribed.

## 2019-10-02 NOTE — Assessment & Plan Note (Signed)
Continue Xanax as prescribed.  Refills provided.

## 2019-10-02 NOTE — Assessment & Plan Note (Signed)
Checking CMP today

## 2019-10-03 ENCOUNTER — Other Ambulatory Visit: Payer: Self-pay | Admitting: Medical-Surgical

## 2019-10-03 DIAGNOSIS — E039 Hypothyroidism, unspecified: Secondary | ICD-10-CM

## 2019-10-03 LAB — CBC
HCT: 40.4 % (ref 35.0–45.0)
Hemoglobin: 13.2 g/dL (ref 11.7–15.5)
MCH: 29.8 pg (ref 27.0–33.0)
MCHC: 32.7 g/dL (ref 32.0–36.0)
MCV: 91.2 fL (ref 80.0–100.0)
MPV: 9.7 fL (ref 7.5–12.5)
Platelets: 303 10*3/uL (ref 140–400)
RBC: 4.43 10*6/uL (ref 3.80–5.10)
RDW: 12.7 % (ref 11.0–15.0)
WBC: 8.6 10*3/uL (ref 3.8–10.8)

## 2019-10-03 LAB — COMPLETE METABOLIC PANEL WITH GFR
AG Ratio: 1.8 (calc) (ref 1.0–2.5)
ALT: 14 U/L (ref 6–29)
AST: 17 U/L (ref 10–35)
Albumin: 4.1 g/dL (ref 3.6–5.1)
Alkaline phosphatase (APISO): 126 U/L (ref 37–153)
BUN: 16 mg/dL (ref 7–25)
CO2: 28 mmol/L (ref 20–32)
Calcium: 9.8 mg/dL (ref 8.6–10.4)
Chloride: 104 mmol/L (ref 98–110)
Creat: 0.76 mg/dL (ref 0.60–0.88)
GFR, Est African American: 81 mL/min/{1.73_m2} (ref >=60)
GFR, Est Non African American: 70 mL/min/{1.73_m2} (ref >=60)
Globulin: 2.3 g/dL (calc) (ref 1.9–3.7)
Glucose, Bld: 107 mg/dL — ABNORMAL HIGH (ref 65–99)
Potassium: 4.1 mmol/L (ref 3.5–5.3)
Sodium: 139 mmol/L (ref 135–146)
Total Bilirubin: 1 mg/dL (ref 0.2–1.2)
Total Protein: 6.4 g/dL (ref 6.1–8.1)

## 2019-10-03 LAB — LIPID PANEL
Cholesterol: 114 mg/dL (ref <200)
HDL: 51 mg/dL (ref >=50)
LDL Cholesterol (Calc): 47 mg/dL (calc)
Non-HDL Cholesterol (Calc): 63 mg/dL (calc) (ref <130)
Total CHOL/HDL Ratio: 2.2 (calc) (ref <5.0)
Triglycerides: 76 mg/dL (ref <150)

## 2019-10-03 LAB — TSH: TSH: 10.8 mIU/L — ABNORMAL HIGH (ref 0.40–4.50)

## 2019-10-03 MED ORDER — LEVOTHYROXINE SODIUM 100 MCG PO TABS: 100.0000 ug | ORAL_TABLET | Freq: Every day | ORAL | 3 refills | Status: DC

## 2019-10-03 NOTE — Addendum Note (Signed)
Addended byChristen Butter on: 10/03/2019 10:42 AM   Modules accepted: Orders

## 2019-10-03 NOTE — Progress Notes (Signed)
Future labs ordered for patient to get in 6 weeks.

## 2019-10-09 ENCOUNTER — Telehealth: Payer: Self-pay

## 2019-10-09 NOTE — Telephone Encounter (Signed)
Called and spoke with pt to get an update on rt elbow olecranon bursitis. Pt states it is doing much better. She looked in the mirror while on the phone and said there was a small red circle but that was probably from where she had been leaning on her elbow on the table. Pt states swelling has gone down, the skin is peeling a little bit, and denies tenderness. Pt states she took the last doxycycline tablet this morning. Informed pt that this information would be passed along to Christen Butter, DNP, APRN, FNP-BC. Advised that if the symptoms returned or if it stalled in healing to give [place] a call back. Patient verbalized understanding. No further questions or concerns at this time.

## 2019-10-12 DIAGNOSIS — G3184 Mild cognitive impairment, so stated: Secondary | ICD-10-CM | POA: Diagnosis not present

## 2019-10-12 DIAGNOSIS — G5603 Carpal tunnel syndrome, bilateral upper limbs: Secondary | ICD-10-CM | POA: Diagnosis not present

## 2019-10-12 DIAGNOSIS — G5623 Lesion of ulnar nerve, bilateral upper limbs: Secondary | ICD-10-CM | POA: Diagnosis not present

## 2019-10-12 DIAGNOSIS — R202 Paresthesia of skin: Secondary | ICD-10-CM | POA: Diagnosis not present

## 2019-10-12 DIAGNOSIS — M5412 Radiculopathy, cervical region: Secondary | ICD-10-CM | POA: Diagnosis not present

## 2019-10-12 DIAGNOSIS — G40209 Localization-related (focal) (partial) symptomatic epilepsy and epileptic syndromes with complex partial seizures, not intractable, without status epilepticus: Secondary | ICD-10-CM | POA: Diagnosis not present

## 2019-10-12 DIAGNOSIS — M5417 Radiculopathy, lumbosacral region: Secondary | ICD-10-CM | POA: Diagnosis not present

## 2019-10-13 IMAGING — CT CT HEAD WO/W CM
4 of 8 series · 16 of 47 positions shown, 18 images · IV contrast (iopamidol)
Comparison: None.

CLINICAL DATA: Transient alteration of awareness. Recent dizziness
and syncopal episodes.

EXAM:
CT HEAD WITHOUT AND WITH CONTRAST
TECHNIQUE: Contiguous axial images were obtained from the base of the skull
through the vertex without and with intravenous contrast
CONTRAST:  75mL MQI84W-PRR IOPAMIDOL (MQI84W-PRR) INJECTION 61%

[Series 2: head wo · axial · 0.39mm/px · z∈[-169,-69]mm · 5 of 30 slices shown]
[im 5/30  brain]
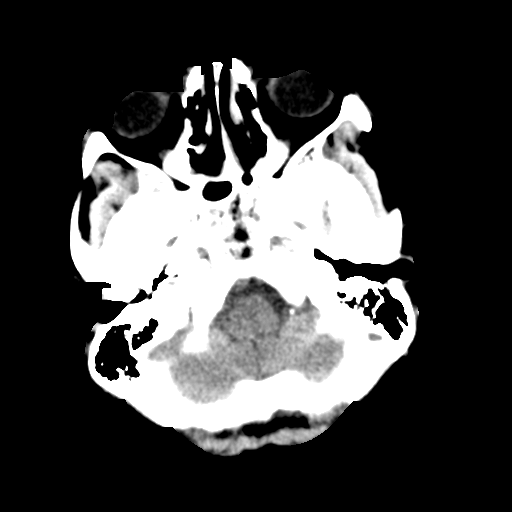
[im 10/30  brain]
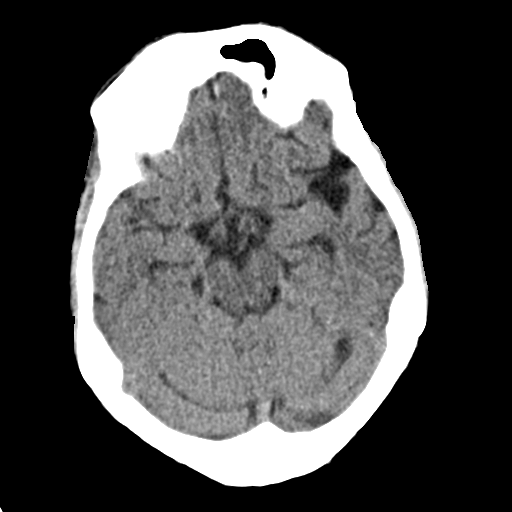
[im 15/30  brain]
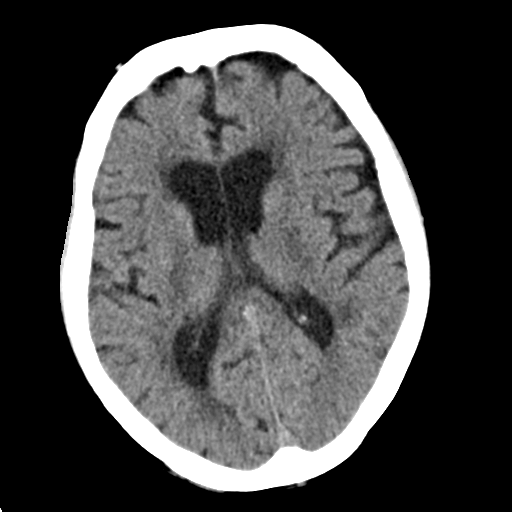
[im 20/30  brain]
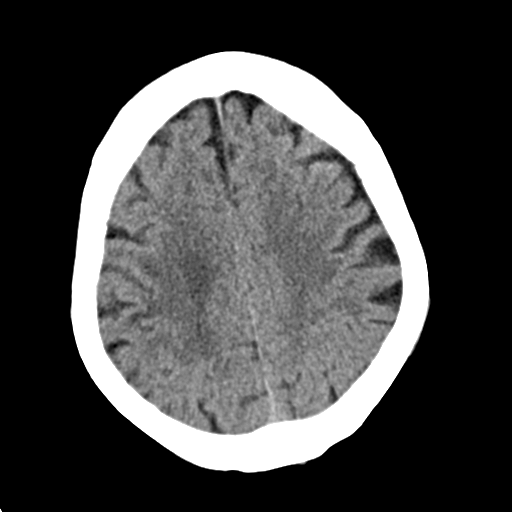
[im 25/30  brain]
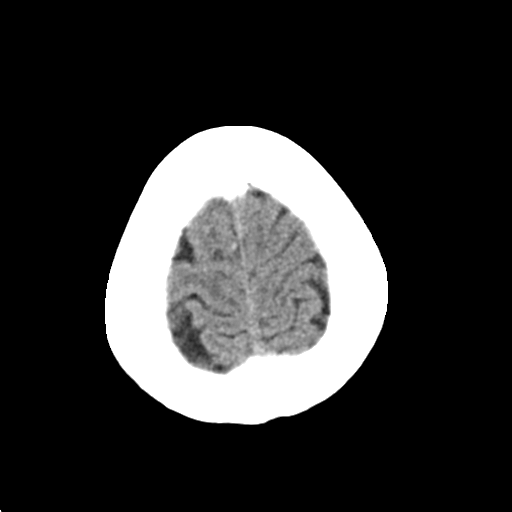

[Series 4: cor wo soft · coronal · 0.33mm/px · 3 of 67 slices shown]
[im 17/67  brain]
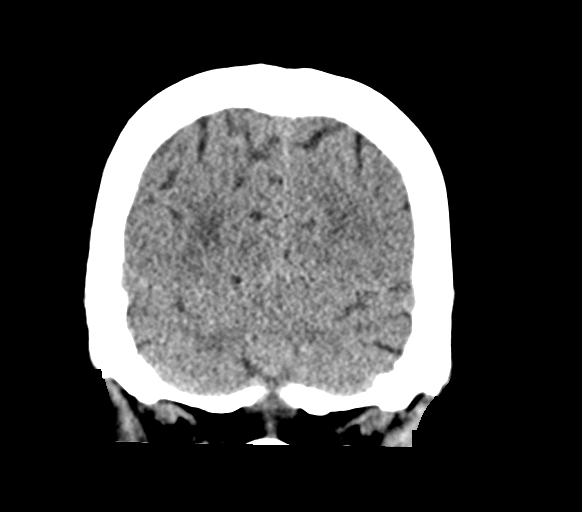
[im 34/67  brain]
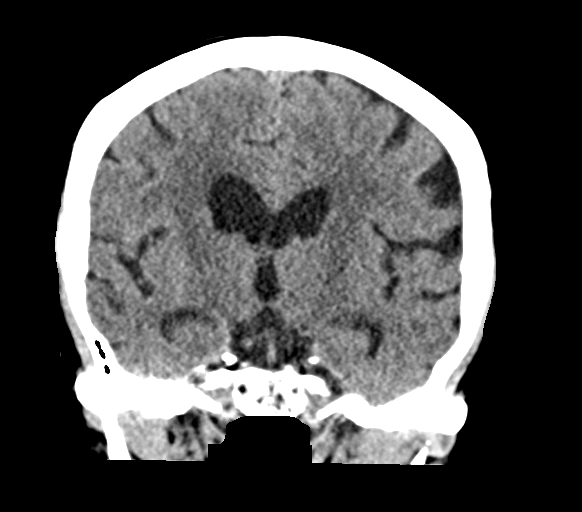
[im 50/67  brain]
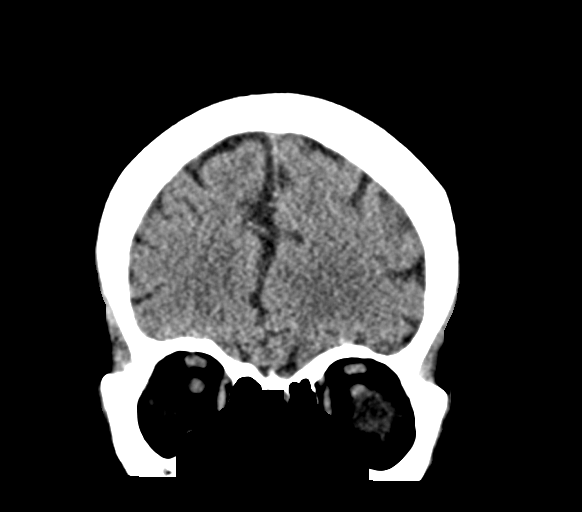

[Series 6: head w soft · axial · 0.39mm/px · z∈[-169,-69]mm · 6 of 30 slices shown, 8 images]
[im 5/30  brain]
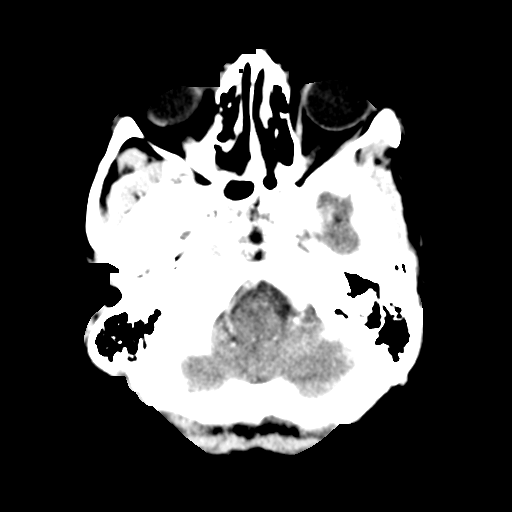
[im 5/30  bone]
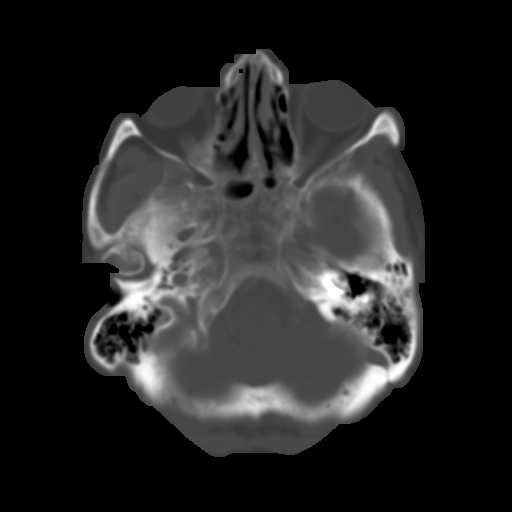
[im 9/30  brain]
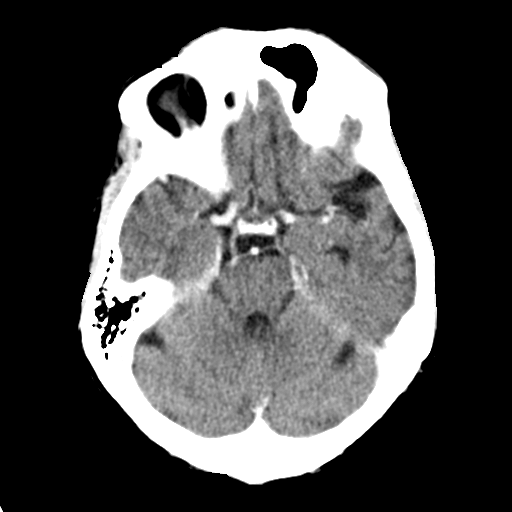
[im 13/30  brain]
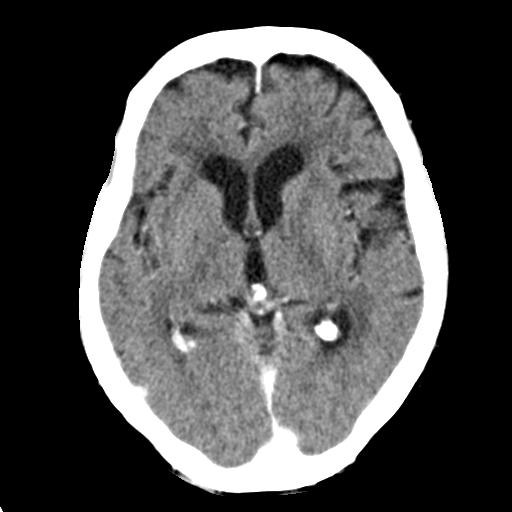
[im 17/30  brain]
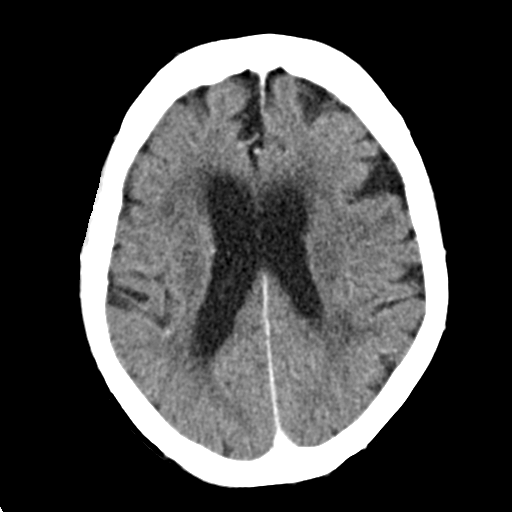
[im 21/30  brain]
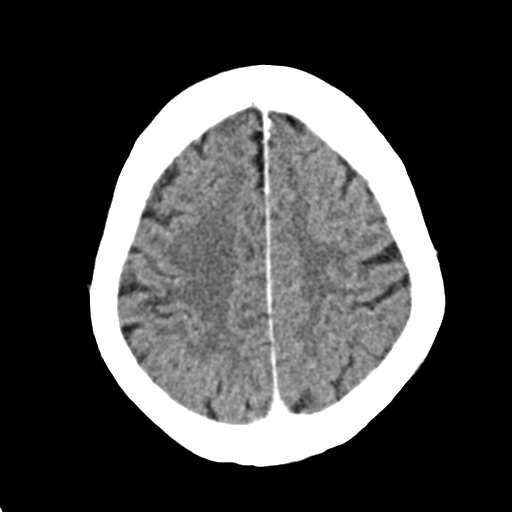
[im 21/30  bone]
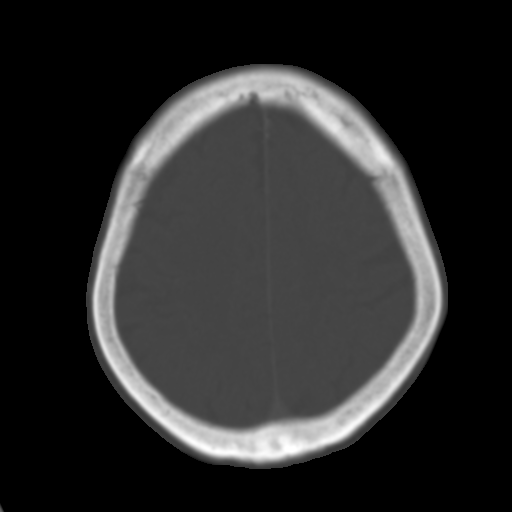
[im 25/30  brain]
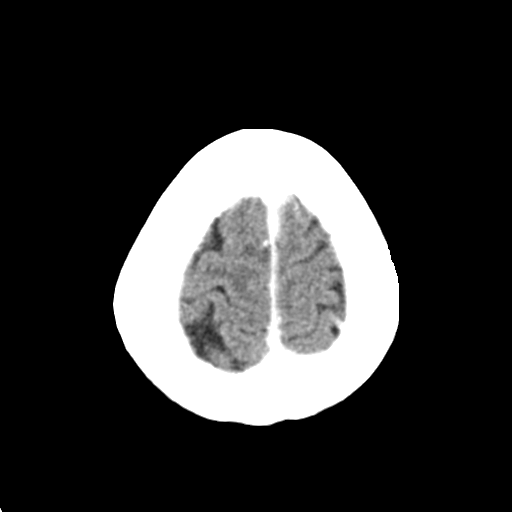

[Series 9: sag soft · sagittal · 0.30mm/px · 2 of 69 slices shown]
[im 23/69  brain]
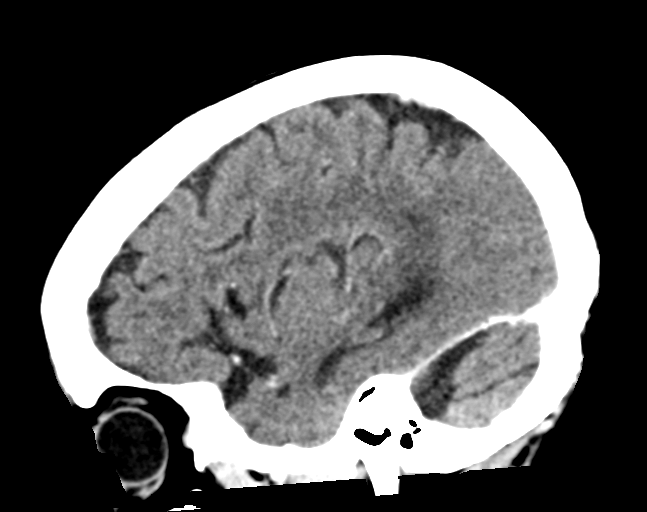
[im 46/69  brain]
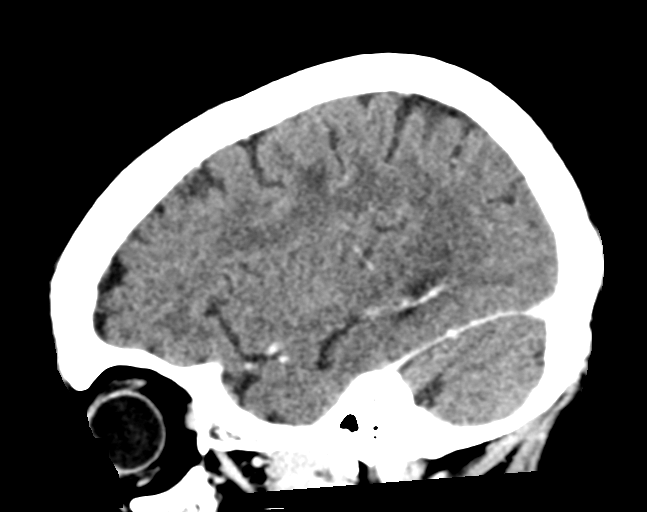

[16 of 47 positions shown; findings below may reference images not displayed]

FINDINGS: Brain: Small area of right posterior frontal cortical low-density
with subtle volume loss. No hemorrhage, hydrocephalus, masslike
finding, or collection. Mild to moderate small vessel ischemic type
change in the cerebral white matter. Mild for age cerebral volume
loss.

Vascular: No hyperdense vessel or unexpected calcification. Visible
vessels are patent.

Skull: Negative

Sinuses/Orbits: No pathologic finding. Bilateral cataract resection.
IMPRESSION: 1. Small right posterior frontal cortex infarct that is favored
remote.
2. Mild-to-moderate chronic small vessel ischemia in the cerebral
white matter.

## 2019-10-16 DIAGNOSIS — I4891 Unspecified atrial fibrillation: Secondary | ICD-10-CM | POA: Diagnosis not present

## 2019-10-16 DIAGNOSIS — I4892 Unspecified atrial flutter: Secondary | ICD-10-CM | POA: Diagnosis not present

## 2019-10-31 ENCOUNTER — Telehealth: Payer: Self-pay | Admitting: Family Medicine

## 2019-10-31 NOTE — Telephone Encounter (Signed)
Patient states that she is suppose to get lab work done. She wants to come in for that and follow up with PCP the same date. She wanted to know if she can come sooner than the 3 month follow up she has. Please advise.

## 2019-11-01 NOTE — Telephone Encounter (Signed)
Needs to wait the 6 weeks for blood work, but can follow up with PCP sooner on other concerns if she needs to

## 2019-11-01 NOTE — Telephone Encounter (Signed)
Patient has been made aware of this. No further questions at this time.  

## 2019-11-06 DIAGNOSIS — Z79899 Other long term (current) drug therapy: Secondary | ICD-10-CM | POA: Diagnosis not present

## 2019-11-13 ENCOUNTER — Other Ambulatory Visit: Payer: Self-pay | Admitting: Family Medicine

## 2019-11-13 DIAGNOSIS — E785 Hyperlipidemia, unspecified: Secondary | ICD-10-CM

## 2019-11-15 DIAGNOSIS — I480 Paroxysmal atrial fibrillation: Secondary | ICD-10-CM | POA: Diagnosis not present

## 2019-11-15 DIAGNOSIS — I10 Essential (primary) hypertension: Secondary | ICD-10-CM | POA: Diagnosis not present

## 2019-11-15 DIAGNOSIS — I495 Sick sinus syndrome: Secondary | ICD-10-CM | POA: Diagnosis not present

## 2019-11-22 ENCOUNTER — Other Ambulatory Visit: Payer: Self-pay | Admitting: Family Medicine

## 2019-12-01 ENCOUNTER — Telehealth: Payer: Self-pay

## 2019-12-01 NOTE — Telephone Encounter (Signed)
Tina Graham called and states she has warts on her legs. I tried to schedule her for an appointment. She wanted to come in today. I did advise patient we are full today but I could schedule her for next week. She said she wanted to be seen today and hung up the phone.

## 2019-12-02 DIAGNOSIS — Z886 Allergy status to analgesic agent status: Secondary | ICD-10-CM | POA: Diagnosis not present

## 2019-12-02 DIAGNOSIS — K219 Gastro-esophageal reflux disease without esophagitis: Secondary | ICD-10-CM | POA: Diagnosis not present

## 2019-12-02 DIAGNOSIS — E039 Hypothyroidism, unspecified: Secondary | ICD-10-CM | POA: Diagnosis not present

## 2019-12-02 DIAGNOSIS — Z888 Allergy status to other drugs, medicaments and biological substances status: Secondary | ICD-10-CM | POA: Diagnosis not present

## 2019-12-02 DIAGNOSIS — I10 Essential (primary) hypertension: Secondary | ICD-10-CM | POA: Diagnosis not present

## 2019-12-02 DIAGNOSIS — M810 Age-related osteoporosis without current pathological fracture: Secondary | ICD-10-CM | POA: Diagnosis not present

## 2019-12-02 DIAGNOSIS — Z87891 Personal history of nicotine dependence: Secondary | ICD-10-CM | POA: Diagnosis not present

## 2019-12-02 DIAGNOSIS — E785 Hyperlipidemia, unspecified: Secondary | ICD-10-CM | POA: Diagnosis not present

## 2019-12-02 DIAGNOSIS — I444 Left anterior fascicular block: Secondary | ICD-10-CM | POA: Diagnosis not present

## 2019-12-07 ENCOUNTER — Ambulatory Visit (INDEPENDENT_AMBULATORY_CARE_PROVIDER_SITE_OTHER): Payer: Medicare HMO | Admitting: Family Medicine

## 2019-12-07 ENCOUNTER — Other Ambulatory Visit: Payer: Self-pay

## 2019-12-07 ENCOUNTER — Encounter: Payer: Self-pay | Admitting: Family Medicine

## 2019-12-07 ENCOUNTER — Telehealth: Payer: Self-pay

## 2019-12-07 VITALS — BP 149/84 | HR 76 | Ht 60.0 in | Wt 117.0 lb

## 2019-12-07 DIAGNOSIS — E039 Hypothyroidism, unspecified: Secondary | ICD-10-CM | POA: Diagnosis not present

## 2019-12-07 DIAGNOSIS — Z886 Allergy status to analgesic agent status: Secondary | ICD-10-CM | POA: Diagnosis not present

## 2019-12-07 DIAGNOSIS — I444 Left anterior fascicular block: Secondary | ICD-10-CM | POA: Diagnosis not present

## 2019-12-07 DIAGNOSIS — K219 Gastro-esophageal reflux disease without esophagitis: Secondary | ICD-10-CM | POA: Diagnosis not present

## 2019-12-07 DIAGNOSIS — Z888 Allergy status to other drugs, medicaments and biological substances status: Secondary | ICD-10-CM | POA: Diagnosis not present

## 2019-12-07 DIAGNOSIS — E785 Hyperlipidemia, unspecified: Secondary | ICD-10-CM | POA: Diagnosis not present

## 2019-12-07 DIAGNOSIS — M810 Age-related osteoporosis without current pathological fracture: Secondary | ICD-10-CM | POA: Diagnosis not present

## 2019-12-07 DIAGNOSIS — Z87891 Personal history of nicotine dependence: Secondary | ICD-10-CM | POA: Diagnosis not present

## 2019-12-07 DIAGNOSIS — R21 Rash and other nonspecific skin eruption: Secondary | ICD-10-CM | POA: Diagnosis not present

## 2019-12-07 DIAGNOSIS — L570 Actinic keratosis: Secondary | ICD-10-CM

## 2019-12-07 DIAGNOSIS — I10 Essential (primary) hypertension: Secondary | ICD-10-CM | POA: Diagnosis not present

## 2019-12-07 DIAGNOSIS — H539 Unspecified visual disturbance: Secondary | ICD-10-CM | POA: Diagnosis not present

## 2019-12-07 MED ORDER — TRIAMCINOLONE ACETONIDE 0.1 % EX CREA: 1.0000 "application " | TOPICAL_CREAM | Freq: Two times a day (BID) | CUTANEOUS | 0 refills | Status: DC

## 2019-12-07 NOTE — Telephone Encounter (Signed)
Tina Graham is taking Levothyroxine 100 mcg daily since October 04, 2019. She was also prescribed lisinopril 20 mg by Kayren Eaves, NP. I have added this to her medication list.

## 2019-12-07 NOTE — Assessment & Plan Note (Signed)
She is quite adamant that she was actually already taking 100 mcg before her blood work was drawn but I do not see any prescriptions on file from our office.  So recommend just recheck the TSH today she says she is taking 100 mg currently.  And that way we can make any adjustments or make sure she has refills sent to her mail order if needed.

## 2019-12-07 NOTE — Assessment & Plan Note (Signed)
Decided to restart the amlodipine at 5 mg she was actually on that previously until it was bumped up to 10 mg when she had swelling.  So I think it is absolutely reasonable to get her back on the 5 mg and just monitor carefully.  She is also now taking lisinopril and her carvedilol.  To see her back in 2 to 3 weeks to see if she is doing well and if the blood pressures level out.  Just remind her it can take up to 2 weeks to reach full efficacy with a new blood pressure pill.

## 2019-12-07 NOTE — Progress Notes (Signed)
Hospital follow-up  Established Patient Office Visit  Subjective:  Patient ID: Tina Graham, female    DOB: 05-Nov-1928  Age: 86 y.o. MRN: [id number]  CC:  Chief Complaint  Patient presents with  . Hospitalization Follow-up    HPI Tina Graham presents for hospital follow-up.  She went to the emergency department on May 1 for significantly elevated blood pressure with a systolic right around 100. Hx of Afib s/p ablation with pacemaker. The time she got to the emergency department her blood pressure was 142/100.  She was not having significant symptoms they did do an EKG and then released her home they did not feel like any additional intervention needed to occur at that time.  She just restarted her amlodipine 5 mg daily tab.  She said she tried to take the hydrochlorothiazide 12.5 mg but that it made her urinate too much so she stopped it.  Hypothyroidism-last TSH was elevated around 10 she is actually been on 88 mcg since 2019 and previous to that was on 112 mcg.  She swears that she was actually already on 100 mcg daily when she had the blood work drawn in March so this is very confusing and asked her if it came from another provider and she did not think so. She says she takes her medication very regularly.    Also has a rash on the left antecubital fossa she thinks it is from where the blood pressure cuffs rubs when she takes her blood pressure which she takes about 4-5 times a day.  He did bring in her blood pressure log with her today.  Blood pressures are running from 130-160s.  Past Medical History:  Diagnosis Date  . H. pylori infection    treated  . HOH (hard of hearing)   . Hyperlipidemia   . Hypertension   . Hypothyroidism     Past Surgical History:  Procedure Laterality Date  . APPENDECTOMY    . CHOLECYSTECTOMY    . THYROIDECTOMY     partial for goiter    Family History  Problem Relation Age of Onset  . Cancer Daughter   . Heart disease Sister    unknown specifics    Social History   Socioeconomic History  . Marital status: Widowed    Spouse name: Not on file  . Number of children: 5  . Years of education: 6  . Highest education level: GED or equivalent  Occupational History    Employer: RETIRED  Tobacco Use  . Smoking status: Former Games developer  . Smokeless tobacco: Never Used  Substance and Sexual Activity  . Alcohol use: No  . Drug use: No  . Sexual activity: Not Currently    Comment: housewife, widowed, lives alone, regular exercise  Other Topics Concern  . Not on file  Social History Narrative   Has back problems and sleeps a lot due to this- back pain   Social Determinants of Health   Financial Resource Strain: Low Risk   . Difficulty of Paying Living Expenses: Not hard at all  Food Insecurity: No Food Insecurity  . Worried About Programme researcher, broadcasting/film/video in the Last Year: Never true  . Ran Out of Food in the Last Year: Never true  Transportation Needs: No Transportation Needs  . Lack of Transportation (Medical): No  . Lack of Transportation (Non-Medical): No  Physical Activity: Inactive  . Days of Exercise per Week: 0 days  . Minutes of Exercise per Session: 0 min  Stress:  No Stress Concern Present  . Feeling of Stress : Not at all  Social Connections: Somewhat Isolated  . Frequency of Communication with Friends and Family: More than three times a week  . Frequency of Social Gatherings with Friends and Family: Once a week  . Attends Religious Services: 1 to 4 times per year  . Active Member of Clubs or Organizations: No  . Attends Banker Meetings: Never  . Marital Status: Widowed  Intimate Partner Violence: Not At Risk  . Fear of Current or Ex-Partner: No  . Emotionally Abused: No  . Physically Abused: No  . Sexually Abused: No    Outpatient Medications Prior to Visit  Medication Sig Dispense Refill  . levothyroxine (SYNTHROID) 100 MCG tablet Take 1 tablet (100 mcg total) by mouth daily. 90  tablet 3  . Omega-3 Fatty Acids (FISH OIL) 1000 MG CAPS Take 1,000 mg by mouth daily.     Marland Kitchen ALPRAZolam (XANAX) 1 MG tablet TAKE 1 TABLET AT BEDTIME AS NEEDED FOR ANXIETY. 30 tablet 2  . amLODipine (NORVASC) 5 MG tablet Take 1 tablet (5 mg total) by mouth daily. 180 tablet 1  . aspirin EC 81 MG tablet Take 81 mg by mouth daily.    Marland Kitchen atorvastatin (LIPITOR) 40 MG tablet TAKE 1 TABLET EVERY DAY 90 tablet 3  . carvedilol (COREG) 25 MG tablet     . lamoTRIgine (LAMICTAL) 25 MG tablet Take 50 mg by mouth 2 (two) times daily.      No facility-administered medications prior to visit.    Allergies  Allergen Reactions  . Donepezil Other (See Comments)    nightmares  . Ibuprofen Other (See Comments)    Pt unable to recall reaction Patient is not sure what happens if she takes this medicine.  . Memantine Other (See Comments)    Nightmares   . Simvastatin Other (See Comments)    REACTION: myalgias    ROS Review of Systems    Objective:    Physical Exam  Constitutional: She is oriented to person, place, and time. She appears well-developed and well-nourished.  HENT:  Head: Normocephalic and atraumatic.  Cardiovascular: Normal rate, regular rhythm and normal heart sounds.  Pulmonary/Chest: Effort normal and breath sounds normal.  Neurological: She is alert and oriented to person, place, and time.  Skin: Skin is warm and dry.  The dry erythematous well-demarcated rash in the left antecubital fossa.  On her scalp along her hairline part she has several scattered actinic keratoses.  Psychiatric: She has a normal mood and affect. Her behavior is normal.    BP (!) 149/84   Pulse 76   Ht 5' (1.524 m)   Wt 117 lb (53.1 kg)   SpO2 98%   BMI 22.85 kg/m  Wt Readings from Last 3 Encounters:  12/07/19 117 lb (53.1 kg)  10/02/19 121 lb 1.3 oz (54.9 kg)  03/20/19 118 lb (53.5 kg)     There are no preventive care reminders to display for this patient.  There are no preventive care  reminders to display for this patient.  Lab Results  Component Value Date   TSH 10.80 (H) 10/02/2019   Lab Results  Component Value Date   WBC 8.6 10/02/2019   HGB 13.2 10/02/2019   HCT 40.4 10/02/2019   MCV 91.2 10/02/2019   PLT 303 10/02/2019   Lab Results  Component Value Date   NA 139 10/02/2019   K 4.1 10/02/2019   CO2 28 10/02/2019  GLUCOSE 107 (H) 10/02/2019   BUN 16 10/02/2019   CREATININE 0.76 10/02/2019   BILITOT 1.0 10/02/2019   ALKPHOS 98 11/05/2016   AST 17 10/02/2019   ALT 14 10/02/2019   PROT 6.4 10/02/2019   ALBUMIN 4.0 11/05/2016   CALCIUM 9.8 10/02/2019   Lab Results  Component Value Date   CHOL 114 10/02/2019   Lab Results  Component Value Date   HDL 51 10/02/2019   Lab Results  Component Value Date   LDLCALC 47 10/02/2019   Lab Results  Component Value Date   TRIG 76 10/02/2019   Lab Results  Component Value Date   CHOLHDL 2.2 10/02/2019   Lab Results  Component Value Date   HGBA1C 5.2 10/02/2019      Assessment & Plan:   Problem List Items Addressed This Visit      Cardiovascular and Mediastinum   HYPERTENSION, BENIGN - Primary    Decided to restart the amlodipine at 5 mg she was actually on that previously until it was bumped up to 10 mg when she had swelling.  So I think it is absolutely reasonable to get her back on the 5 mg and just monitor carefully.  She is also now taking lisinopril and her carvedilol.  To see her back in 2 to 3 weeks to see if she is doing well and if the blood pressures level out.  Just remind her it can take up to 2 weeks to reach full efficacy with a new blood pressure pill.        Endocrine   Hypothyroidism    She is quite adamant that she was actually already taking 100 mcg before her blood work was drawn but I do not see any prescriptions on file from our office.  So recommend just recheck the TSH today she says she is taking 100 mg currently.  And that way we can make any adjustments or make sure  she has refills sent to her mail order if needed.        Musculoskeletal and Integument   Actinic keratoses    Other Visit Diagnoses    Rash         Rash-it really just looks like eczema in the antecubital fossa.  We did discuss where to properly place the blood pressure cuff as it looks like she has been placing it over the elbow crease.  Recommend a trial of a topical triamcinolone to see if it resolves if not improving then please let me know.  Actinic keratoses-recommend return for cryotherapy and/or dermatology referral.  Meds ordered this encounter  Medications  . triamcinolone cream (KENALOG) 0.1 %    Sig: Apply 1 application topically 2 (two) times daily.    Dispense:  30 g    Refill:  0    Follow-up: Return in about 2 weeks (around 12/21/2019) for Hypertension and freeze lesion on scalp.    Beatrice Lecher, MD

## 2019-12-08 ENCOUNTER — Telehealth: Payer: Self-pay | Admitting: Neurology

## 2019-12-08 LAB — TSH: TSH: 1.98 mIU/L (ref 0.40–4.50)

## 2019-12-08 MED ORDER — CARVEDILOL 6.25 MG PO TABS
25.00 | ORAL_TABLET | ORAL | Status: DC
Start: 2019-12-08 — End: 2019-12-08

## 2019-12-08 MED ORDER — TRIAMCINOLONE ACETONIDE 0.1 % EX CREA: 1.0000 "application " | TOPICAL_CREAM | Freq: Two times a day (BID) | CUTANEOUS | 0 refills | Status: DC

## 2019-12-08 NOTE — Addendum Note (Signed)
Addended bySilvio Pate on: 12/08/2019 01:19 PM   Modules accepted: Orders

## 2019-12-08 NOTE — Telephone Encounter (Signed)
Spoke with patient she confirmed she is taking:  Amlodipine 5 mg QD (decreased from 10 mg due to swelling) Lisinopril 20 mg QD  Carvedilol 25 mg BID  These are only meds taking for blood pressure currently. FYI  Patient requested triamcinolone cream be sent to [facility] instead of [facility]. RX resent.

## 2019-12-08 NOTE — Addendum Note (Signed)
Addended by: Nani Gasser D on: 12/08/2019 04:08 PM   Modules accepted: Orders

## 2019-12-08 NOTE — Telephone Encounter (Signed)
OK just make sure taking the amlodipine, lisinopril and carvedilol. Have her check her pills to make sure taking the carvedilol.

## 2019-12-08 NOTE — Telephone Encounter (Signed)
Called patient for lab results, she wanted me to let Dr. Linford Arnold know she was back in the hospital last night about her blood pressure. She had an appointment with Dr. Linford Arnold yesterday. I tried to make sooner follow up with patient, but she just wanted Dr. Linford Arnold to look at ER note. She went to [place]. Note in Care Everywhere.   Dr. Linford Arnold - FYI.

## 2019-12-21 DIAGNOSIS — I480 Paroxysmal atrial fibrillation: Secondary | ICD-10-CM | POA: Diagnosis not present

## 2019-12-21 DIAGNOSIS — I10 Essential (primary) hypertension: Secondary | ICD-10-CM | POA: Diagnosis not present

## 2019-12-21 DIAGNOSIS — I495 Sick sinus syndrome: Secondary | ICD-10-CM | POA: Diagnosis not present

## 2020-01-02 ENCOUNTER — Ambulatory Visit (INDEPENDENT_AMBULATORY_CARE_PROVIDER_SITE_OTHER): Payer: Medicare HMO | Admitting: Family Medicine

## 2020-01-02 ENCOUNTER — Encounter: Payer: Self-pay | Admitting: Family Medicine

## 2020-01-02 VITALS — BP 166/84 | HR 77 | Ht 60.0 in | Wt 118.0 lb

## 2020-01-02 DIAGNOSIS — L821 Other seborrheic keratosis: Secondary | ICD-10-CM

## 2020-01-02 DIAGNOSIS — M5412 Radiculopathy, cervical region: Secondary | ICD-10-CM | POA: Insufficient documentation

## 2020-01-02 DIAGNOSIS — F411 Generalized anxiety disorder: Secondary | ICD-10-CM

## 2020-01-02 DIAGNOSIS — I10 Essential (primary) hypertension: Secondary | ICD-10-CM

## 2020-01-02 DIAGNOSIS — E785 Hyperlipidemia, unspecified: Secondary | ICD-10-CM | POA: Diagnosis not present

## 2020-01-02 DIAGNOSIS — E039 Hypothyroidism, unspecified: Secondary | ICD-10-CM

## 2020-01-02 DIAGNOSIS — N183 Chronic kidney disease, stage 3 unspecified: Secondary | ICD-10-CM

## 2020-01-02 MED ORDER — AMLODIPINE BESYLATE 5 MG PO TABS: 5.0000 mg | ORAL_TABLET | Freq: Every day | ORAL | 1 refills | Status: DC

## 2020-01-02 NOTE — Assessment & Plan Note (Signed)
Given a prescription for gabapentin from Dr. Quentin Mulling.  I do not have his full notes but she says she decided not to take the medication.  Using a topical and Tylenol seems to help her pain so just encouraged her to stick with that I think that is probably safer in the long-term for her kidney function and for her blood pressure.

## 2020-01-02 NOTE — Assessment & Plan Note (Signed)
Following renal func Q 6 mo.

## 2020-01-02 NOTE — Progress Notes (Signed)
Established Patient Office Visit  Subjective:  Patient ID: Tina Graham, female    DOB: 1928-09-12  Age: 86 y.o. MRN: [id number]  CC:  Chief Complaint  Patient presents with  . Follow-up    HPI Tina Graham presents for follow-up hypertension.  She has had some recent adjustments to her medication.  She is now on lisinopril and low-dose of amlodipine.  She had significant swelling of the lower extremities on 10 mg of amlodipine.  Patient brought in her blood pressure log with her today.  Hypothyroidism-recent labs from May 6 showed that her TSH was normal at 1.9.  Adjustments were made to her regimen.  We will need to plan to check again in 6 months.  She also has several seborrheic keratoses on her scalp that she would like frozen today.   She says she did see the specialist, Dr. Marylene Land, about the pain in her arms and legs.  She said she was given a prescription for the pain method for comfortable taking it.  Based on her refill history it looks like it was probably gabapentin.  She said she was told that it was from the injured nerves causing her pain.  She says for now she is using something called Arctic ice which she rubs on her arms and then occasionally will take half of a Tylenol.  She says that works really well and she can get a good night sleep.  Past Medical History:  Diagnosis Date  . H. pylori infection    treated  . HOH (hard of hearing)   . Hyperlipidemia   . Hypertension   . Hypothyroidism     Past Surgical History:  Procedure Laterality Date  . APPENDECTOMY    . CHOLECYSTECTOMY    . THYROIDECTOMY     partial for goiter    Family History  Problem Relation Age of Onset  . Cancer Daughter   . Heart disease Sister        unknown specifics    Social History   Socioeconomic History  . Marital status: Widowed    Spouse name: Not on file  . Number of children: 5  . Years of education: 6  . Highest education level: GED or equivalent  Occupational  History    Employer: RETIRED  Tobacco Use  . Smoking status: Former Games developer  . Smokeless tobacco: Never Used  Substance and Sexual Activity  . Alcohol use: No  . Drug use: No  . Sexual activity: Not Currently    Comment: housewife, widowed, lives alone, regular exercise  Other Topics Concern  . Not on file  Social History Narrative   Has back problems and sleeps a lot due to this- back pain   Social Determinants of Health   Financial Resource Strain: Low Risk   . Difficulty of Paying Living Expenses: Not hard at all  Food Insecurity: No Food Insecurity  . Worried About Programme researcher, broadcasting/film/video in the Last Year: Never true  . Ran Out of Food in the Last Year: Never true  Transportation Needs: No Transportation Needs  . Lack of Transportation (Medical): No  . Lack of Transportation (Non-Medical): No  Physical Activity: Inactive  . Days of Exercise per Week: 0 days  . Minutes of Exercise per Session: 0 min  Stress: No Stress Concern Present  . Feeling of Stress : Not at all  Social Connections: Somewhat Isolated  . Frequency of Communication with Friends and Family: More than three times a  week  . Frequency of Social Gatherings with Friends and Family: Once a week  . Attends Religious Services: 1 to 4 times per year  . Active Member of Clubs or Organizations: No  . Attends Banker Meetings: Never  . Marital Status: Widowed  Intimate Partner Violence: Not At Risk  . Fear of Current or Ex-Partner: No  . Emotionally Abused: No  . Physically Abused: No  . Sexually Abused: No    Outpatient Medications Prior to Visit  Medication Sig Dispense Refill  . ALPRAZolam (XANAX) 1 MG tablet TAKE 1 TABLET AT BEDTIME AS NEEDED FOR ANXIETY. 30 tablet 2  . aspirin EC 81 MG tablet Take 81 mg by mouth daily.    Marland Kitchen atorvastatin (LIPITOR) 40 MG tablet TAKE 1 TABLET EVERY DAY 90 tablet 3  . Bioflavonoid Products (BIOFLEX PO) Take by mouth.    . carvedilol (COREG) 25 MG tablet     .  hydrochlorothiazide (HYDRODIURIL) 12.5 MG tablet     . lamoTRIgine (LAMICTAL) 25 MG tablet Take 50 mg by mouth 2 (two) times daily.     Marland Kitchen levothyroxine (SYNTHROID) 100 MCG tablet Take 1 tablet (100 mcg total) by mouth daily. 90 tablet 3  . lisinopril (ZESTRIL) 20 MG tablet Take 20 mg by mouth daily.    . Multiple Vitamins-Minerals (PRESERVISION AREDS 2 PO) Take by mouth.    . Omega-3 Fatty Acids (FISH OIL) 1000 MG CAPS Take 1,000 mg by mouth daily.     Marland Kitchen triamcinolone cream (KENALOG) 0.1 % Apply 1 application topically 2 (two) times daily. 30 g 0  . amLODipine (NORVASC) 5 MG tablet Take 1 tablet (5 mg total) by mouth daily. 180 tablet 1  . gabapentin (NEURONTIN) 300 MG capsule      No facility-administered medications prior to visit.    Allergies  Allergen Reactions  . Donepezil Other (See Comments)    nightmares  . Ibuprofen Other (See Comments)    Pt unable to recall reaction Patient is not sure what happens if she takes this medicine.  . Memantine Other (See Comments)    Nightmares   . Simvastatin Other (See Comments)    REACTION: myalgias    ROS Review of Systems    Objective:    Physical Exam  Constitutional: She is oriented to person, place, and time. She appears well-developed and well-nourished.  HENT:  Head: Normocephalic and atraumatic.  Cardiovascular: Normal rate, regular rhythm and normal heart sounds.  Pulmonary/Chest: Effort normal and breath sounds normal.  Neurological: She is alert and oriented to person, place, and time.  Skin: Skin is warm and dry.  Psychiatric: She has a normal mood and affect. Her behavior is normal.    BP (!) 166/84   Pulse 77   Ht 5' (1.524 m)   Wt 118 lb (53.5 kg)   SpO2 100%   BMI 23.05 kg/m  Wt Readings from Last 3 Encounters:  01/02/20 118 lb (53.5 kg)  12/07/19 117 lb (53.1 kg)  10/02/19 121 lb 1.3 oz (54.9 kg)     There are no preventive care reminders to display for this patient.  There are no preventive care  reminders to display for this patient.  Lab Results  Component Value Date   TSH 1.98 12/07/2019   Lab Results  Component Value Date   WBC 8.6 10/02/2019   HGB 13.2 10/02/2019   HCT 40.4 10/02/2019   MCV 91.2 10/02/2019   PLT 303 10/02/2019   Lab Results  Component Value Date   NA 139 10/02/2019   K 4.1 10/02/2019   CO2 28 10/02/2019   GLUCOSE 107 (H) 10/02/2019   BUN 16 10/02/2019   CREATININE 0.76 10/02/2019   BILITOT 1.0 10/02/2019   ALKPHOS 98 11/05/2016   AST 17 10/02/2019   ALT 14 10/02/2019   PROT 6.4 10/02/2019   ALBUMIN 4.0 11/05/2016   CALCIUM 9.8 10/02/2019   Lab Results  Component Value Date   CHOL 114 10/02/2019   Lab Results  Component Value Date   HDL 51 10/02/2019   Lab Results  Component Value Date   LDLCALC 47 10/02/2019   Lab Results  Component Value Date   TRIG 76 10/02/2019   Lab Results  Component Value Date   CHOLHDL 2.2 10/02/2019   Lab Results  Component Value Date   HGBA1C 5.2 10/02/2019      Assessment & Plan:   Problem List Items Addressed This Visit      Cardiovascular and Mediastinum   HYPERTENSION, BENIGN - Primary    Blood pressures have been much better.  He feels like they have really leveled out since being back on the amlodipine.  She actually has 10 mg and has been splitting them.  We will go ahead and send over new prescription for amlodipine 5 mg to pharmacy.      Relevant Medications   hydrochlorothiazide (HYDRODIURIL) 12.5 MG tablet   amLODipine (NORVASC) 5 MG tablet     Endocrine   Hypothyroidism    Controlled.  Plan to recheck level again in 6 months.  Reminded her to dispose of all her old thyroid medications.         Nervous and Auditory   Cervical radiculopathy    Given a prescription for gabapentin from Dr. Quentin Mulling.  I do not have his full notes but she says she decided not to take the medication.  Using a topical and Tylenol seems to help her pain so just encouraged her to stick with that I  think that is probably safer in the long-term for her kidney function and for her blood pressure.        Genitourinary   CKD (chronic kidney disease) stage 3, GFR 30-59 ml/min    Following renal func Q 6 mo.         Other   Hyperlipidemia    Tolerating statin well without any side effects or problems.      Relevant Medications   hydrochlorothiazide (HYDRODIURIL) 12.5 MG tablet   amLODipine (NORVASC) 5 MG tablet   Generalized anxiety disorder    Other Visit Diagnoses    Seborrheic keratoses          Cryotherapy Procedure Note  Pre-operative Diagnosis: seb keratoses, several on the scalp  Post-operative Diagnosis: same  Locations: scalp  Indications: irritation on scalp.   Anesthesia: not required    Procedure Details  Patient informed of risks (permanent scarring, infection, light or dark discoloration, bleeding, infection, weakness, numbness and recurrence of the lesion) and benefits of the procedure and verbal informed consent obtained.  The areas are treated with liquid nitrogen therapy, frozen until ice ball extended 1-2 mm beyond lesion, allowed to thaw, and treated again. The patient tolerated procedure well.  The patient was instructed on post-op care, warned that there may be blister formation, redness and pain. Recommend OTC analgesia as needed for pain.  Condition: Stable  Complications: none.  Plan: 1. Instructed to keep the area dry and covered for 24-48h and  clean thereafter. 2. Warning signs of infection were reviewed.   3. Recommended that the patient use OTC acetaminophen as needed for pain.  4. Return PRN.     Meds ordered this encounter  Medications  . amLODipine (NORVASC) 5 MG tablet    Sig: Take 1 tablet (5 mg total) by mouth daily.    Dispense:  90 tablet    Refill:  1    Follow-up: Return in about 3 months (around 04/03/2020) for Hypertension.    Beatrice Lecher, MD

## 2020-01-02 NOTE — Assessment & Plan Note (Addendum)
Blood pressures have been much better.  He feels like they have really leveled out since being back on the amlodipine.  She actually has 10 mg and has been splitting them.  We will go ahead and send over new prescription for amlodipine 5 mg to pharmacy.

## 2020-01-02 NOTE — Assessment & Plan Note (Signed)
Tolerating statin well without any side effects or problems.

## 2020-01-02 NOTE — Assessment & Plan Note (Addendum)
Controlled.  Plan to recheck level again in 6 months.  Reminded her to dispose of all her old thyroid medications.

## 2020-01-18 DIAGNOSIS — G40209 Localization-related (focal) (partial) symptomatic epilepsy and epileptic syndromes with complex partial seizures, not intractable, without status epilepticus: Secondary | ICD-10-CM | POA: Diagnosis not present

## 2020-01-18 DIAGNOSIS — M545 Low back pain: Secondary | ICD-10-CM | POA: Diagnosis not present

## 2020-01-18 DIAGNOSIS — G3184 Mild cognitive impairment, so stated: Secondary | ICD-10-CM | POA: Diagnosis not present

## 2020-01-18 DIAGNOSIS — M5417 Radiculopathy, lumbosacral region: Secondary | ICD-10-CM | POA: Diagnosis not present

## 2020-01-31 DIAGNOSIS — G459 Transient cerebral ischemic attack, unspecified: Secondary | ICD-10-CM | POA: Diagnosis not present

## 2020-01-31 DIAGNOSIS — I455 Other specified heart block: Secondary | ICD-10-CM | POA: Diagnosis not present

## 2020-01-31 DIAGNOSIS — I480 Paroxysmal atrial fibrillation: Secondary | ICD-10-CM | POA: Diagnosis not present

## 2020-01-31 DIAGNOSIS — I10 Essential (primary) hypertension: Secondary | ICD-10-CM | POA: Diagnosis not present

## 2020-01-31 DIAGNOSIS — I495 Sick sinus syndrome: Secondary | ICD-10-CM | POA: Diagnosis not present

## 2020-01-31 DIAGNOSIS — I4891 Unspecified atrial fibrillation: Secondary | ICD-10-CM | POA: Diagnosis not present

## 2020-02-20 ENCOUNTER — Other Ambulatory Visit: Payer: Self-pay

## 2020-02-20 DIAGNOSIS — F411 Generalized anxiety disorder: Secondary | ICD-10-CM

## 2020-02-20 MED ORDER — ALPRAZOLAM 1 MG PO TABS: ORAL_TABLET | ORAL | 1 refills | Status: DC

## 2020-02-20 NOTE — Telephone Encounter (Signed)
Routing to PCP

## 2020-04-02 ENCOUNTER — Ambulatory Visit (INDEPENDENT_AMBULATORY_CARE_PROVIDER_SITE_OTHER): Payer: Medicare HMO | Admitting: Family Medicine

## 2020-04-02 ENCOUNTER — Encounter: Payer: Self-pay | Admitting: Family Medicine

## 2020-04-02 ENCOUNTER — Other Ambulatory Visit: Payer: Self-pay

## 2020-04-02 VITALS — BP 139/67 | HR 72 | Ht 60.0 in | Wt 120.0 lb

## 2020-04-02 DIAGNOSIS — L570 Actinic keratosis: Secondary | ICD-10-CM | POA: Diagnosis not present

## 2020-04-02 DIAGNOSIS — I10 Essential (primary) hypertension: Secondary | ICD-10-CM | POA: Diagnosis not present

## 2020-04-02 NOTE — Assessment & Plan Note (Signed)
Cryotherapy performed.  Patient tolerated well.

## 2020-04-02 NOTE — Assessment & Plan Note (Addendum)
Home blood pressures look fantastic she brought in her log see scanned document continue current regimen I will see her back in 3 to 4 months.  She is technically due for blood work next month.  Went ahead and order BMP today.  I had tried to encourage her to decrease how often she is checking her blood pressure but says that she is worried that it will go high and she will know it because she does not always feel bad.  But we discussed that sometimes when it does go a little high it actually makes her more anxious and then makes her blood pressure actually go even higher so would really rather her reduce the frequency of her checking to at least down to 3 times a day between now and when I see her back.

## 2020-04-02 NOTE — Progress Notes (Addendum)
Established Patient Office Visit  Subjective:  Patient ID: Tina Graham, female    DOB: 05/29/1929  Age: 86 y.o. MRN: [id number]  CC:  Chief Complaint  Patient presents with  . Hypertension    HPI ODESSIA ASLESON presents for   Hypertension- Pt denies chest pain, SOB, dizziness, or heart palpitations.  Taking meds as directed w/o problems.  Denies medication side effects.  She brought in her home blood pressure log most of her pressures look absolutely phenomenal she has a few in the 140s or low 150s but they are infrequent.  She is tolerating her regimen well.  Unfortunately though she continues to check her blood pressure 4 times a day.  Previously performed cryotherapy on several AK's on her scalp.  She says several of them have resolved but she still has a few crusty areas that she would like treated again today.  Also wanted to let me know that she bought some type of new supplement she started it on June 19 and says that it is good for "lots of things" she says that she has been feeling better since taking it.  She says she is even noticed new hair growth.  Past Medical History:  Diagnosis Date  . H. pylori infection    treated  . HOH (hard of hearing)   . Hyperlipidemia   . Hypertension   . Hypothyroidism     Past Surgical History:  Procedure Laterality Date  . APPENDECTOMY    . CHOLECYSTECTOMY    . THYROIDECTOMY     partial for goiter    Family History  Problem Relation Age of Onset  . Cancer Daughter   . Heart disease Sister        unknown specifics    Social History   Socioeconomic History  . Marital status: Widowed    Spouse name: Not on file  . Number of children: 5  . Years of education: 6  . Highest education level: GED or equivalent  Occupational History    Employer: RETIRED  Tobacco Use  . Smoking status: Former Games developer  . Smokeless tobacco: Never Used  Vaping Use  . Vaping Use: Never used  Substance and Sexual Activity  . Alcohol use:  No  . Drug use: No  . Sexual activity: Not Currently    Comment: housewife, widowed, lives alone, regular exercise  Other Topics Concern  . Not on file  Social History Narrative   Has back problems and sleeps a lot due to this- back pain   Social Determinants of Health   Financial Resource Strain:   . Difficulty of Paying Living Expenses: Not on file  Food Insecurity:   . Worried About Programme researcher, broadcasting/film/video in the Last Year: Not on file  . Ran Out of Food in the Last Year: Not on file  Transportation Needs:   . Lack of Transportation (Medical): Not on file  . Lack of Transportation (Non-Medical): Not on file  Physical Activity:   . Days of Exercise per Week: Not on file  . Minutes of Exercise per Session: Not on file  Stress:   . Feeling of Stress : Not on file  Social Connections:   . Frequency of Communication with Friends and Family: Not on file  . Frequency of Social Gatherings with Friends and Family: Not on file  . Attends Religious Services: Not on file  . Active Member of Clubs or Organizations: Not on file  . Attends Banker  Meetings: Not on file  . Marital Status: Not on file  Intimate Partner Violence:   . Fear of Current or Ex-Partner: Not on file  . Emotionally Abused: Not on file  . Physically Abused: Not on file  . Sexually Abused: Not on file    Outpatient Medications Prior to Visit  Medication Sig Dispense Refill  . ALPRAZolam (XANAX) 1 MG tablet TAKE 1 TABLET AT BEDTIME AS NEEDED FOR ANXIETY. 30 tablet 1  . amLODipine (NORVASC) 5 MG tablet Take 1 tablet (5 mg total) by mouth daily. 90 tablet 1  . aspirin EC 81 MG tablet Take 81 mg by mouth daily.    Marland Kitchen atorvastatin (LIPITOR) 40 MG tablet TAKE 1 TABLET EVERY DAY 90 tablet 3  . Bioflavonoid Products (BIOFLEX PO) Take by mouth.    . carvedilol (COREG) 25 MG tablet     . lamoTRIgine (LAMICTAL) 25 MG tablet Take 50 mg by mouth 2 (two) times daily.     Marland Kitchen levothyroxine (SYNTHROID) 100 MCG tablet  Take 1 tablet (100 mcg total) by mouth daily. 90 tablet 3  . lisinopril (ZESTRIL) 20 MG tablet Take 20 mg by mouth daily.    . Multiple Vitamins-Minerals (PRESERVISION AREDS 2 PO) Take by mouth.    . Omega-3 Fatty Acids (FISH OIL) 1000 MG CAPS Take 1,000 mg by mouth daily.     . hydrochlorothiazide (HYDRODIURIL) 12.5 MG tablet     . triamcinolone cream (KENALOG) 0.1 % Apply 1 application topically 2 (two) times daily. 30 g 0   No facility-administered medications prior to visit.    Allergies  Allergen Reactions  . Donepezil Other (See Comments)    nightmares  . Ibuprofen Other (See Comments)    Pt unable to recall reaction Patient is not sure what happens if she takes this medicine.  . Memantine Other (See Comments)    Nightmares   . Simvastatin Other (See Comments)    REACTION: myalgias    ROS Review of Systems    Objective:    Physical Exam Constitutional:      Appearance: She is well-developed.  HENT:     Head: Normocephalic and atraumatic.  Cardiovascular:     Rate and Rhythm: Normal rate and regular rhythm.     Heart sounds: Normal heart sounds.  Pulmonary:     Effort: Pulmonary effort is normal.     Breath sounds: Normal breath sounds.  Skin:    General: Skin is warm and dry.  Neurological:     Mental Status: She is alert and oriented to person, place, and time.  Psychiatric:        Behavior: Behavior normal.     BP 139/67   Pulse 72   Ht 5' (1.524 m)   Wt 120 lb (54.4 kg)   SpO2 100%   BMI 23.44 kg/m  Wt Readings from Last 3 Encounters:  04/02/20 120 lb (54.4 kg)  01/02/20 118 lb (53.5 kg)  12/07/19 117 lb (53.1 kg)     There are no preventive care reminders to display for this patient.  There are no preventive care reminders to display for this patient.  Lab Results  Component Value Date   TSH 1.98 12/07/2019   Lab Results  Component Value Date   WBC 8.6 10/02/2019   HGB 13.2 10/02/2019   HCT 40.4 10/02/2019   MCV 91.2 10/02/2019    PLT 303 10/02/2019   Lab Results  Component Value Date   NA 139 10/02/2019  K 4.1 10/02/2019   CO2 28 10/02/2019   GLUCOSE 107 (H) 10/02/2019   BUN 16 10/02/2019   CREATININE 0.76 10/02/2019   BILITOT 1.0 10/02/2019   ALKPHOS 98 11/05/2016   AST 17 10/02/2019   ALT 14 10/02/2019   PROT 6.4 10/02/2019   ALBUMIN 4.0 11/05/2016   CALCIUM 9.8 10/02/2019   Lab Results  Component Value Date   CHOL 114 10/02/2019   Lab Results  Component Value Date   HDL 51 10/02/2019   Lab Results  Component Value Date   LDLCALC 47 10/02/2019   Lab Results  Component Value Date   TRIG 76 10/02/2019   Lab Results  Component Value Date   CHOLHDL 2.2 10/02/2019   Lab Results  Component Value Date   HGBA1C 5.2 10/02/2019      Assessment & Plan:   Problem List Items Addressed This Visit      Cardiovascular and Mediastinum   HYPERTENSION, BENIGN - Primary    Home blood pressures look fantastic she brought in her log see scanned document continue current regimen I will see her back in 3 to 4 months.  She is technically due for blood work next month.  Went ahead and order BMP today.  I had tried to encourage her to decrease how often she is checking her blood pressure but says that she is worried that it will go high and she will know it because she does not always feel bad.  But we discussed that sometimes when it does go a little high it actually makes her more anxious and then makes her blood pressure actually go even higher so would really rather her reduce the frequency of her checking to at least down to 3 times a day between now and when I see her back.      Relevant Orders   BASIC METABOLIC PANEL WITH GFR     Musculoskeletal and Integument   Actinic keratoses    Cryotherapy performed.  Patient tolerated well.         Asked her to bring in her new supplement with her at her next office visit so that we can see exactly what the active ingredients are and make sure that we have  this documented in the chart.  She also reports that she started an over-the-counter B12 supplement as well  Cryotherapy Procedure Note  Pre-operative Diagnosis: Actinic keratosis  Post-operative Diagnosis: Actinic keratosis  Locations: scalp at part in hairline to the left side of he scalp x 5  Indications: Precancerous lesions.   Anesthesia: not required    Procedure Details  Patient informed of risks (permanent scarring, infection, light or dark discoloration, bleeding, infection, weakness, numbness and recurrence of the lesion) and benefits of the procedure and verbal informed consent obtained.  The areas are treated with liquid nitrogen therapy, frozen until ice ball extended 1-2 mm beyond lesion, allowed to thaw, and treated again. The patient tolerated procedure well.  The patient was instructed on post-op care, warned that there may be blister formation, redness and pain. Recommend OTC analgesia as needed for pain.  Condition: Stable  Complications: none.  Plan: 1. Instructed to keep the area dry and covered for 24-48h and clean thereafter. 2. Warning signs of infection were reviewed.   3. Recommended that the patient use OTC acetaminophen as needed for pain.  4. Return PRN    No orders of the defined types were placed in this encounter.   Follow-up: Return in about 4  months (around 08/02/2020) for Hypertension.    Nani Gasser, MD

## 2020-04-02 NOTE — Patient Instructions (Signed)
Please only check your blood pressure 3 times a day instead of 4.  Okay to check first thing in the morning, around noon and then around 7 in the evening before your son calls you each day.

## 2020-04-12 DIAGNOSIS — I4891 Unspecified atrial fibrillation: Secondary | ICD-10-CM | POA: Diagnosis not present

## 2020-05-06 ENCOUNTER — Other Ambulatory Visit: Payer: Self-pay | Admitting: Family Medicine

## 2020-05-18 ENCOUNTER — Other Ambulatory Visit: Payer: Self-pay | Admitting: Family Medicine

## 2020-05-18 DIAGNOSIS — I10 Essential (primary) hypertension: Secondary | ICD-10-CM

## 2020-05-23 DIAGNOSIS — G5603 Carpal tunnel syndrome, bilateral upper limbs: Secondary | ICD-10-CM | POA: Diagnosis not present

## 2020-05-23 DIAGNOSIS — R531 Weakness: Secondary | ICD-10-CM | POA: Diagnosis not present

## 2020-05-23 DIAGNOSIS — G3184 Mild cognitive impairment, so stated: Secondary | ICD-10-CM | POA: Diagnosis not present

## 2020-05-23 DIAGNOSIS — G603 Idiopathic progressive neuropathy: Secondary | ICD-10-CM | POA: Diagnosis not present

## 2020-05-23 DIAGNOSIS — R202 Paresthesia of skin: Secondary | ICD-10-CM | POA: Diagnosis not present

## 2020-05-23 DIAGNOSIS — M5417 Radiculopathy, lumbosacral region: Secondary | ICD-10-CM | POA: Diagnosis not present

## 2020-05-23 DIAGNOSIS — M5412 Radiculopathy, cervical region: Secondary | ICD-10-CM | POA: Diagnosis not present

## 2020-05-23 DIAGNOSIS — G5623 Lesion of ulnar nerve, bilateral upper limbs: Secondary | ICD-10-CM | POA: Diagnosis not present

## 2020-06-07 ENCOUNTER — Other Ambulatory Visit: Payer: Self-pay | Admitting: *Deleted

## 2020-06-07 DIAGNOSIS — F411 Generalized anxiety disorder: Secondary | ICD-10-CM

## 2020-06-07 MED ORDER — ALPRAZOLAM 1 MG PO TABS: ORAL_TABLET | ORAL | 1 refills | Status: DC
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
## 2020-07-08 DIAGNOSIS — R279 Unspecified lack of coordination: Secondary | ICD-10-CM | POA: Diagnosis not present

## 2020-07-08 DIAGNOSIS — N390 Urinary tract infection, site not specified: Secondary | ICD-10-CM | POA: Diagnosis not present

## 2020-07-08 DIAGNOSIS — N179 Acute kidney failure, unspecified: Secondary | ICD-10-CM | POA: Diagnosis not present

## 2020-07-08 DIAGNOSIS — Z743 Need for continuous supervision: Secondary | ICD-10-CM | POA: Diagnosis not present

## 2020-07-08 DIAGNOSIS — I4891 Unspecified atrial fibrillation: Secondary | ICD-10-CM | POA: Diagnosis not present

## 2020-07-08 DIAGNOSIS — G8911 Acute pain due to trauma: Secondary | ICD-10-CM | POA: Diagnosis not present

## 2020-07-08 DIAGNOSIS — W19XXXA Unspecified fall, initial encounter: Secondary | ICD-10-CM | POA: Diagnosis not present

## 2020-07-08 DIAGNOSIS — I082 Rheumatic disorders of both aortic and tricuspid valves: Secondary | ICD-10-CM | POA: Diagnosis not present

## 2020-07-08 DIAGNOSIS — R531 Weakness: Secondary | ICD-10-CM | POA: Diagnosis not present

## 2020-07-08 DIAGNOSIS — T796XXA Traumatic ischemia of muscle, initial encounter: Secondary | ICD-10-CM | POA: Diagnosis not present

## 2020-07-08 DIAGNOSIS — I10 Essential (primary) hypertension: Secondary | ICD-10-CM | POA: Diagnosis not present

## 2020-07-08 DIAGNOSIS — E875 Hyperkalemia: Secondary | ICD-10-CM | POA: Diagnosis not present

## 2020-07-08 DIAGNOSIS — F418 Other specified anxiety disorders: Secondary | ICD-10-CM | POA: Diagnosis not present

## 2020-07-08 DIAGNOSIS — S72141A Displaced intertrochanteric fracture of right femur, initial encounter for closed fracture: Secondary | ICD-10-CM | POA: Diagnosis not present

## 2020-07-08 DIAGNOSIS — M6282 Rhabdomyolysis: Secondary | ICD-10-CM | POA: Diagnosis not present

## 2020-07-08 DIAGNOSIS — E039 Hypothyroidism, unspecified: Secondary | ICD-10-CM | POA: Diagnosis not present

## 2020-07-08 DIAGNOSIS — Z8673 Personal history of transient ischemic attack (TIA), and cerebral infarction without residual deficits: Secondary | ICD-10-CM | POA: Diagnosis not present

## 2020-07-08 DIAGNOSIS — S72101A Unspecified trochanteric fracture of right femur, initial encounter for closed fracture: Secondary | ICD-10-CM | POA: Diagnosis not present

## 2020-07-08 DIAGNOSIS — R262 Difficulty in walking, not elsewhere classified: Secondary | ICD-10-CM | POA: Diagnosis not present

## 2020-07-08 DIAGNOSIS — D649 Anemia, unspecified: Secondary | ICD-10-CM | POA: Diagnosis not present

## 2020-07-08 DIAGNOSIS — E785 Hyperlipidemia, unspecified: Secondary | ICD-10-CM | POA: Diagnosis not present

## 2020-07-08 DIAGNOSIS — S72091A Other fracture of head and neck of right femur, initial encounter for closed fracture: Secondary | ICD-10-CM | POA: Diagnosis not present

## 2020-07-08 DIAGNOSIS — S72001A Fracture of unspecified part of neck of right femur, initial encounter for closed fracture: Secondary | ICD-10-CM | POA: Diagnosis not present

## 2020-07-08 DIAGNOSIS — I959 Hypotension, unspecified: Secondary | ICD-10-CM | POA: Diagnosis not present

## 2020-07-08 DIAGNOSIS — D62 Acute posthemorrhagic anemia: Secondary | ICD-10-CM | POA: Diagnosis not present

## 2020-07-08 DIAGNOSIS — Z87891 Personal history of nicotine dependence: Secondary | ICD-10-CM | POA: Diagnosis not present

## 2020-07-08 DIAGNOSIS — T796XXD Traumatic ischemia of muscle, subsequent encounter: Secondary | ICD-10-CM | POA: Diagnosis not present

## 2020-07-08 DIAGNOSIS — I480 Paroxysmal atrial fibrillation: Secondary | ICD-10-CM | POA: Diagnosis not present

## 2020-07-08 DIAGNOSIS — R0902 Hypoxemia: Secondary | ICD-10-CM | POA: Diagnosis not present

## 2020-07-08 DIAGNOSIS — S72141D Displaced intertrochanteric fracture of right femur, subsequent encounter for closed fracture with routine healing: Secondary | ICD-10-CM | POA: Diagnosis not present

## 2020-07-08 DIAGNOSIS — R2681 Unsteadiness on feet: Secondary | ICD-10-CM | POA: Diagnosis not present

## 2020-07-08 DIAGNOSIS — S0990XA Unspecified injury of head, initial encounter: Secondary | ICD-10-CM | POA: Diagnosis not present

## 2020-07-08 DIAGNOSIS — K219 Gastro-esophageal reflux disease without esophagitis: Secondary | ICD-10-CM | POA: Diagnosis not present

## 2020-07-08 DIAGNOSIS — R52 Pain, unspecified: Secondary | ICD-10-CM | POA: Diagnosis not present

## 2020-07-08 DIAGNOSIS — S7291XA Unspecified fracture of right femur, initial encounter for closed fracture: Secondary | ICD-10-CM | POA: Diagnosis not present

## 2020-07-08 DIAGNOSIS — Z950 Presence of cardiac pacemaker: Secondary | ICD-10-CM | POA: Diagnosis not present

## 2020-07-10 DIAGNOSIS — I082 Rheumatic disorders of both aortic and tricuspid valves: Secondary | ICD-10-CM | POA: Diagnosis not present

## 2020-07-15 DIAGNOSIS — Z8781 Personal history of (healed) traumatic fracture: Secondary | ICD-10-CM | POA: Insufficient documentation

## 2020-07-22 DIAGNOSIS — K219 Gastro-esophageal reflux disease without esophagitis: Secondary | ICD-10-CM | POA: Diagnosis not present

## 2020-07-22 DIAGNOSIS — S72141D Displaced intertrochanteric fracture of right femur, subsequent encounter for closed fracture with routine healing: Secondary | ICD-10-CM | POA: Diagnosis not present

## 2020-07-22 DIAGNOSIS — R262 Difficulty in walking, not elsewhere classified: Secondary | ICD-10-CM | POA: Diagnosis not present

## 2020-07-22 DIAGNOSIS — R2681 Unsteadiness on feet: Secondary | ICD-10-CM | POA: Diagnosis not present

## 2020-07-22 DIAGNOSIS — I480 Paroxysmal atrial fibrillation: Secondary | ICD-10-CM | POA: Diagnosis not present

## 2020-07-22 DIAGNOSIS — E039 Hypothyroidism, unspecified: Secondary | ICD-10-CM | POA: Diagnosis not present

## 2020-07-22 DIAGNOSIS — F418 Other specified anxiety disorders: Secondary | ICD-10-CM | POA: Diagnosis not present

## 2020-07-22 DIAGNOSIS — D62 Acute posthemorrhagic anemia: Secondary | ICD-10-CM | POA: Diagnosis not present

## 2020-07-22 DIAGNOSIS — T796XXA Traumatic ischemia of muscle, initial encounter: Secondary | ICD-10-CM | POA: Diagnosis not present

## 2020-07-22 DIAGNOSIS — I4891 Unspecified atrial fibrillation: Secondary | ICD-10-CM | POA: Diagnosis not present

## 2020-07-22 DIAGNOSIS — I10 Essential (primary) hypertension: Secondary | ICD-10-CM | POA: Diagnosis not present

## 2020-07-22 DIAGNOSIS — R279 Unspecified lack of coordination: Secondary | ICD-10-CM | POA: Diagnosis not present

## 2020-07-22 DIAGNOSIS — Z8781 Personal history of (healed) traumatic fracture: Secondary | ICD-10-CM | POA: Diagnosis not present

## 2020-07-22 DIAGNOSIS — T796XXD Traumatic ischemia of muscle, subsequent encounter: Secondary | ICD-10-CM | POA: Diagnosis not present

## 2020-07-22 DIAGNOSIS — I495 Sick sinus syndrome: Secondary | ICD-10-CM | POA: Diagnosis not present

## 2020-07-22 DIAGNOSIS — D649 Anemia, unspecified: Secondary | ICD-10-CM | POA: Diagnosis not present

## 2020-07-22 DIAGNOSIS — Z743 Need for continuous supervision: Secondary | ICD-10-CM | POA: Diagnosis not present

## 2020-07-22 DIAGNOSIS — R52 Pain, unspecified: Secondary | ICD-10-CM | POA: Diagnosis not present

## 2020-07-22 DIAGNOSIS — Z950 Presence of cardiac pacemaker: Secondary | ICD-10-CM | POA: Diagnosis not present

## 2020-07-22 DIAGNOSIS — S72142D Displaced intertrochanteric fracture of left femur, subsequent encounter for closed fracture with routine healing: Secondary | ICD-10-CM | POA: Diagnosis not present

## 2020-07-22 DIAGNOSIS — K5901 Slow transit constipation: Secondary | ICD-10-CM | POA: Diagnosis not present

## 2020-07-22 DIAGNOSIS — E785 Hyperlipidemia, unspecified: Secondary | ICD-10-CM | POA: Diagnosis not present

## 2020-07-22 DIAGNOSIS — D500 Iron deficiency anemia secondary to blood loss (chronic): Secondary | ICD-10-CM | POA: Diagnosis not present

## 2020-07-22 DIAGNOSIS — S72001A Fracture of unspecified part of neck of right femur, initial encounter for closed fracture: Secondary | ICD-10-CM | POA: Diagnosis not present

## 2020-07-22 DIAGNOSIS — I959 Hypotension, unspecified: Secondary | ICD-10-CM | POA: Diagnosis not present

## 2020-07-22 DIAGNOSIS — Z9889 Other specified postprocedural states: Secondary | ICD-10-CM | POA: Diagnosis not present

## 2020-07-22 DIAGNOSIS — S72001D Fracture of unspecified part of neck of right femur, subsequent encounter for closed fracture with routine healing: Secondary | ICD-10-CM | POA: Diagnosis not present

## 2020-07-23 ENCOUNTER — Other Ambulatory Visit: Payer: Self-pay

## 2020-07-23 DIAGNOSIS — I495 Sick sinus syndrome: Secondary | ICD-10-CM | POA: Diagnosis not present

## 2020-07-23 DIAGNOSIS — I10 Essential (primary) hypertension: Secondary | ICD-10-CM | POA: Diagnosis not present

## 2020-07-23 DIAGNOSIS — E785 Hyperlipidemia, unspecified: Secondary | ICD-10-CM | POA: Diagnosis not present

## 2020-07-23 DIAGNOSIS — E039 Hypothyroidism, unspecified: Secondary | ICD-10-CM | POA: Diagnosis not present

## 2020-07-23 DIAGNOSIS — K219 Gastro-esophageal reflux disease without esophagitis: Secondary | ICD-10-CM | POA: Diagnosis not present

## 2020-07-23 DIAGNOSIS — Z9889 Other specified postprocedural states: Secondary | ICD-10-CM | POA: Diagnosis not present

## 2020-07-23 DIAGNOSIS — I480 Paroxysmal atrial fibrillation: Secondary | ICD-10-CM | POA: Diagnosis not present

## 2020-07-23 DIAGNOSIS — D500 Iron deficiency anemia secondary to blood loss (chronic): Secondary | ICD-10-CM | POA: Diagnosis not present

## 2020-07-23 DIAGNOSIS — S72001D Fracture of unspecified part of neck of right femur, subsequent encounter for closed fracture with routine healing: Secondary | ICD-10-CM | POA: Diagnosis not present

## 2020-07-23 NOTE — Patient Outreach (Signed)
[facility]) Care Management  07/23/2020  Tina Graham Dec 11, 1928 [id number]   Referral Source: [employer] Report Date of Discharge: 07/22/20 Facility:  [facility] Insurance: [facility]   Referral received.  No outreach warranted at this time.  Patient discharged to SNF at this time.   Plan: RN CM will close case.    Bary Leriche, RN, MSN [facility] Care Management Care Management Coordinator Direct Line [phone] Toll Free: [phone]  Fax: [phone]

## 2020-08-01 ENCOUNTER — Ambulatory Visit: Payer: Medicare HMO | Admitting: Family Medicine

## 2020-08-01 DIAGNOSIS — K5901 Slow transit constipation: Secondary | ICD-10-CM | POA: Diagnosis not present

## 2020-08-01 DIAGNOSIS — I10 Essential (primary) hypertension: Secondary | ICD-10-CM | POA: Diagnosis not present

## 2020-08-01 DIAGNOSIS — Z8781 Personal history of (healed) traumatic fracture: Secondary | ICD-10-CM | POA: Diagnosis not present

## 2020-08-01 DIAGNOSIS — Z9889 Other specified postprocedural states: Secondary | ICD-10-CM | POA: Diagnosis not present

## 2020-08-01 DIAGNOSIS — S72001D Fracture of unspecified part of neck of right femur, subsequent encounter for closed fracture with routine healing: Secondary | ICD-10-CM | POA: Diagnosis not present

## 2020-08-01 DIAGNOSIS — I480 Paroxysmal atrial fibrillation: Secondary | ICD-10-CM | POA: Diagnosis not present

## 2020-08-01 DIAGNOSIS — D500 Iron deficiency anemia secondary to blood loss (chronic): Secondary | ICD-10-CM | POA: Diagnosis not present

## 2020-08-05 DIAGNOSIS — D500 Iron deficiency anemia secondary to blood loss (chronic): Secondary | ICD-10-CM | POA: Diagnosis not present

## 2020-08-05 DIAGNOSIS — S72001D Fracture of unspecified part of neck of right femur, subsequent encounter for closed fracture with routine healing: Secondary | ICD-10-CM | POA: Diagnosis not present

## 2020-08-05 DIAGNOSIS — Z9889 Other specified postprocedural states: Secondary | ICD-10-CM | POA: Diagnosis not present

## 2020-08-05 DIAGNOSIS — Z8781 Personal history of (healed) traumatic fracture: Secondary | ICD-10-CM | POA: Diagnosis not present

## 2020-08-05 DIAGNOSIS — K5901 Slow transit constipation: Secondary | ICD-10-CM | POA: Diagnosis not present

## 2020-08-05 DIAGNOSIS — I10 Essential (primary) hypertension: Secondary | ICD-10-CM | POA: Diagnosis not present

## 2020-08-07 DIAGNOSIS — R52 Pain, unspecified: Secondary | ICD-10-CM | POA: Diagnosis not present

## 2020-08-08 DIAGNOSIS — Z9889 Other specified postprocedural states: Secondary | ICD-10-CM | POA: Diagnosis not present

## 2020-08-08 DIAGNOSIS — K5901 Slow transit constipation: Secondary | ICD-10-CM | POA: Diagnosis not present

## 2020-08-08 DIAGNOSIS — D500 Iron deficiency anemia secondary to blood loss (chronic): Secondary | ICD-10-CM | POA: Diagnosis not present

## 2020-08-08 DIAGNOSIS — Z8781 Personal history of (healed) traumatic fracture: Secondary | ICD-10-CM | POA: Diagnosis not present

## 2020-08-08 DIAGNOSIS — S72142D Displaced intertrochanteric fracture of left femur, subsequent encounter for closed fracture with routine healing: Secondary | ICD-10-CM | POA: Diagnosis not present

## 2020-08-08 DIAGNOSIS — I480 Paroxysmal atrial fibrillation: Secondary | ICD-10-CM | POA: Diagnosis not present

## 2020-08-08 DIAGNOSIS — I10 Essential (primary) hypertension: Secondary | ICD-10-CM | POA: Diagnosis not present

## 2020-08-12 DIAGNOSIS — K5901 Slow transit constipation: Secondary | ICD-10-CM | POA: Diagnosis not present

## 2020-08-12 DIAGNOSIS — S72001D Fracture of unspecified part of neck of right femur, subsequent encounter for closed fracture with routine healing: Secondary | ICD-10-CM | POA: Diagnosis not present

## 2020-08-12 DIAGNOSIS — Z9889 Other specified postprocedural states: Secondary | ICD-10-CM | POA: Diagnosis not present

## 2020-08-12 DIAGNOSIS — I480 Paroxysmal atrial fibrillation: Secondary | ICD-10-CM | POA: Diagnosis not present

## 2020-08-12 DIAGNOSIS — D500 Iron deficiency anemia secondary to blood loss (chronic): Secondary | ICD-10-CM | POA: Diagnosis not present

## 2020-08-12 DIAGNOSIS — I10 Essential (primary) hypertension: Secondary | ICD-10-CM | POA: Diagnosis not present

## 2020-08-15 DIAGNOSIS — D500 Iron deficiency anemia secondary to blood loss (chronic): Secondary | ICD-10-CM | POA: Diagnosis not present

## 2020-08-15 DIAGNOSIS — Z9889 Other specified postprocedural states: Secondary | ICD-10-CM | POA: Diagnosis not present

## 2020-08-15 DIAGNOSIS — Z8781 Personal history of (healed) traumatic fracture: Secondary | ICD-10-CM | POA: Diagnosis not present

## 2020-08-15 DIAGNOSIS — K5901 Slow transit constipation: Secondary | ICD-10-CM | POA: Diagnosis not present

## 2020-08-15 DIAGNOSIS — I10 Essential (primary) hypertension: Secondary | ICD-10-CM | POA: Diagnosis not present

## 2020-08-15 DIAGNOSIS — I480 Paroxysmal atrial fibrillation: Secondary | ICD-10-CM | POA: Diagnosis not present

## 2020-08-16 DIAGNOSIS — E039 Hypothyroidism, unspecified: Secondary | ICD-10-CM | POA: Diagnosis not present

## 2020-08-16 DIAGNOSIS — K219 Gastro-esophageal reflux disease without esophagitis: Secondary | ICD-10-CM | POA: Diagnosis not present

## 2020-08-16 DIAGNOSIS — Z9181 History of falling: Secondary | ICD-10-CM | POA: Diagnosis not present

## 2020-08-16 DIAGNOSIS — E785 Hyperlipidemia, unspecified: Secondary | ICD-10-CM | POA: Diagnosis not present

## 2020-08-16 DIAGNOSIS — S72141D Displaced intertrochanteric fracture of right femur, subsequent encounter for closed fracture with routine healing: Secondary | ICD-10-CM | POA: Diagnosis not present

## 2020-08-16 DIAGNOSIS — I480 Paroxysmal atrial fibrillation: Secondary | ICD-10-CM | POA: Diagnosis not present

## 2020-08-16 DIAGNOSIS — I10 Essential (primary) hypertension: Secondary | ICD-10-CM | POA: Diagnosis not present

## 2020-08-16 DIAGNOSIS — D649 Anemia, unspecified: Secondary | ICD-10-CM | POA: Diagnosis not present

## 2020-08-17 DIAGNOSIS — Z9181 History of falling: Secondary | ICD-10-CM | POA: Diagnosis not present

## 2020-08-17 DIAGNOSIS — S72141D Displaced intertrochanteric fracture of right femur, subsequent encounter for closed fracture with routine healing: Secondary | ICD-10-CM | POA: Diagnosis not present

## 2020-08-17 DIAGNOSIS — D649 Anemia, unspecified: Secondary | ICD-10-CM | POA: Diagnosis not present

## 2020-08-17 DIAGNOSIS — E785 Hyperlipidemia, unspecified: Secondary | ICD-10-CM | POA: Diagnosis not present

## 2020-08-17 DIAGNOSIS — I480 Paroxysmal atrial fibrillation: Secondary | ICD-10-CM | POA: Diagnosis not present

## 2020-08-17 DIAGNOSIS — I10 Essential (primary) hypertension: Secondary | ICD-10-CM | POA: Diagnosis not present

## 2020-08-17 DIAGNOSIS — K219 Gastro-esophageal reflux disease without esophagitis: Secondary | ICD-10-CM | POA: Diagnosis not present

## 2020-08-17 DIAGNOSIS — E039 Hypothyroidism, unspecified: Secondary | ICD-10-CM | POA: Diagnosis not present

## 2020-08-21 DIAGNOSIS — I10 Essential (primary) hypertension: Secondary | ICD-10-CM | POA: Diagnosis not present

## 2020-08-21 DIAGNOSIS — Z9181 History of falling: Secondary | ICD-10-CM | POA: Diagnosis not present

## 2020-08-21 DIAGNOSIS — E039 Hypothyroidism, unspecified: Secondary | ICD-10-CM | POA: Diagnosis not present

## 2020-08-21 DIAGNOSIS — K219 Gastro-esophageal reflux disease without esophagitis: Secondary | ICD-10-CM | POA: Diagnosis not present

## 2020-08-21 DIAGNOSIS — D649 Anemia, unspecified: Secondary | ICD-10-CM | POA: Diagnosis not present

## 2020-08-21 DIAGNOSIS — I480 Paroxysmal atrial fibrillation: Secondary | ICD-10-CM | POA: Diagnosis not present

## 2020-08-21 DIAGNOSIS — E785 Hyperlipidemia, unspecified: Secondary | ICD-10-CM | POA: Diagnosis not present

## 2020-08-21 DIAGNOSIS — S72141D Displaced intertrochanteric fracture of right femur, subsequent encounter for closed fracture with routine healing: Secondary | ICD-10-CM | POA: Diagnosis not present

## 2020-08-22 DIAGNOSIS — Z9181 History of falling: Secondary | ICD-10-CM | POA: Diagnosis not present

## 2020-08-22 DIAGNOSIS — I480 Paroxysmal atrial fibrillation: Secondary | ICD-10-CM | POA: Diagnosis not present

## 2020-08-22 DIAGNOSIS — D649 Anemia, unspecified: Secondary | ICD-10-CM | POA: Diagnosis not present

## 2020-08-22 DIAGNOSIS — S72141D Displaced intertrochanteric fracture of right femur, subsequent encounter for closed fracture with routine healing: Secondary | ICD-10-CM | POA: Diagnosis not present

## 2020-08-22 DIAGNOSIS — E785 Hyperlipidemia, unspecified: Secondary | ICD-10-CM | POA: Diagnosis not present

## 2020-08-22 DIAGNOSIS — K219 Gastro-esophageal reflux disease without esophagitis: Secondary | ICD-10-CM | POA: Diagnosis not present

## 2020-08-22 DIAGNOSIS — E039 Hypothyroidism, unspecified: Secondary | ICD-10-CM | POA: Diagnosis not present

## 2020-08-22 DIAGNOSIS — I10 Essential (primary) hypertension: Secondary | ICD-10-CM | POA: Diagnosis not present

## 2020-08-23 DIAGNOSIS — D649 Anemia, unspecified: Secondary | ICD-10-CM | POA: Diagnosis not present

## 2020-08-23 DIAGNOSIS — I10 Essential (primary) hypertension: Secondary | ICD-10-CM | POA: Diagnosis not present

## 2020-08-23 DIAGNOSIS — E039 Hypothyroidism, unspecified: Secondary | ICD-10-CM | POA: Diagnosis not present

## 2020-08-23 DIAGNOSIS — Z9181 History of falling: Secondary | ICD-10-CM | POA: Diagnosis not present

## 2020-08-23 DIAGNOSIS — S72141D Displaced intertrochanteric fracture of right femur, subsequent encounter for closed fracture with routine healing: Secondary | ICD-10-CM | POA: Diagnosis not present

## 2020-08-23 DIAGNOSIS — I480 Paroxysmal atrial fibrillation: Secondary | ICD-10-CM | POA: Diagnosis not present

## 2020-08-23 DIAGNOSIS — K219 Gastro-esophageal reflux disease without esophagitis: Secondary | ICD-10-CM | POA: Diagnosis not present

## 2020-08-23 DIAGNOSIS — E785 Hyperlipidemia, unspecified: Secondary | ICD-10-CM | POA: Diagnosis not present

## 2020-08-26 DIAGNOSIS — D649 Anemia, unspecified: Secondary | ICD-10-CM | POA: Diagnosis not present

## 2020-08-26 DIAGNOSIS — S72141D Displaced intertrochanteric fracture of right femur, subsequent encounter for closed fracture with routine healing: Secondary | ICD-10-CM | POA: Diagnosis not present

## 2020-08-26 DIAGNOSIS — I480 Paroxysmal atrial fibrillation: Secondary | ICD-10-CM | POA: Diagnosis not present

## 2020-08-26 DIAGNOSIS — Z9181 History of falling: Secondary | ICD-10-CM | POA: Diagnosis not present

## 2020-08-26 DIAGNOSIS — I10 Essential (primary) hypertension: Secondary | ICD-10-CM | POA: Diagnosis not present

## 2020-08-26 DIAGNOSIS — E039 Hypothyroidism, unspecified: Secondary | ICD-10-CM | POA: Diagnosis not present

## 2020-08-26 DIAGNOSIS — K219 Gastro-esophageal reflux disease without esophagitis: Secondary | ICD-10-CM | POA: Diagnosis not present

## 2020-08-26 DIAGNOSIS — E785 Hyperlipidemia, unspecified: Secondary | ICD-10-CM | POA: Diagnosis not present

## 2020-08-29 DIAGNOSIS — D649 Anemia, unspecified: Secondary | ICD-10-CM | POA: Diagnosis not present

## 2020-08-29 DIAGNOSIS — Z9181 History of falling: Secondary | ICD-10-CM | POA: Diagnosis not present

## 2020-08-29 DIAGNOSIS — K219 Gastro-esophageal reflux disease without esophagitis: Secondary | ICD-10-CM | POA: Diagnosis not present

## 2020-08-29 DIAGNOSIS — E039 Hypothyroidism, unspecified: Secondary | ICD-10-CM | POA: Diagnosis not present

## 2020-08-29 DIAGNOSIS — E785 Hyperlipidemia, unspecified: Secondary | ICD-10-CM | POA: Diagnosis not present

## 2020-08-29 DIAGNOSIS — I10 Essential (primary) hypertension: Secondary | ICD-10-CM | POA: Diagnosis not present

## 2020-08-29 DIAGNOSIS — I480 Paroxysmal atrial fibrillation: Secondary | ICD-10-CM | POA: Diagnosis not present

## 2020-08-29 DIAGNOSIS — S72141D Displaced intertrochanteric fracture of right femur, subsequent encounter for closed fracture with routine healing: Secondary | ICD-10-CM | POA: Diagnosis not present

## 2020-09-02 DIAGNOSIS — E039 Hypothyroidism, unspecified: Secondary | ICD-10-CM | POA: Diagnosis not present

## 2020-09-02 DIAGNOSIS — I10 Essential (primary) hypertension: Secondary | ICD-10-CM | POA: Diagnosis not present

## 2020-09-02 DIAGNOSIS — K219 Gastro-esophageal reflux disease without esophagitis: Secondary | ICD-10-CM | POA: Diagnosis not present

## 2020-09-02 DIAGNOSIS — D649 Anemia, unspecified: Secondary | ICD-10-CM | POA: Diagnosis not present

## 2020-09-02 DIAGNOSIS — Z9181 History of falling: Secondary | ICD-10-CM | POA: Diagnosis not present

## 2020-09-02 DIAGNOSIS — E785 Hyperlipidemia, unspecified: Secondary | ICD-10-CM | POA: Diagnosis not present

## 2020-09-02 DIAGNOSIS — S72141D Displaced intertrochanteric fracture of right femur, subsequent encounter for closed fracture with routine healing: Secondary | ICD-10-CM | POA: Diagnosis not present

## 2020-09-02 DIAGNOSIS — I480 Paroxysmal atrial fibrillation: Secondary | ICD-10-CM | POA: Diagnosis not present

## 2020-09-04 DIAGNOSIS — M1611 Unilateral primary osteoarthritis, right hip: Secondary | ICD-10-CM | POA: Diagnosis not present

## 2020-09-04 DIAGNOSIS — S72101D Unspecified trochanteric fracture of right femur, subsequent encounter for closed fracture with routine healing: Secondary | ICD-10-CM | POA: Diagnosis not present

## 2020-09-05 ENCOUNTER — Other Ambulatory Visit: Payer: Self-pay

## 2020-09-05 ENCOUNTER — Ambulatory Visit (INDEPENDENT_AMBULATORY_CARE_PROVIDER_SITE_OTHER): Payer: Medicare HMO | Admitting: Family Medicine

## 2020-09-05 VITALS — BP 191/78 | HR 75 | Ht 60.0 in | Wt 116.0 lb

## 2020-09-05 DIAGNOSIS — I10 Essential (primary) hypertension: Secondary | ICD-10-CM | POA: Diagnosis not present

## 2020-09-05 DIAGNOSIS — Z8781 Personal history of (healed) traumatic fracture: Secondary | ICD-10-CM

## 2020-09-05 DIAGNOSIS — E039 Hypothyroidism, unspecified: Secondary | ICD-10-CM | POA: Diagnosis not present

## 2020-09-05 DIAGNOSIS — I480 Paroxysmal atrial fibrillation: Secondary | ICD-10-CM

## 2020-09-05 DIAGNOSIS — D500 Iron deficiency anemia secondary to blood loss (chronic): Secondary | ICD-10-CM

## 2020-09-05 NOTE — Progress Notes (Unsigned)
Established Patient Office Visit  Subjective:  Patient ID: Tina Graham, female    DOB: 10/14/1928  Age: 87 y.o. MRN: [id number]  CC:  Chief Complaint  Patient presents with  . Hospitalization Follow-up    HPI Tina Graham presents for Hospital F/U.  She was evidently swatting at a bug and actually fell forward and injured her right hip and suffered a right hip fracture.  He has had an open reduction with internal fixation.  She was admitted from December 6 until December 20.  She is now back home and doing fairly well she is using a walker feels like she is gradually getting her strength back.  Feels like her appetite is okay.  Interestingly they ended up holding a lot of her blood pressure medications while she was in the hospital but they have slowly added some back.  She is actually still off of her ACE inhibitor.      From D/C summary : [facility]  Discharge Summary  PCP: Agapito Gamesatherine D Starasia Sinko, MD Discharge Details   Admit date: 07/08/2020 Discharge date and time: 07/22/2020 Hospital LOS: 15 days  Active Hospital Problems  Diagnosis Date Noted POA  . *Closed right hip fracture, initial encounter (*) 07/08/2020 Yes  . Status post-open reduction with internal fixation of the RIGHT hip fracture with IT nail--07/12/2020 07/15/2020 Not Applicable  . Essential hypertension Yes  . GERD (gastroesophageal reflux disease) Yes  . Hyperlipidemia Yes  . Hypothyroidism Yes   Resolved Hospital Problems  Diagnosis Date Noted Date Resolved POA  . Rhabdomyolysis 07/08/2020 07/22/2020 Yes   Current Discharge Medication List   DISCONTINUED medications   amLODIPine besylate (NORVASC) 5 mg tablet   diphenhydrAMINE (BENADRYL) 25 MG tablet   lisinopril (PRINIVIL,ZESTRIL) 20 mg tablet    NEW medications  Details  acetaminophen (TYLENOL) 325 mg tablet Take two tablets (650 mg dose) by mouth every 6 (six) hours  as needed for Pain for up to 10 days. Start date: 07/22/2020, End date: 08/01/2020   clotrimazole-betamethasone (LOTRISONE) cream Apply topically 2 (two) times daily. Start date: 07/22/2020, End date: 07/22/2021   digoxin (LANOXIN,DIGITEK,DIGOX) 125 mcg tablet Take one tablet (0.125 mg dose) by mouth daily. Start date: 07/22/2020   pantoprazole sodium (PROTONIX) 40 mg tablet Take one tablet (40 mg dose) by mouth daily. Start date: 07/22/2020   potassium chloride (K-DUR,KLOR-CON) 20 mEq CR tablet Take one tablet (20 mEq dose) by mouth daily. Start date: 07/22/2020   venlafaxine HCl (EFFEXOR-XR) 150 mg 24 hr capsule Take one capsule (150 mg dose) by mouth daily. Start date: 07/23/2020    CHANGED medications  Details  ALPRAZolam (XANAX) 1 mg tablet Take one half tablet (0.5 mg dose) by mouth at bedtime as needed for Sleep. Start date: 07/22/2020   carvedilol (COREG) 25 mg tablet Take one half tablet (12.5 mg dose) by mouth 2 (two) times daily. Start date: 07/22/2020   levothyroxine sodium (SYNTHROID,LEVOTHROID,LEVOXYL) 88 mcg tablet Take one tablet (88 mcg dose) by mouth daily. Start date: 07/23/2020    CONTINUED medications  Details  aspirin 81 mg chewable tablet Chew 81 mg by mouth daily.   atorvastatin (LIPITOR) 40 mg tablet Take 40 mg by mouth daily.   calcium carbonate (TUMS) 500 mg chewable tablet Chew 1 tablet by mouth as needed.   Carboxymethylcellulose Sodium (LUBRICANT EYE DROPS OP) Place 1 drop into both eyes daily as needed.   fish oil-omega-3 fatty acids 1000 MG capsule Take  1 capsule by mouth daily.   lamoTRIgine (LAMICTAL) 25 mg tablet Take 50 mg by mouth 2 (two) times daily.   Multiple Vitamin (MULTIVITAMIN) capsule Take 1 capsule by mouth daily.   gabapentin (NEURONTIN) 300 mg capsule Take 1 capsule by mouth every morning and 2 capsules at bedtime     Reason for medication changes: See below  Hospital Course   Indication for Admission/chief  complaint: Right hip fracture  Hospital Course:  Patient is a 87 year old female with a past medical history of essential hypertension, hyperlipidemia, and osteoporosis who fell on 07/07/2020 sustaining significant right hip pain. Patient remained on the floor and subsequently developed rhabdomyolysis (CPK 1076 units/L). Upon arrival to [facility], patient had significant right hip pain on clinical examination. Radiological studies verified a mild to moderately displaced comminuted right intertrochanteric fracture.  Patient was admitted to [facility] with the following issues addressed: 1. Mild to moderately displaced comminuted right intertrochanteric fracture:  S/P Open reduction internal fixation right hip with intertrochanteric nail, long Patient is 87 year old female who sustained a fall on 07/07/2020 and subsequently developed rhabdomyolysis (unable to remain off floor). Upon admission, patient had a elevated CPK of approximately 1000 units/L which reduced after IV fluid administration. No evidence of any myoglobinuria and serum creatinine stabilized. On 07/12/2020, patient tolerated open reduction internal fixation right hip with intertrochanteric nail by Dr Earma Reading.  Clinical plan is transition to skilled nurse facility for further rehabilitation. She will remain 50% weight bearing right lower leg  Will need follow up with Dr. Earma Reading in 2 weeks  Her pain has been well controlled with just use of acetaminophen only.  2. Essential hypertension:  She's had some low blood pressures so most of her blood pressure medications were held including amlodipine and Lisinopril. For now continue coreg 12.5mg  BID and monitor for need to increase dose or add back her previous antihypertensives.  3. Hypothyroidism: Continue levothyroxine 88 mcg daily. Check TSH in 6 weeks to see if dose needs to be adjusted.  4. Paroxysmal atrial fibrillation- now in normal sinus  rhythm: Patient presented to [facility] with atrial fibrillation with a rapid ventricular response. As result, surgical intervention was delayed for 48 hours due to tachyarrhythmia. Patient developed normal sinus rhythm after administration of digoxin bolus and maintenance. Clinical plan is to continue beta-blocker therapy with carvedilol and digoxin. She remains off anticoagulation as risk of ongoing anticoagulation outweigh stroke prevention benefits at this time. She will continue aspirin. Check BMP in one week to ensure stable renal indices and electrolytes. May consider increasing dose of coreg and stopping digoxin once blood pressures remains stable.    5. Anemia:   Anemia possibly from bone fracture  Heme-positive stools Patient presented [facility] with a mild decrease in initial serum hemoglobin due to probable injury and blood loss from bone fracture. On 07/14/2020, patient had evidence of bright red blood per rectum most likely from hemorrhoidal bleeding. She has received a total of 2 units of packed red blood cell transfusion during this hospitalization. Last transfusion was on 07/15/2020 with subsequent stabilization of hemoglobin. Patient will be treated with Protonix 40 mg daily for GI prophylaxis    Skin care: Inner buttocks, gluteal crease and perirectum: Clean area with cleanser spray. Apply lotrisone cream to reddened area. Cover with moisture barrier ointment. APPLY BID.  Recommendations to physicians/followup needed:  1. Right lower extremity-partial weightbearing (50%) as tolerated for transfers. Transfers only with no gait training currently. 2.  Out of bed to chair 3 times daily 3. PT/OT consultation  Physical Exam: BP (!) 154/52 (BP Location: Right arm, Patient Position: Lying)  Pulse 69  Temp 98.1 F (36.7 C) (Oral)  Resp 20  Ht 1.524 m (5')  Wt 63.9 kg (140 lb 14.4 oz)  SpO2 96%  BMI 27.52 kg/m  Alert. Heart regular. Lungs  clear. Right thigh dressing clean dry and intact  Labs on Discharge:  Recent Labs  Lab 07/15/20 1459 07/16/20 0718 07/20/20 0319  WBC 12.6* 12.2* 11.2*  HGB 8.0* 8.7* 9.9*  HCT 24.0* 26.6* 31.3*  PLT 277 281 380   Recent Labs  Lab 07/15/20 1459 07/16/20 0718 07/20/20 0319 07/21/20 0218  NA 134* 136 127* 134*  K 3.3* 3.6* 4.7 4.9  CL 104 105 94* 100  CO2 25 23 27 27   BUN 22 18 11 12   CREATININE 0.58 0.47* 0.50* 0.53*  CALCIUM 7.9* 8.2* 8.9 8.8   Recent Labs  Lab 07/15/20 1459 07/16/20 0718  BILITOT 2.45* 2.36*  AST 28 28  ALT 28 27  ALKPHOS 105 116  ALBUMIN 2.6* 2.7*   No results for input(s): TSH, HGBA1C in the last 168 hours. No results for input(s): LABPROT, INR, PTT in the last 168 hours. No results for input(s): CHOL, LDL, HDL, TRIG in the last 168 hours. No results for input(s): TROPONIN, CK in the last 168 hours.  Invalid input(s): CK-MB  Diagnostics:   Xray right hip 07/08/20 Mild to moderately displaced comminuted right intratrochanteric fracture.  [facility]   Discharge Procedure Orders  Regular Diet   Potential for Rehab: Good Code Status: Full Code Disposition: Skilled nursing facility Consults: Orthopedic surgery  Followup appointments:  Future Appointments  Date Time Provider Department Center  07/31/2020 10:15 AM Doloris Hall, MD [facility] None   Time spent in discharge process: total time spent 35 minutes  Electronically signed: Milinda Antis, DO 07/22/2020 / 9:15 AM    Past Medical History:  Diagnosis Date  . H. pylori infection    treated  . HOH (hard of hearing)   . Hyperlipidemia   . Hypertension   . Hypothyroidism     Past Surgical History:  Procedure Laterality Date  . APPENDECTOMY    . CHOLECYSTECTOMY    . THYROIDECTOMY     partial for goiter    Family History  Problem Relation Age of Onset  . Cancer Daughter   . Heart disease Sister        unknown specifics    Social History    Socioeconomic History  . Marital status: Widowed    Spouse name: Not on file  . Number of children: 5  . Years of education: 6  . Highest education level: GED or equivalent  Occupational History    Employer: RETIRED  Tobacco Use  . Smoking status: Former Games developer  . Smokeless tobacco: Never Used  Vaping Use  . Vaping Use: Never used  Substance and Sexual Activity  . Alcohol use: No  . Drug use: No  . Sexual activity: Not Currently    Comment: housewife, widowed, lives alone, regular exercise  Other Topics Concern  . Not on file  Social History Narrative   Has back problems and sleeps a lot due to this- back pain   Social Determinants of Health   Financial Resource Strain: Not on file  Food Insecurity: Not on file  Transportation Needs: Not on file  Physical Activity: Not on file  Stress:  Not on file  Social Connections: Not on file  Intimate Partner Violence: Not on file    Outpatient Medications Prior to Visit  Medication Sig Dispense Refill  . amLODipine (NORVASC) 5 MG tablet TAKE 1 TABLET (5 MG TOTAL) BY MOUTH DAILY. 90 tablet 1  . aspirin EC 81 MG tablet Take 81 mg by mouth daily.    Marland Kitchen atorvastatin (LIPITOR) 40 MG tablet TAKE 1 TABLET EVERY DAY 90 tablet 3  . carvedilol (COREG) 25 MG tablet     . digoxin (LANOXIN) 0.125 MG tablet Take by mouth.    . lamoTRIgine (LAMICTAL) 25 MG tablet Take 50 mg by mouth 2 (two) times daily.     Marland Kitchen levothyroxine (SYNTHROID) 100 MCG tablet Take 1 tablet (100 mcg total) by mouth daily. 90 tablet 3  . Multiple Vitamins-Minerals (PRESERVISION AREDS 2 PO) Take by mouth.    . Omega-3 Fatty Acids (FISH OIL) 1000 MG CAPS Take 1,000 mg by mouth daily.    . pantoprazole (PROTONIX) 40 MG tablet Take by mouth.    . potassium chloride SA (KLOR-CON) 20 MEQ tablet Take by mouth.    . vitamin B-12 (CYANOCOBALAMIN) 1000 MCG tablet Take 1,000 mcg by mouth daily.    Marland Kitchen ALPRAZolam (XANAX) 1 MG tablet TAKE 1 TABLET AT BEDTIME AS NEEDED FOR ANXIETY.  (Patient not taking: Reported on 09/05/2020) 30 tablet 1  . Bioflavonoid Products (BIOFLEX PO) Take by mouth.    Marland Kitchen lisinopril (ZESTRIL) 20 MG tablet Take 20 mg by mouth daily.    Marland Kitchen triamcinolone cream (KENALOG) 0.1 % APPLY TOPICALLY TWICE DAILY 30 g 1   No facility-administered medications prior to visit.    Allergies  Allergen Reactions  . Donepezil Other (See Comments)    nightmares  . Ibuprofen Other (See Comments)    Pt unable to recall reaction Patient is not sure what happens if she takes this medicine.  . Memantine Other (See Comments)    Nightmares   . Simvastatin Other (See Comments)    REACTION: myalgias    ROS Review of Systems    Objective:    Physical Exam  BP (!) 191/78   Pulse 75   Ht 5' (1.524 m)   Wt 116 lb (52.6 kg)   SpO2 100%   BMI 22.65 kg/m  Wt Readings from Last 3 Encounters:  09/05/20 116 lb (52.6 kg)  04/02/20 120 lb (54.4 kg)  01/02/20 118 lb (53.5 kg)     Health Maintenance Due  Topic Date Due  . COVID-19 Vaccine (2 - Booster for Janssen series) 12/30/2019    There are no preventive care reminders to display for this patient.  Lab Results  Component Value Date   TSH 1.98 12/07/2019   Lab Results  Component Value Date   WBC 8.6 10/02/2019   HGB 13.2 10/02/2019   HCT 40.4 10/02/2019   MCV 91.2 10/02/2019   PLT 303 10/02/2019   Lab Results  Component Value Date   NA 140 09/05/2020   K 4.0 09/05/2020   CO2 27 09/05/2020   GLUCOSE 99 09/05/2020   BUN 14 09/05/2020   CREATININE 0.77 09/05/2020   BILITOT 1.0 10/02/2019   ALKPHOS 98 11/05/2016   AST 17 10/02/2019   ALT 14 10/02/2019   PROT 6.4 10/02/2019   ALBUMIN 4.0 11/05/2016   CALCIUM 10.2 09/05/2020   Lab Results  Component Value Date   CHOL 114 10/02/2019   Lab Results  Component Value Date   HDL 51 10/02/2019  Lab Results  Component Value Date   LDLCALC 47 10/02/2019   Lab Results  Component Value Date   TRIG 76 10/02/2019   Lab Results  Component  Value Date   CHOLHDL 2.2 10/02/2019   Lab Results  Component Value Date   HGBA1C 5.2 10/02/2019      Assessment & Plan:   Problem List Items Addressed This Visit      Cardiovascular and Mediastinum   PAF (paroxysmal atrial fibrillation) (HCC)    Tolerating daily aspirin therapy well.  Now on a PPI chronically.      Relevant Medications   digoxin (LANOXIN) 0.125 MG tablet   HYPERTENSION, BENIGN - Primary    Blood pressure is extremely high today.  We may need to consider adding back the lisinopril 20 mg which she was previously on.  We will have her come back in a week to recheck her blood pressure.      Relevant Medications   digoxin (LANOXIN) 0.125 MG tablet   Other Relevant Orders   BASIC METABOLIC PANEL WITH GFR   TSH   CBC     Endocrine   Hypothyroidism    Hospital physician had also recommended that we recheck her TSH to make sure that it did not need to be adjusted after hospitalization.        Other   S/P right hip fracture    Still using a walker and I think she still getting some physical therapy.  Hopefully she will just continue to recover and do well.  Right now her grandson is staying with her so there is someone there 24/7 to help her.  We also discussed the importance of getting an accurate a quick calcium with vitamin D and encouraged her to pick up a supplement.  In addition discussed starting a bone builder such as Fosamax or Boniva.  We will discuss further at her follow-up.      Anemia, blood loss    Due to recheck CBC she did require blood transfusion while hospitalized.  They felt like it was likely secondary to blood loss from the fracture.  She was started on a PPI as well during the hospitalization for GI prophylaxis.  As she did have some heme positive stools.  She wanted to know if she should continue the medication or not I think if she is going to be on chronic daily aspirin therapy for her history of TIA and atrial fibrillation actually think  it would be a good idea for her to continue the PPI as well.      Relevant Medications   vitamin B-12 (CYANOCOBALAMIN) 1000 MCG tablet   Other Relevant Orders   BASIC METABOLIC PANEL WITH GFR   TSH   CBC      No orders of the defined types were placed in this encounter.   Follow-up: Return in about 3 months (around 12/03/2020) for bp.   I spent 42 minutes on the day of the encounter to include pre-visit record review, face-to-face time with the patient and post visit ordering of test.   Nani Gasser, MD

## 2020-09-06 ENCOUNTER — Telehealth: Payer: Self-pay | Admitting: Family Medicine

## 2020-09-06 ENCOUNTER — Encounter: Payer: Self-pay | Admitting: Family Medicine

## 2020-09-06 DIAGNOSIS — K219 Gastro-esophageal reflux disease without esophagitis: Secondary | ICD-10-CM | POA: Diagnosis not present

## 2020-09-06 DIAGNOSIS — Z8781 Personal history of (healed) traumatic fracture: Secondary | ICD-10-CM | POA: Insufficient documentation

## 2020-09-06 DIAGNOSIS — I480 Paroxysmal atrial fibrillation: Secondary | ICD-10-CM | POA: Diagnosis not present

## 2020-09-06 DIAGNOSIS — I10 Essential (primary) hypertension: Secondary | ICD-10-CM | POA: Diagnosis not present

## 2020-09-06 DIAGNOSIS — D500 Iron deficiency anemia secondary to blood loss (chronic): Secondary | ICD-10-CM | POA: Insufficient documentation

## 2020-09-06 DIAGNOSIS — S72141D Displaced intertrochanteric fracture of right femur, subsequent encounter for closed fracture with routine healing: Secondary | ICD-10-CM | POA: Diagnosis not present

## 2020-09-06 DIAGNOSIS — D649 Anemia, unspecified: Secondary | ICD-10-CM | POA: Diagnosis not present

## 2020-09-06 DIAGNOSIS — E785 Hyperlipidemia, unspecified: Secondary | ICD-10-CM | POA: Diagnosis not present

## 2020-09-06 DIAGNOSIS — E039 Hypothyroidism, unspecified: Secondary | ICD-10-CM | POA: Diagnosis not present

## 2020-09-06 DIAGNOSIS — Z9181 History of falling: Secondary | ICD-10-CM | POA: Diagnosis not present

## 2020-09-06 LAB — BASIC METABOLIC PANEL WITH GFR
BUN: 14 mg/dL (ref 7–25)
CO2: 27 mmol/L (ref 20–32)
Calcium: 10.2 mg/dL (ref 8.6–10.4)
Chloride: 102 mmol/L (ref 98–110)
Creat: 0.77 mg/dL (ref 0.60–0.88)
GFR, Est African American: 79 mL/min/{1.73_m2} (ref >=60)
GFR, Est Non African American: 68 mL/min/{1.73_m2} (ref >=60)
Glucose, Bld: 99 mg/dL (ref 65–99)
Potassium: 4 mmol/L (ref 3.5–5.3)
Sodium: 140 mmol/L (ref 135–146)

## 2020-09-06 NOTE — Telephone Encounter (Signed)
Call patient and let her know that I did put in her lab orders and would like for her to go sometime next week if possible.  I also like her to schedule a nurse visit to recheck her blood pressure and less a nurses coming out to her house if they are then we can get them to check her blood pressure and let us know what it is doing.

## 2020-09-06 NOTE — Progress Notes (Signed)
All labs are normal. 

## 2020-09-06 NOTE — Assessment & Plan Note (Signed)
Hospital physician had also recommended that we recheck her TSH to make sure that it did not need to be adjusted after hospitalization.

## 2020-09-06 NOTE — Assessment & Plan Note (Signed)
Still using a walker and I think she still getting some physical therapy.  Hopefully she will just continue to recover and do well.  Right now her grandson is staying with her so there is someone there 24/7 to help her.  We also discussed the importance of getting an accurate a quick calcium with vitamin D and encouraged her to pick up a supplement.  In addition discussed starting a bone builder such as Fosamax or Boniva.  We will discuss further at her follow-up.

## 2020-09-06 NOTE — Telephone Encounter (Signed)
Spoke with pt this afternoon and she stated that she will try to get a ride sometime next week to get those labs drawn.  She does have a home health nurse that comes out once or twice a week but pt said she checks her own bp twice a day herself.  I advised her to keep an eye on her numbers and to let us know how they're looking.

## 2020-09-06 NOTE — Assessment & Plan Note (Signed)
Tolerating daily aspirin therapy well.  Now on a PPI chronically.

## 2020-09-06 NOTE — Assessment & Plan Note (Addendum)
Due to recheck CBC she did require blood transfusion while hospitalized.  They felt like it was likely secondary to blood loss from the fracture.  She was started on a PPI as well during the hospitalization for GI prophylaxis.  As she did have some heme positive stools.  She wanted to know if she should continue the medication or not I think if she is going to be on chronic daily aspirin therapy for her history of TIA and atrial fibrillation actually think it would be a good idea for her to continue the PPI as well.

## 2020-09-06 NOTE — Assessment & Plan Note (Signed)
Blood pressure is extremely high today.  We may need to consider adding back the lisinopril 20 mg which she was previously on.  We will have her come back in a week to recheck her blood pressure.

## 2020-09-10 ENCOUNTER — Telehealth: Payer: Self-pay | Admitting: Family Medicine

## 2020-09-10 DIAGNOSIS — K219 Gastro-esophageal reflux disease without esophagitis: Secondary | ICD-10-CM | POA: Diagnosis not present

## 2020-09-10 DIAGNOSIS — I480 Paroxysmal atrial fibrillation: Secondary | ICD-10-CM | POA: Diagnosis not present

## 2020-09-10 DIAGNOSIS — E039 Hypothyroidism, unspecified: Secondary | ICD-10-CM | POA: Diagnosis not present

## 2020-09-10 DIAGNOSIS — D649 Anemia, unspecified: Secondary | ICD-10-CM | POA: Diagnosis not present

## 2020-09-10 DIAGNOSIS — E785 Hyperlipidemia, unspecified: Secondary | ICD-10-CM | POA: Diagnosis not present

## 2020-09-10 DIAGNOSIS — I10 Essential (primary) hypertension: Secondary | ICD-10-CM | POA: Diagnosis not present

## 2020-09-10 DIAGNOSIS — Z9181 History of falling: Secondary | ICD-10-CM | POA: Diagnosis not present

## 2020-09-10 DIAGNOSIS — S72141D Displaced intertrochanteric fracture of right femur, subsequent encounter for closed fracture with routine healing: Secondary | ICD-10-CM | POA: Diagnosis not present

## 2020-09-10 NOTE — Telephone Encounter (Signed)
Agree with documentation as above.   Laron Boorman, MD  

## 2020-09-10 NOTE — Telephone Encounter (Signed)
OT from [facility] home health called (Tina Graham is his name), stating he needs an order for OT for once a week for 5 weeks including this week for the patient. He stated we can reach him or leave him a voice mail with the order at [phone].

## 2020-09-10 NOTE — Telephone Encounter (Signed)
LVM giving VO for OT for 1x week for 5 weeks including this week for patient.

## 2020-09-11 DIAGNOSIS — D649 Anemia, unspecified: Secondary | ICD-10-CM | POA: Diagnosis not present

## 2020-09-11 DIAGNOSIS — E039 Hypothyroidism, unspecified: Secondary | ICD-10-CM | POA: Diagnosis not present

## 2020-09-11 DIAGNOSIS — K219 Gastro-esophageal reflux disease without esophagitis: Secondary | ICD-10-CM | POA: Diagnosis not present

## 2020-09-11 DIAGNOSIS — Z9181 History of falling: Secondary | ICD-10-CM | POA: Diagnosis not present

## 2020-09-11 DIAGNOSIS — I10 Essential (primary) hypertension: Secondary | ICD-10-CM | POA: Diagnosis not present

## 2020-09-11 DIAGNOSIS — S72141D Displaced intertrochanteric fracture of right femur, subsequent encounter for closed fracture with routine healing: Secondary | ICD-10-CM | POA: Diagnosis not present

## 2020-09-11 DIAGNOSIS — I480 Paroxysmal atrial fibrillation: Secondary | ICD-10-CM | POA: Diagnosis not present

## 2020-09-11 DIAGNOSIS — E785 Hyperlipidemia, unspecified: Secondary | ICD-10-CM | POA: Diagnosis not present

## 2020-09-12 DIAGNOSIS — M5417 Radiculopathy, lumbosacral region: Secondary | ICD-10-CM | POA: Diagnosis not present

## 2020-09-12 DIAGNOSIS — R202 Paresthesia of skin: Secondary | ICD-10-CM | POA: Diagnosis not present

## 2020-09-12 DIAGNOSIS — I639 Cerebral infarction, unspecified: Secondary | ICD-10-CM | POA: Diagnosis not present

## 2020-09-12 DIAGNOSIS — M5412 Radiculopathy, cervical region: Secondary | ICD-10-CM | POA: Diagnosis not present

## 2020-09-17 ENCOUNTER — Telehealth: Payer: Self-pay | Admitting: Family Medicine

## 2020-09-17 DIAGNOSIS — S72141D Displaced intertrochanteric fracture of right femur, subsequent encounter for closed fracture with routine healing: Secondary | ICD-10-CM | POA: Diagnosis not present

## 2020-09-17 DIAGNOSIS — I10 Essential (primary) hypertension: Secondary | ICD-10-CM | POA: Diagnosis not present

## 2020-09-17 DIAGNOSIS — D649 Anemia, unspecified: Secondary | ICD-10-CM | POA: Diagnosis not present

## 2020-09-17 DIAGNOSIS — E039 Hypothyroidism, unspecified: Secondary | ICD-10-CM | POA: Diagnosis not present

## 2020-09-17 DIAGNOSIS — E785 Hyperlipidemia, unspecified: Secondary | ICD-10-CM | POA: Diagnosis not present

## 2020-09-17 DIAGNOSIS — K219 Gastro-esophageal reflux disease without esophagitis: Secondary | ICD-10-CM | POA: Diagnosis not present

## 2020-09-17 DIAGNOSIS — I480 Paroxysmal atrial fibrillation: Secondary | ICD-10-CM | POA: Diagnosis not present

## 2020-09-17 DIAGNOSIS — I4891 Unspecified atrial fibrillation: Secondary | ICD-10-CM | POA: Diagnosis not present

## 2020-09-17 DIAGNOSIS — Z9181 History of falling: Secondary | ICD-10-CM | POA: Diagnosis not present

## 2020-09-17 NOTE — Telephone Encounter (Signed)
Pt was seen on 09/05/20 for her HFU and called and left a message today stating that she needs Dr. Shelah Graham assistant to call her about a medicine refill that the hospital had her on. Call [phone]

## 2020-09-20 ENCOUNTER — Other Ambulatory Visit: Payer: Self-pay | Admitting: Medical-Surgical

## 2020-09-20 DIAGNOSIS — E785 Hyperlipidemia, unspecified: Secondary | ICD-10-CM | POA: Diagnosis not present

## 2020-09-20 DIAGNOSIS — I10 Essential (primary) hypertension: Secondary | ICD-10-CM | POA: Diagnosis not present

## 2020-09-20 DIAGNOSIS — Z9181 History of falling: Secondary | ICD-10-CM | POA: Diagnosis not present

## 2020-09-20 DIAGNOSIS — K219 Gastro-esophageal reflux disease without esophagitis: Secondary | ICD-10-CM | POA: Diagnosis not present

## 2020-09-20 DIAGNOSIS — D649 Anemia, unspecified: Secondary | ICD-10-CM | POA: Diagnosis not present

## 2020-09-20 DIAGNOSIS — S72141D Displaced intertrochanteric fracture of right femur, subsequent encounter for closed fracture with routine healing: Secondary | ICD-10-CM | POA: Diagnosis not present

## 2020-09-20 DIAGNOSIS — I480 Paroxysmal atrial fibrillation: Secondary | ICD-10-CM | POA: Diagnosis not present

## 2020-09-20 DIAGNOSIS — E039 Hypothyroidism, unspecified: Secondary | ICD-10-CM | POA: Diagnosis not present

## 2020-09-20 NOTE — Telephone Encounter (Signed)
No answer or v/m 

## 2020-09-20 NOTE — Telephone Encounter (Signed)
Plesae call patient.  I see that the pharmacy requested a refill on her thyroid medication.  She needs to go for her blood work first so that we can make sure that we do not need to adjust her regimen.

## 2020-09-24 DIAGNOSIS — Z9181 History of falling: Secondary | ICD-10-CM | POA: Diagnosis not present

## 2020-09-24 DIAGNOSIS — I10 Essential (primary) hypertension: Secondary | ICD-10-CM | POA: Diagnosis not present

## 2020-09-24 DIAGNOSIS — S72141D Displaced intertrochanteric fracture of right femur, subsequent encounter for closed fracture with routine healing: Secondary | ICD-10-CM | POA: Diagnosis not present

## 2020-09-24 DIAGNOSIS — E039 Hypothyroidism, unspecified: Secondary | ICD-10-CM | POA: Diagnosis not present

## 2020-09-24 DIAGNOSIS — E785 Hyperlipidemia, unspecified: Secondary | ICD-10-CM | POA: Diagnosis not present

## 2020-09-24 DIAGNOSIS — I480 Paroxysmal atrial fibrillation: Secondary | ICD-10-CM | POA: Diagnosis not present

## 2020-09-24 DIAGNOSIS — K219 Gastro-esophageal reflux disease without esophagitis: Secondary | ICD-10-CM | POA: Diagnosis not present

## 2020-09-24 DIAGNOSIS — D649 Anemia, unspecified: Secondary | ICD-10-CM | POA: Diagnosis not present

## 2020-09-26 DIAGNOSIS — S72141D Displaced intertrochanteric fracture of right femur, subsequent encounter for closed fracture with routine healing: Secondary | ICD-10-CM | POA: Diagnosis not present

## 2020-09-26 DIAGNOSIS — I10 Essential (primary) hypertension: Secondary | ICD-10-CM | POA: Diagnosis not present

## 2020-09-26 DIAGNOSIS — E039 Hypothyroidism, unspecified: Secondary | ICD-10-CM | POA: Diagnosis not present

## 2020-09-26 DIAGNOSIS — Z9181 History of falling: Secondary | ICD-10-CM | POA: Diagnosis not present

## 2020-09-26 DIAGNOSIS — I480 Paroxysmal atrial fibrillation: Secondary | ICD-10-CM | POA: Diagnosis not present

## 2020-09-26 DIAGNOSIS — K219 Gastro-esophageal reflux disease without esophagitis: Secondary | ICD-10-CM | POA: Diagnosis not present

## 2020-09-26 DIAGNOSIS — D649 Anemia, unspecified: Secondary | ICD-10-CM | POA: Diagnosis not present

## 2020-09-26 DIAGNOSIS — E785 Hyperlipidemia, unspecified: Secondary | ICD-10-CM | POA: Diagnosis not present

## 2020-09-26 NOTE — Telephone Encounter (Signed)
Partial lab results located through manual search on [facility] portal. The laboratory failed to complete other testing for Thyroid/CBC. Partial results placed in provider's box for review.

## 2020-09-26 NOTE — Telephone Encounter (Signed)
Which lab did she go to?  There is nothing in our computer system.  Maybe we need to check the database for [facility] to see if maybe it just did not come across.    Did go ahead and send a 90-day refill for now.  But again I do not see the labs.

## 2020-09-26 NOTE — Telephone Encounter (Signed)
I called pt to tell her to get her labs completed for her thyroid meds to be refilled and she states that she went last week to get labs and she is about out of her meds

## 2020-09-27 NOTE — Telephone Encounter (Signed)
Okay, then we need to call [facility] and asked them why they did not run the additional labs.  And then it is on them to redraw her without a charge if they did not draw them.

## 2020-09-30 DIAGNOSIS — I480 Paroxysmal atrial fibrillation: Secondary | ICD-10-CM | POA: Diagnosis not present

## 2020-09-30 DIAGNOSIS — I10 Essential (primary) hypertension: Secondary | ICD-10-CM | POA: Diagnosis not present

## 2020-09-30 DIAGNOSIS — E039 Hypothyroidism, unspecified: Secondary | ICD-10-CM | POA: Diagnosis not present

## 2020-09-30 DIAGNOSIS — D649 Anemia, unspecified: Secondary | ICD-10-CM | POA: Diagnosis not present

## 2020-09-30 DIAGNOSIS — Z9181 History of falling: Secondary | ICD-10-CM | POA: Diagnosis not present

## 2020-09-30 DIAGNOSIS — K219 Gastro-esophageal reflux disease without esophagitis: Secondary | ICD-10-CM | POA: Diagnosis not present

## 2020-09-30 DIAGNOSIS — S72141D Displaced intertrochanteric fracture of right femur, subsequent encounter for closed fracture with routine healing: Secondary | ICD-10-CM | POA: Diagnosis not present

## 2020-09-30 DIAGNOSIS — E785 Hyperlipidemia, unspecified: Secondary | ICD-10-CM | POA: Diagnosis not present

## 2020-10-10 DIAGNOSIS — I480 Paroxysmal atrial fibrillation: Secondary | ICD-10-CM | POA: Diagnosis not present

## 2020-10-10 DIAGNOSIS — Z9181 History of falling: Secondary | ICD-10-CM | POA: Diagnosis not present

## 2020-10-10 DIAGNOSIS — E039 Hypothyroidism, unspecified: Secondary | ICD-10-CM | POA: Diagnosis not present

## 2020-10-10 DIAGNOSIS — E785 Hyperlipidemia, unspecified: Secondary | ICD-10-CM | POA: Diagnosis not present

## 2020-10-10 DIAGNOSIS — S72141D Displaced intertrochanteric fracture of right femur, subsequent encounter for closed fracture with routine healing: Secondary | ICD-10-CM | POA: Diagnosis not present

## 2020-10-10 DIAGNOSIS — K219 Gastro-esophageal reflux disease without esophagitis: Secondary | ICD-10-CM | POA: Diagnosis not present

## 2020-10-10 DIAGNOSIS — D649 Anemia, unspecified: Secondary | ICD-10-CM | POA: Diagnosis not present

## 2020-10-10 DIAGNOSIS — I10 Essential (primary) hypertension: Secondary | ICD-10-CM | POA: Diagnosis not present

## 2020-10-18 ENCOUNTER — Telehealth: Payer: Self-pay

## 2020-10-18 NOTE — Telephone Encounter (Signed)
If she was already on this regimen when she went to the lab in February then we can just continue doing what she is doing as her potassium did look good on February 3.  If this regimen was started after that then we would need to recheck her potassium to make sure that it is adequate.

## 2020-10-18 NOTE — Telephone Encounter (Signed)
Tina Graham states the pharmacy called about her potassium medication. She hasn't been on potassium for 3 months. She states she takes a multivitamin and a shake with potassium. She wanted to know if she needs the potassium.

## 2020-10-18 NOTE — Telephone Encounter (Signed)
She states she hasn't taken it in 3 months.

## 2020-10-21 NOTE — Telephone Encounter (Signed)
Patient advised. Removed from medication list.

## 2020-10-21 NOTE — Telephone Encounter (Signed)
OK, to hold on restarting potassium then.  We can remove from med list.

## 2020-10-29 DIAGNOSIS — E785 Hyperlipidemia, unspecified: Secondary | ICD-10-CM | POA: Diagnosis not present

## 2020-10-29 DIAGNOSIS — K219 Gastro-esophageal reflux disease without esophagitis: Secondary | ICD-10-CM | POA: Diagnosis not present

## 2020-10-29 DIAGNOSIS — Z741 Need for assistance with personal care: Secondary | ICD-10-CM | POA: Diagnosis not present

## 2020-10-29 DIAGNOSIS — I10 Essential (primary) hypertension: Secondary | ICD-10-CM | POA: Diagnosis not present

## 2020-10-29 DIAGNOSIS — Z9181 History of falling: Secondary | ICD-10-CM | POA: Diagnosis not present

## 2020-10-29 DIAGNOSIS — S72141D Displaced intertrochanteric fracture of right femur, subsequent encounter for closed fracture with routine healing: Secondary | ICD-10-CM | POA: Diagnosis not present

## 2020-10-29 DIAGNOSIS — I480 Paroxysmal atrial fibrillation: Secondary | ICD-10-CM | POA: Diagnosis not present

## 2020-10-29 DIAGNOSIS — D649 Anemia, unspecified: Secondary | ICD-10-CM | POA: Diagnosis not present

## 2020-10-29 DIAGNOSIS — E039 Hypothyroidism, unspecified: Secondary | ICD-10-CM | POA: Diagnosis not present

## 2020-10-31 DIAGNOSIS — K219 Gastro-esophageal reflux disease without esophagitis: Secondary | ICD-10-CM | POA: Diagnosis not present

## 2020-10-31 DIAGNOSIS — I480 Paroxysmal atrial fibrillation: Secondary | ICD-10-CM | POA: Diagnosis not present

## 2020-10-31 DIAGNOSIS — I10 Essential (primary) hypertension: Secondary | ICD-10-CM | POA: Diagnosis not present

## 2020-10-31 DIAGNOSIS — E039 Hypothyroidism, unspecified: Secondary | ICD-10-CM | POA: Diagnosis not present

## 2020-10-31 DIAGNOSIS — E785 Hyperlipidemia, unspecified: Secondary | ICD-10-CM | POA: Diagnosis not present

## 2020-10-31 DIAGNOSIS — S72141D Displaced intertrochanteric fracture of right femur, subsequent encounter for closed fracture with routine healing: Secondary | ICD-10-CM | POA: Diagnosis not present

## 2020-10-31 DIAGNOSIS — Z9181 History of falling: Secondary | ICD-10-CM | POA: Diagnosis not present

## 2020-10-31 DIAGNOSIS — D649 Anemia, unspecified: Secondary | ICD-10-CM | POA: Diagnosis not present

## 2020-10-31 DIAGNOSIS — Z741 Need for assistance with personal care: Secondary | ICD-10-CM | POA: Diagnosis not present

## 2020-11-06 ENCOUNTER — Telehealth: Payer: Self-pay | Admitting: General Practice

## 2020-11-06 ENCOUNTER — Other Ambulatory Visit: Payer: Self-pay

## 2020-11-06 ENCOUNTER — Other Ambulatory Visit: Payer: Self-pay | Admitting: *Deleted

## 2020-11-06 ENCOUNTER — Ambulatory Visit (INDEPENDENT_AMBULATORY_CARE_PROVIDER_SITE_OTHER): Payer: Medicare HMO | Admitting: Family Medicine

## 2020-11-06 VITALS — BP 149/63 | HR 59 | Resp 16 | Ht 60.0 in | Wt 114.1 lb

## 2020-11-06 DIAGNOSIS — I10 Essential (primary) hypertension: Secondary | ICD-10-CM | POA: Diagnosis not present

## 2020-11-06 DIAGNOSIS — Z0000 Encounter for general adult medical examination without abnormal findings: Secondary | ICD-10-CM | POA: Diagnosis not present

## 2020-11-06 DIAGNOSIS — D500 Iron deficiency anemia secondary to blood loss (chronic): Secondary | ICD-10-CM | POA: Diagnosis not present

## 2020-11-06 MED ORDER — PANTOPRAZOLE SODIUM 40 MG PO TBEC: 40.0000 mg | DELAYED_RELEASE_TABLET | Freq: Every day | ORAL | 3 refills | Status: DC

## 2020-11-06 NOTE — Progress Notes (Signed)
MEDICARE ANNUAL WELLNESS VISIT  11/06/2020  Subjective:  Tina Graham is a 87 y.o. female patient of Metheney, Barbarann Ehlers, MD who had a Medicare Annual Wellness Visit today. Tina Graham is Retired and lives alone. she has 5 children, 2 have deceased. she reports that she is socially active and does interact with friends/family regularly. she is minimally physically active and enjoys watching tv.  Patient Care Team: Agapito Games, MD as PCP - General (Family Medicine) Aletha Halim, MD as Referring Physician (Rheumatology)  Advanced Directives 11/06/2020 03/20/2019  Does Patient Have a Medical Advance Directive? Yes Yes  Type of Advance Directive Living will;Healthcare Power of [employer] Power of [place];Living will  Does patient want to make changes to medical advance directive? No - Patient declined No - Patient declined  Copy of Healthcare Power of Attorney in Chart? No - copy requested No - copy requested    Hospital Utilization Over the Past 12 Months: # of hospitalizations or ER visits: 2 # of surgeries: 1  Review of Systems    Patient reports that her overall health is unchanged when compared to last year.  Review of Systems: History obtained from chart review and the patient  All other systems negative.  Pain Assessment Pain : No/denies pain     Current Medications & Allergies (verified) Allergies as of 11/06/2020      Reactions   Donepezil Other (See Comments)   nightmares   Ibuprofen Other (See Comments)   Pt unable to recall reaction Patient is not sure what happens if she takes this medicine.   Memantine Other (See Comments)   Nightmares    Simvastatin Other (See Comments)   REACTION: myalgias      Medication List       Accurate as of November 06, 2020 10:44 AM. If you have any questions, ask your nurse or doctor.        amLODipine 5 MG tablet Commonly known as: NORVASC TAKE 1 TABLET (5 MG TOTAL) BY MOUTH DAILY.   aspirin EC 81 MG  tablet Take 81 mg by mouth daily.   atorvastatin 40 MG tablet Commonly known as: LIPITOR TAKE 1 TABLET EVERY DAY   carvedilol 25 MG tablet Commonly known as: COREG   digoxin 0.125 MG tablet Commonly known as: LANOXIN Take by mouth.   Fish Oil 1000 MG Caps Take 1,000 mg by mouth daily.   lamoTRIgine 25 MG tablet Commonly known as: LAMICTAL Take 50 mg by mouth 2 (two) times daily.   levothyroxine 100 MCG tablet Commonly known as: SYNTHROID TAKE 1 TABLET ONE TIME DAILY   pantoprazole 40 MG tablet Commonly known as: PROTONIX Take 1 tablet (40 mg total) by mouth daily. What changed:   how much to take  when to take this Changed by: Heath Gold, CMA   PRESERVISION AREDS 2 PO Take by mouth.   vitamin B-12 1000 MCG tablet Commonly known as: CYANOCOBALAMIN Take 1,000 mcg by mouth daily.       History (reviewed): Past Medical History:  Diagnosis Date  . H. pylori infection    treated  . HOH (hard of hearing)   . Hyperlipidemia   . Hypertension   . Hypothyroidism    Past Surgical History:  Procedure Laterality Date  . APPENDECTOMY    . CHOLECYSTECTOMY    . THYROIDECTOMY     partial for goiter   Family History  Problem Relation Age of Onset  . Cancer Daughter   . Heart disease Sister  unknown specifics   Social History   Socioeconomic History  . Marital status: Widowed    Spouse name: Not on file  . Number of children: 5  . Years of education: 6  . Highest education level: GED or equivalent  Occupational History    Employer: RETIRED  Tobacco Use  . Smoking status: Former Games developer  . Smokeless tobacco: Never Used  Vaping Use  . Vaping Use: Never used  Substance and Sexual Activity  . Alcohol use: No  . Drug use: No  . Sexual activity: Not Currently    Comment: housewife, widowed, lives alone, regular exercise  Other Topics Concern  . Not on file  Social History Narrative   Has back problems and sleeps a lot due to this- back  pain. Watches tv in her free time.   Social Determinants of Health   Financial Resource Strain: Low Risk   . Difficulty of Paying Living Expenses: Not hard at all  Food Insecurity: No Food Insecurity  . Worried About Programme researcher, broadcasting/film/video in the Last Year: Never true  . Ran Out of Food in the Last Year: Never true  Transportation Needs: No Transportation Needs  . Lack of Transportation (Medical): No  . Lack of Transportation (Non-Medical): No  Physical Activity: Inactive  . Days of Exercise per Week: 0 days  . Minutes of Exercise per Session: 0 min  Stress: No Stress Concern Present  . Feeling of Stress : Not at all  Social Connections: Socially Isolated  . Frequency of Communication with Friends and Family: More than three times a week  . Frequency of Social Gatherings with Friends and Family: Twice a week  . Attends Religious Services: Never  . Active Member of Clubs or Organizations: No  . Attends Banker Meetings: Never  . Marital Status: Widowed    Activities of Daily Living In your present state of health, do you have any difficulty performing the following activities: 11/06/2020  Hearing? Y  Comment hearing aids are not working at this time.  Vision? Y  Comment having trouble with vision.  Difficulty concentrating or making decisions? N  Walking or climbing stairs? Y  Comment recent fall 6 weeks ago; using a cane.  Dressing or bathing? Y  Doing errands, shopping? Y  Comment her grand son takes her to the appointments and her daughter helps with the groceries  Preparing Food and eating ? N  Using the Toilet? N  In the past six months, have you accidently leaked urine? Y  Do you have problems with loss of bowel control? N  Managing your Medications? N  Managing your Finances? N  Housekeeping or managing your Housekeeping? Y  Comment recent fall 6 weeks ago; using a cane.  Some recent data might be hidden    Patient Education/Literacy How often do you  need to have someone help you when you read instructions, pamphlets, or other written materials from your doctor or pharmacy?: 1 - Never What is the last grade level you completed in school?: GED  Exercise Current Exercise Habits: The patient does not participate in regular exercise at present, Exercise limited by: orthopedic condition(s)  Diet Patient reports consuming 3 meals a day and 1 snack(s) a day Patient reports that her primary diet is: Regular Patient reports that she does have regular access to food.   Depression Screen PHQ 2/9 Scores 11/06/2020 01/02/2020 03/20/2019 11/14/2018 08/09/2017 02/11/2016 01/02/2015  PHQ - 2 Score 0 1 0 1 1 0  0  PHQ- 9 Score - 4 - - - - -     Fall Risk Fall Risk  11/06/2020 04/02/2020 03/20/2019 08/18/2018 08/09/2017  Falls in the past year? 1 0 1 1 No  Comment - - - - -  Number falls in past yr: 0 - 1 0 -  Injury with Fall? 1 - 0 1 -  Risk for fall due to : Impaired mobility No Fall Risks Impaired balance/gait History of fall(s) -  Risk for fall due to: Comment - - - - -  Follow up Falls evaluation completed;Education provided;Falls prevention discussed - Falls prevention discussed Falls prevention discussed -     Objective:   BP (!) 149/63 (BP Location: Right Arm, Patient Position: Sitting, Cuff Size: Normal)   Pulse (!) 59   Resp 16   Ht 5' (1.524 m)   Wt 114 lb 1.3 oz (51.7 kg)   SpO2 98%   BMI 22.28 kg/m   Last Weight  Most recent update: 11/06/2020 10:03 AM   Weight  51.7 kg (114 lb 1.3 oz)            Body mass index is 22.28 kg/m.  Hearing/Vision  . Tina Graham did  have difficulty with hearing/understanding during the face-to-face interview. She was not wearing her hearing aids. Tina Graham. Tina Graham did not have difficulty with her vision during the face-to-face interview . Reports that she has not had a formal eye exam by an eye care professional within the past year . Reports that she has not had a formal hearing evaluation within the past  year  Cognitive Function: 6CIT Screen 11/06/2020 03/20/2019  What Year? 0 points 0 points  What month? 0 points 0 points  What time? 0 points 0 points  Count back from 20 0 points 0 points  Months in reverse 2 points 0 points  Repeat phrase 2 points -  Total Score 4 -    Normal Cognitive Function Screening: Yes (Normal:0-7, Significant for Dysfunction: >8)  Immunization & Health Maintenance Record Immunization History  Administered Date(s) Administered  . Influenza Split 04/22/2011  . Influenza Whole 04/30/2008, 07/08/2009, 07/08/2010  . Influenza, High Dose Seasonal PF 06/15/2016, 06/30/2017, 05/27/2018  . Influenza,inj,Quad PF,6+ Mos 06/08/2013, 05/06/2015  . Influenza-Unspecified 06/08/2014, 05/16/2019, 05/25/2020  . Janssen (J&J) SARS-COV-2 Vaccination 11/04/2019  . Moderna SARS-COV2 Booster Vaccination 07/31/2020  . Pneumococcal Conjugate-13 02/04/2017  . Pneumococcal Polysaccharide-23 08/03/2005  . Pneumococcal-Unspecified 08/03/2005  . Td 04/17/2005  . Tdap 06/28/2016    Health Maintenance  Topic Date Due  . MAMMOGRAM  10/03/2023 (Originally 02/07/2015)  . INFLUENZA VACCINE  03/03/2021  . TETANUS/TDAP  06/28/2026  . DEXA SCAN  Completed  . COVID-19 Vaccine  Completed  . PNA vac Low Risk Adult  Completed  . HPV VACCINES  Aged Out       Assessment  This is a routine wellness examination for Tina Graham.  Health Maintenance: Due or Overdue There are no preventive care reminders to display for this patient.  Tina Graham does not need a referral for [employer] Assistance: Care Management:   no Social Work:    no Prescription Assistance:  no Nutrition/Diabetes Education:  no   Plan:  Personalized Goals Goals Addressed              This Visit's Progress   .  Patient Stated (pt-stated)        11/06/2020 AWV Goal: Exercise for General Health   Patient will verbalize understanding of the benefits  of increased physical activity:  Exercising  regularly is important. It will improve your overall fitness, flexibility, and endurance.  Regular exercise also will improve your overall health. It can help you control your weight, reduce stress, and improve your bone density.  Over the next year, patient will increase physical activity as tolerated with a goal of at least 150 minutes of moderate physical activity per week.   You can tell that you are exercising at a moderate intensity if your heart starts beating faster and you start breathing faster but can still hold a conversation.  Moderate-intensity exercise ideas include:  Walking 1 mile (1.6 km) in about 15 minutes  Biking  Hiking  Golfing  Dancing  Water aerobics  Patient will verbalize understanding of everyday activities that increase physical activity by providing examples like the following: ? Yard work, such as: ? Pushing a Surveyor, mining ? Raking and bagging leaves ? Washing your car ? Pushing a stroller ? Shoveling snow ? Gardening ? Washing windows or floors  Patient will be able to explain general safety guidelines for exercising:   Before you start a new exercise program, talk with your health care provider.  Do not exercise so much that you hurt yourself, feel dizzy, or get very short of breath.  Wear comfortable clothes and wear shoes with good support.  Drink plenty of water while you exercise to prevent dehydration or heat stroke.  Work out until your breathing and your heartbeat get faster.       Personalized Health Maintenance & Screening Recommendations  Bone densitometry screening  Shingrix  Lung Cancer Screening Recommended: no (Low Dose CT Chest recommended if Age 7-80 years, 30 pack-year currently smoking OR have quit w/in past 15 years) Hepatitis C Screening recommended: no HIV Screening recommended: no  Advanced Directives: Written information was not given per the patient's request.  Referrals & Orders No orders of the defined  types were placed in this encounter.   Follow-up Plan . Follow-up with Agapito Games, MD as planned . Schedule your eye exam and your hearing exam. . Let us know if you change your mind about your bone exam. . Medicare wellness exam.   I have personally reviewed and noted the following in the patient's chart:   . Medical and social history . Use of alcohol, tobacco or illicit drugs  . Current medications and supplements . Functional ability and status . Nutritional status . Physical activity . Advanced directives . List of other physicians . Hospitalizations, surgeries, and ER visits in previous 12 months . Vitals . Screenings to include cognitive, depression, and falls . Referrals and appointments  In addition, I have reviewed and discussed with patient certain preventive protocols, quality metrics, and best practice recommendations. A written personalized care plan for preventive services as well as general preventive health recommendations were provided to patient.     Modesto Charon, RN  11/06/2020

## 2020-11-06 NOTE — Patient Instructions (Addendum)
MEDICARE ANNUAL WELLNESS VISIT Health Maintenance Summary and Written Plan of Care  Ms. Tina Graham ,  Thank you for allowing me to perform your Medicare Annual Wellness Visit and for your ongoing commitment to your health.   Health Maintenance & Immunization History Health Maintenance  Topic Date Due  . MAMMOGRAM  10/03/2023 (Originally 02/07/2015)  . INFLUENZA VACCINE  03/03/2021  . TETANUS/TDAP  06/28/2026  . DEXA SCAN  Completed  . COVID-19 Vaccine  Completed  . PNA vac Low Risk Adult  Completed  . HPV VACCINES  Aged Out   Immunization History  Administered Date(s) Administered  . Influenza Split 04/22/2011  . Influenza Whole 04/30/2008, 07/08/2009, 07/08/2010  . Influenza, High Dose Seasonal PF 06/15/2016, 06/30/2017, 05/27/2018  . Influenza,inj,Quad PF,6+ Mos 06/08/2013, 05/06/2015  . Influenza-Unspecified 06/08/2014, 05/16/2019, 05/25/2020  . Janssen (J&J) SARS-COV-2 Vaccination 11/04/2019  . Moderna SARS-COV2 Booster Vaccination 07/31/2020  . Pneumococcal Conjugate-13 02/04/2017  . Pneumococcal Polysaccharide-23 08/03/2005  . Pneumococcal-Unspecified 08/03/2005  . Td 04/17/2005  . Tdap 06/28/2016    These are the patient goals that we discussed: Goals Addressed              This Visit's Progress   .  Patient Stated (pt-stated)        11/06/2020 AWV Goal: Exercise for General Health   Patient will verbalize understanding of the benefits of increased physical activity:  Exercising regularly is important. It will improve your overall fitness, flexibility, and endurance.  Regular exercise also will improve your overall health. It can help you control your weight, reduce stress, and improve your bone density.  Over the next year, patient will increase physical activity as tolerated with a goal of at least 150 minutes of moderate physical activity per week.   You can tell that you are exercising at a moderate intensity if your heart starts beating faster and you  start breathing faster but can still hold a conversation.  Moderate-intensity exercise ideas include:  Walking 1 mile (1.6 km) in about 15 minutes  Biking  Hiking  Golfing  Dancing  Water aerobics  Patient will verbalize understanding of everyday activities that increase physical activity by providing examples like the following: ? Yard work, such as: ? Pushing a Surveyor, mining ? Raking and bagging leaves ? Washing your car ? Pushing a stroller ? Shoveling snow ? Gardening ? Washing windows or floors  Patient will be able to explain general safety guidelines for exercising:   Before you start a new exercise program, talk with your health care provider.  Do not exercise so much that you hurt yourself, feel dizzy, or get very short of breath.  Wear comfortable clothes and wear shoes with good support.  Drink plenty of water while you exercise to prevent dehydration or heat stroke.  Work out until your breathing and your heartbeat get faster.         This is a list of Health Maintenance Items that are overdue or due now: Bone densitometry screening  Shingrix  Orders/Referrals Placed Today: No orders of the defined types were placed in this encounter.  (Contact our referral department at [phone] if you have not spoken with someone about your referral appointment within the next 5 days)    Follow-up Plan . Follow-up with Agapito Games, MD as planned . Schedule your eye exam and your hearing exam. . Let us know if you change your mind about your bone exam. . Medicare wellness exam.  Health Maintenance, Female Adopting a healthy lifestyle and getting preventive care are important in promoting health and wellness. Ask your health care provider about:  The right schedule for you to have regular tests and exams.  Things you can do on your own to prevent diseases and keep yourself healthy. What should I know about diet, weight, and exercise? Eat  a healthy diet  Eat a diet that includes plenty of vegetables, fruits, low-fat dairy products, and lean protein.  Do not eat a lot of foods that are high in solid fats, added sugars, or sodium.   Maintain a healthy weight Body mass index (BMI) is used to identify weight problems. It estimates body fat based on height and weight. Your health care provider can help determine your BMI and help you achieve or maintain a healthy weight. Get regular exercise Get regular exercise. This is one of the most important things you can do for your health. Most adults should:  Exercise for at least 150 minutes each week. The exercise should increase your heart rate and make you sweat (moderate-intensity exercise).  Do strengthening exercises at least twice a week. This is in addition to the moderate-intensity exercise.  Spend less time sitting. Even light physical activity can be beneficial. Watch cholesterol and blood lipids Have your blood tested for lipids and cholesterol at 85 years of age, then have this test every 5 years. Have your cholesterol levels checked more often if:  Your lipid or cholesterol levels are high.  You are older than 85 years of age.  You are at high risk for heart disease. What should I know about cancer screening? Depending on your health history and family history, you may need to have cancer screening at various ages. This may include screening for:  Breast cancer.  Cervical cancer.  Colorectal cancer.  Skin cancer.  Lung cancer. What should I know about heart disease, diabetes, and high blood pressure? Blood pressure and heart disease  High blood pressure causes heart disease and increases the risk of stroke. This is more likely to develop in people who have high blood pressure readings, are of African descent, or are overweight.  Have your blood pressure checked: ? Every 3-5 years if you are 70-58 years of age. ? Every year if you are 54 years old or  older. Diabetes Have regular diabetes screenings. This checks your fasting blood sugar level. Have the screening done:  Once every three years after age 28 if you are at a normal weight and have a low risk for diabetes.  More often and at a younger age if you are overweight or have a high risk for diabetes. What should I know about preventing infection? Hepatitis B If you have a higher risk for hepatitis B, you should be screened for this virus. Talk with your health care provider to find out if you are at risk for hepatitis B infection. Hepatitis C Testing is recommended for:  Everyone born from [birth year] through 1963.  Anyone with known risk factors for hepatitis C. Sexually transmitted infections (STIs)  Get screened for STIs, including gonorrhea and chlamydia, if: ? You are sexually active and are younger than 85 years of age. ? You are older than 85 years of age and your health care provider tells you that you are at risk for this type of infection. ? Your sexual activity has changed since you were last screened, and you are at increased risk for chlamydia or gonorrhea. Ask your health care  provider if you are at risk.  Ask your health care provider about whether you are at high risk for HIV. Your health care provider may recommend a prescription medicine to help prevent HIV infection. If you choose to take medicine to prevent HIV, you should first get tested for HIV. You should then be tested every 3 months for as long as you are taking the medicine. Pregnancy  If you are about to stop having your period (premenopausal) and you may become pregnant, seek counseling before you get pregnant.  Take 400 to 800 micrograms (mcg) of folic acid every day if you become pregnant.  Ask for birth control (contraception) if you want to prevent pregnancy. Osteoporosis and menopause Osteoporosis is a disease in which the bones lose minerals and strength with aging. This can result in bone fractures.  If you are 49 years old or older, or if you are at risk for osteoporosis and fractures, ask your health care provider if you should:  Be screened for bone loss.  Take a calcium or vitamin D supplement to lower your risk of fractures.  Be given hormone replacement therapy (HRT) to treat symptoms of menopause. Follow these instructions at home: Lifestyle  Do not use any products that contain nicotine or tobacco, such as cigarettes, e-cigarettes, and chewing tobacco. If you need help quitting, ask your health care provider.  Do not use street drugs.  Do not share needles.  Ask your health care provider for help if you need support or information about quitting drugs. Alcohol use  Do not drink alcohol if: ? Your health care provider tells you not to drink. ? You are pregnant, may be pregnant, or are planning to become pregnant.  If you drink alcohol: ? Limit how much you use to 0-1 drink a day. ? Limit intake if you are breastfeeding.  Be aware of how much alcohol is in your drink. In the [place] one drink equals one 12 oz bottle of beer (355 mL), one 5 oz glass of wine (148 mL), or one 1 oz glass of hard liquor (44 mL). General instructions  Schedule regular health, dental, and eye exams.  Stay current with your vaccines.  Tell your health care provider if: ? You often feel depressed. ? You have ever been abused or do not feel safe at home. Summary  Adopting a healthy lifestyle and getting preventive care are important in promoting health and wellness.  Follow your health care provider's instructions about healthy diet, exercising, and getting tested or screened for diseases.  Follow your health care provider's instructions on monitoring your cholesterol and blood pressure. This information is not intended to replace advice given to you by your health care provider. Make sure you discuss any questions you have with your health care provider. Document Revised: 07/13/2018  Document Reviewed: 07/13/2018 Elsevier Patient Education  2021 [employer].

## 2020-11-07 DIAGNOSIS — Z741 Need for assistance with personal care: Secondary | ICD-10-CM | POA: Diagnosis not present

## 2020-11-07 DIAGNOSIS — D649 Anemia, unspecified: Secondary | ICD-10-CM | POA: Diagnosis not present

## 2020-11-07 DIAGNOSIS — E039 Hypothyroidism, unspecified: Secondary | ICD-10-CM | POA: Diagnosis not present

## 2020-11-07 DIAGNOSIS — S72141D Displaced intertrochanteric fracture of right femur, subsequent encounter for closed fracture with routine healing: Secondary | ICD-10-CM | POA: Diagnosis not present

## 2020-11-07 DIAGNOSIS — Z9181 History of falling: Secondary | ICD-10-CM | POA: Diagnosis not present

## 2020-11-07 DIAGNOSIS — I480 Paroxysmal atrial fibrillation: Secondary | ICD-10-CM | POA: Diagnosis not present

## 2020-11-07 DIAGNOSIS — K219 Gastro-esophageal reflux disease without esophagitis: Secondary | ICD-10-CM | POA: Diagnosis not present

## 2020-11-07 DIAGNOSIS — E785 Hyperlipidemia, unspecified: Secondary | ICD-10-CM | POA: Diagnosis not present

## 2020-11-07 DIAGNOSIS — I10 Essential (primary) hypertension: Secondary | ICD-10-CM | POA: Diagnosis not present

## 2020-11-07 LAB — BASIC METABOLIC PANEL WITH GFR
BUN: 17 mg/dL (ref 7–25)
CO2: 29 mmol/L (ref 20–32)
Calcium: 9.8 mg/dL (ref 8.6–10.4)
Chloride: 103 mmol/L (ref 98–110)
Creat: 0.83 mg/dL (ref 0.60–0.88)
GFR, Est African American: 72 mL/min/{1.73_m2} (ref >=60)
GFR, Est Non African American: 62 mL/min/{1.73_m2} (ref >=60)
Glucose, Bld: 97 mg/dL (ref 65–99)
Potassium: 4.4 mmol/L (ref 3.5–5.3)
Sodium: 139 mmol/L (ref 135–146)

## 2020-11-07 LAB — CBC
HCT: 43.3 % (ref 35.0–45.0)
Hemoglobin: 14 g/dL (ref 11.7–15.5)
MCH: 30 pg (ref 27.0–33.0)
MCHC: 32.3 g/dL (ref 32.0–36.0)
MCV: 92.9 fL (ref 80.0–100.0)
MPV: 9.7 fL (ref 7.5–12.5)
Platelets: 265 10*3/uL (ref 140–400)
RBC: 4.66 10*6/uL (ref 3.80–5.10)
RDW: 12.3 % (ref 11.0–15.0)
WBC: 8.9 10*3/uL (ref 3.8–10.8)

## 2020-11-07 LAB — TSH: TSH: 6.69 mIU/L — ABNORMAL HIGH (ref 0.40–4.50)

## 2020-11-09 DIAGNOSIS — I10 Essential (primary) hypertension: Secondary | ICD-10-CM | POA: Diagnosis not present

## 2020-11-09 DIAGNOSIS — Z9181 History of falling: Secondary | ICD-10-CM | POA: Diagnosis not present

## 2020-11-09 DIAGNOSIS — K219 Gastro-esophageal reflux disease without esophagitis: Secondary | ICD-10-CM | POA: Diagnosis not present

## 2020-11-09 DIAGNOSIS — Z741 Need for assistance with personal care: Secondary | ICD-10-CM | POA: Diagnosis not present

## 2020-11-09 DIAGNOSIS — I480 Paroxysmal atrial fibrillation: Secondary | ICD-10-CM | POA: Diagnosis not present

## 2020-11-09 DIAGNOSIS — E785 Hyperlipidemia, unspecified: Secondary | ICD-10-CM | POA: Diagnosis not present

## 2020-11-09 DIAGNOSIS — S72141D Displaced intertrochanteric fracture of right femur, subsequent encounter for closed fracture with routine healing: Secondary | ICD-10-CM | POA: Diagnosis not present

## 2020-11-09 DIAGNOSIS — D649 Anemia, unspecified: Secondary | ICD-10-CM | POA: Diagnosis not present

## 2020-11-09 DIAGNOSIS — E039 Hypothyroidism, unspecified: Secondary | ICD-10-CM | POA: Diagnosis not present

## 2020-11-11 NOTE — Telephone Encounter (Signed)
error 

## 2020-11-15 DIAGNOSIS — I480 Paroxysmal atrial fibrillation: Secondary | ICD-10-CM | POA: Diagnosis not present

## 2020-11-15 DIAGNOSIS — E785 Hyperlipidemia, unspecified: Secondary | ICD-10-CM | POA: Diagnosis not present

## 2020-11-15 DIAGNOSIS — K219 Gastro-esophageal reflux disease without esophagitis: Secondary | ICD-10-CM | POA: Diagnosis not present

## 2020-11-15 DIAGNOSIS — D649 Anemia, unspecified: Secondary | ICD-10-CM | POA: Diagnosis not present

## 2020-11-15 DIAGNOSIS — E039 Hypothyroidism, unspecified: Secondary | ICD-10-CM | POA: Diagnosis not present

## 2020-11-15 DIAGNOSIS — I10 Essential (primary) hypertension: Secondary | ICD-10-CM | POA: Diagnosis not present

## 2020-11-15 DIAGNOSIS — Z9181 History of falling: Secondary | ICD-10-CM | POA: Diagnosis not present

## 2020-11-15 DIAGNOSIS — S72141D Displaced intertrochanteric fracture of right femur, subsequent encounter for closed fracture with routine healing: Secondary | ICD-10-CM | POA: Diagnosis not present

## 2020-11-15 DIAGNOSIS — Z741 Need for assistance with personal care: Secondary | ICD-10-CM | POA: Diagnosis not present

## 2020-11-19 DIAGNOSIS — Z9181 History of falling: Secondary | ICD-10-CM | POA: Diagnosis not present

## 2020-11-19 DIAGNOSIS — S72141D Displaced intertrochanteric fracture of right femur, subsequent encounter for closed fracture with routine healing: Secondary | ICD-10-CM | POA: Diagnosis not present

## 2020-11-19 DIAGNOSIS — Z741 Need for assistance with personal care: Secondary | ICD-10-CM | POA: Diagnosis not present

## 2020-11-19 DIAGNOSIS — I480 Paroxysmal atrial fibrillation: Secondary | ICD-10-CM | POA: Diagnosis not present

## 2020-11-19 DIAGNOSIS — E039 Hypothyroidism, unspecified: Secondary | ICD-10-CM | POA: Diagnosis not present

## 2020-11-19 DIAGNOSIS — K219 Gastro-esophageal reflux disease without esophagitis: Secondary | ICD-10-CM | POA: Diagnosis not present

## 2020-11-19 DIAGNOSIS — D649 Anemia, unspecified: Secondary | ICD-10-CM | POA: Diagnosis not present

## 2020-11-19 DIAGNOSIS — E785 Hyperlipidemia, unspecified: Secondary | ICD-10-CM | POA: Diagnosis not present

## 2020-11-19 DIAGNOSIS — I10 Essential (primary) hypertension: Secondary | ICD-10-CM | POA: Diagnosis not present

## 2020-11-20 ENCOUNTER — Other Ambulatory Visit: Payer: Self-pay | Admitting: Family Medicine

## 2020-11-20 DIAGNOSIS — I10 Essential (primary) hypertension: Secondary | ICD-10-CM

## 2020-11-21 DIAGNOSIS — E785 Hyperlipidemia, unspecified: Secondary | ICD-10-CM | POA: Diagnosis not present

## 2020-11-21 DIAGNOSIS — I10 Essential (primary) hypertension: Secondary | ICD-10-CM | POA: Diagnosis not present

## 2020-11-21 DIAGNOSIS — D649 Anemia, unspecified: Secondary | ICD-10-CM | POA: Diagnosis not present

## 2020-11-21 DIAGNOSIS — S72141D Displaced intertrochanteric fracture of right femur, subsequent encounter for closed fracture with routine healing: Secondary | ICD-10-CM | POA: Diagnosis not present

## 2020-11-21 DIAGNOSIS — E039 Hypothyroidism, unspecified: Secondary | ICD-10-CM | POA: Diagnosis not present

## 2020-11-21 DIAGNOSIS — K219 Gastro-esophageal reflux disease without esophagitis: Secondary | ICD-10-CM | POA: Diagnosis not present

## 2020-11-21 DIAGNOSIS — Z9181 History of falling: Secondary | ICD-10-CM | POA: Diagnosis not present

## 2020-11-21 DIAGNOSIS — I480 Paroxysmal atrial fibrillation: Secondary | ICD-10-CM | POA: Diagnosis not present

## 2020-11-21 DIAGNOSIS — Z741 Need for assistance with personal care: Secondary | ICD-10-CM | POA: Diagnosis not present

## 2020-11-28 ENCOUNTER — Other Ambulatory Visit: Payer: Self-pay | Admitting: Family Medicine

## 2020-11-28 DIAGNOSIS — E039 Hypothyroidism, unspecified: Secondary | ICD-10-CM

## 2020-12-03 ENCOUNTER — Other Ambulatory Visit: Payer: Self-pay

## 2020-12-03 ENCOUNTER — Ambulatory Visit (INDEPENDENT_AMBULATORY_CARE_PROVIDER_SITE_OTHER): Payer: Medicare HMO | Admitting: Family Medicine

## 2020-12-03 ENCOUNTER — Encounter: Payer: Self-pay | Admitting: Family Medicine

## 2020-12-03 VITALS — BP 134/62 | HR 60 | Ht 60.0 in | Wt 112.0 lb

## 2020-12-03 DIAGNOSIS — I10 Essential (primary) hypertension: Secondary | ICD-10-CM | POA: Diagnosis not present

## 2020-12-03 DIAGNOSIS — R413 Other amnesia: Secondary | ICD-10-CM | POA: Diagnosis not present

## 2020-12-03 DIAGNOSIS — Z8781 Personal history of (healed) traumatic fracture: Secondary | ICD-10-CM | POA: Diagnosis not present

## 2020-12-03 DIAGNOSIS — E039 Hypothyroidism, unspecified: Secondary | ICD-10-CM | POA: Diagnosis not present

## 2020-12-03 LAB — TSH+FREE T4: TSH W/REFLEX TO FT4: 3.89 mIU/L (ref 0.40–4.50)

## 2020-12-03 NOTE — Assessment & Plan Note (Signed)
She has completed physical therapy in her home.  We did discuss just continuing to do some of the stretches and exercises that they taught her with the water bottles and using her cane so that she continues to stay strong.  Her children are now doing some of her medications which is wonderful

## 2020-12-03 NOTE — Assessment & Plan Note (Signed)
I am glad to hear that her children are now helping with some of her medications.Still able to do her ADLs, though she really does not do any cooking anymore except for in the microwave.  She does try to drink a Ensure daily as well as V8 juice

## 2020-12-03 NOTE — Progress Notes (Signed)
Pt reports that she doesn't feel that her thyroid medication dosage is correct. She has been losing weight

## 2020-12-03 NOTE — Assessment & Plan Note (Signed)
We discussed that normally after a medication change we usually wait 6 to 8 weeks to recheck the TSH, but she does have transportation problems and would like to go ahead and do it today.  So we will get that done.  Call with results once available she is taking extra half of a tab 1 day a week.

## 2020-12-03 NOTE — Progress Notes (Signed)
Established Patient Office Visit  Subjective:  Patient ID: Tina Graham, female    DOB: 02-09-1929  Age: 87 y.o. MRN: [id number]  CC:  Chief Complaint  Patient presents with  . Hypertension    HPI Tina Graham presents for hypothyroidism.  She says she feels like her thyroid is off.  We tested her TSH on April 6 and it was 6.69.  We had her increase her dose by half of a tab 1 day a week.  She says in retrospect she thinks she may have messed up the medication when she first got home from the hospital so she is not sure if she may have missed a few tabs at that time or not.  But she has been taking it consistently for the last month and a half.  She she is mostly just struggling with fatigue.  Hypertension- Pt denies chest pain, SOB, dizziness, or heart palpitations.  Taking meds as directed w/o problems.  Denies medication side effects.   She has done well since her right hip fracture in December.  She says physical therapy is no longer coming out.   Past Medical History:  Diagnosis Date  . H. pylori infection    treated  . HOH (hard of hearing)   . Hyperlipidemia   . Hypertension   . Hypothyroidism     Past Surgical History:  Procedure Laterality Date  . APPENDECTOMY    . CHOLECYSTECTOMY    . THYROIDECTOMY     partial for goiter    Family History  Problem Relation Age of Onset  . Cancer Daughter   . Heart disease Sister        unknown specifics    Social History   Socioeconomic History  . Marital status: Widowed    Spouse name: Not on file  . Number of children: 5  . Years of education: 6  . Highest education level: GED or equivalent  Occupational History    Employer: RETIRED  Tobacco Use  . Smoking status: Former Games developer  . Smokeless tobacco: Never Used  Vaping Use  . Vaping Use: Never used  Substance and Sexual Activity  . Alcohol use: No  . Drug use: No  . Sexual activity: Not Currently    Comment: housewife, widowed, lives alone, regular  exercise  Other Topics Concern  . Not on file  Social History Narrative   Has back problems and sleeps a lot due to this- back pain. Watches tv in her free time.   Social Determinants of Health   Financial Resource Strain: Low Risk   . Difficulty of Paying Living Expenses: Not hard at all  Food Insecurity: No Food Insecurity  . Worried About Programme researcher, broadcasting/film/video in the Last Year: Never true  . Ran Out of Food in the Last Year: Never true  Transportation Needs: No Transportation Needs  . Lack of Transportation (Medical): No  . Lack of Transportation (Non-Medical): No  Physical Activity: Inactive  . Days of Exercise per Week: 0 days  . Minutes of Exercise per Session: 0 min  Stress: No Stress Concern Present  . Feeling of Stress : Not at all  Social Connections: Socially Isolated  . Frequency of Communication with Friends and Family: More than three times a week  . Frequency of Social Gatherings with Friends and Family: Twice a week  . Attends Religious Services: Never  . Active Member of Clubs or Organizations: No  . Attends Banker Meetings: Never  .  Marital Status: Widowed  Intimate Partner Violence: Not At Risk  . Fear of Current or Ex-Partner: No  . Emotionally Abused: No  . Physically Abused: No  . Sexually Abused: No    Outpatient Medications Prior to Visit  Medication Sig Dispense Refill  . amLODipine (NORVASC) 5 MG tablet TAKE 1 TABLET EVERY DAY 90 tablet 1  . atorvastatin (LIPITOR) 40 MG tablet TAKE 1 TABLET EVERY DAY 90 tablet 3  . carvedilol (COREG) 25 MG tablet     . digoxin (LANOXIN) 0.125 MG tablet Take by mouth.    . lamoTRIgine (LAMICTAL) 25 MG tablet Take 50 mg by mouth 2 (two) times daily.     Marland Kitchen levothyroxine (SYNTHROID) 100 MCG tablet TAKE 1 TABLET EVERY DAY 90 tablet 0  . Multiple Vitamins-Minerals (PRESERVISION AREDS 2 PO) Take by mouth.    . Omega-3 Fatty Acids (FISH OIL) 1000 MG CAPS Take 1,000 mg by mouth daily.    . pantoprazole  (PROTONIX) 40 MG tablet Take 1 tablet (40 mg total) by mouth daily. 90 tablet 3  . vitamin B-12 (CYANOCOBALAMIN) 1000 MCG tablet Take 1,000 mcg by mouth daily.    Marland Kitchen aspirin EC 81 MG tablet Take 81 mg by mouth daily. (Patient not taking: Reported on 11/06/2020)     No facility-administered medications prior to visit.    Allergies  Allergen Reactions  . Donepezil Other (See Comments)    nightmares  . Ibuprofen Other (See Comments)    Pt unable to recall reaction Patient is not sure what happens if she takes this medicine.  . Memantine Other (See Comments)    Nightmares   . Simvastatin Other (See Comments)    REACTION: myalgias    ROS Review of Systems    Objective:    Physical Exam Constitutional:      Appearance: She is well-developed.  HENT:     Head: Normocephalic and atraumatic.  Cardiovascular:     Rate and Rhythm: Normal rate and regular rhythm.     Heart sounds: Normal heart sounds.  Pulmonary:     Effort: Pulmonary effort is normal.     Breath sounds: Normal breath sounds.  Skin:    General: Skin is warm and dry.  Neurological:     Mental Status: She is alert and oriented to person, place, and time.  Psychiatric:        Behavior: Behavior normal.     BP (!) 162/62   Pulse 60   Ht 5' (1.524 m)   Wt 112 lb (50.8 kg)   SpO2 100%   BMI 21.87 kg/m  Wt Readings from Last 3 Encounters:  12/03/20 112 lb (50.8 kg)  11/06/20 114 lb 1.3 oz (51.7 kg)  09/05/20 116 lb (52.6 kg)     There are no preventive care reminders to display for this patient.  There are no preventive care reminders to display for this patient.  Lab Results  Component Value Date   TSH 6.69 (H) 11/06/2020   Lab Results  Component Value Date   WBC 8.9 11/06/2020   HGB 14.0 11/06/2020   HCT 43.3 11/06/2020   MCV 92.9 11/06/2020   PLT 265 11/06/2020   Lab Results  Component Value Date   NA 139 11/06/2020   K 4.4 11/06/2020   CO2 29 11/06/2020   GLUCOSE 97 11/06/2020   BUN 17  11/06/2020   CREATININE 0.83 11/06/2020   BILITOT 1.0 10/02/2019   ALKPHOS 98 11/05/2016   AST 17 10/02/2019  ALT 14 10/02/2019   PROT 6.4 10/02/2019   ALBUMIN 4.0 11/05/2016   CALCIUM 9.8 11/06/2020   Lab Results  Component Value Date   CHOL 114 10/02/2019   Lab Results  Component Value Date   HDL 51 10/02/2019   Lab Results  Component Value Date   LDLCALC 47 10/02/2019   Lab Results  Component Value Date   TRIG 76 10/02/2019   Lab Results  Component Value Date   CHOLHDL 2.2 10/02/2019   Lab Results  Component Value Date   HGBA1C 5.2 10/02/2019      Assessment & Plan:   Problem List Items Addressed This Visit      Cardiovascular and Mediastinum   HYPERTENSION, BENIGN     Endocrine   Hypothyroidism - Primary    We discussed that normally after a medication change we usually wait 6 to 8 weeks to recheck the TSH, but she does have transportation problems and would like to go ahead and do it today.  So we will get that done.  Call with results once available she is taking extra half of a tab 1 day a week.      Relevant Orders   TSH + free T4     Other   S/P right hip fracture    She has completed physical therapy in her home.  We did discuss just continuing to do some of the stretches and exercises that they taught her with the water bottles and using her cane so that she continues to stay strong.  Her children are now doing some of her medications which is wonderful      Impaired memory    I am glad to hear that her children are now helping with some of her medications.Still able to do her ADLs, though she really does not do any cooking anymore except for in the microwave.  She does try to drink a Ensure daily as well as V8 juice         No orders of the defined types were placed in this encounter.   Follow-up: Return in about 5 months (around 05/05/2021) for Hypertension and labs  .    Nani Gasser, MD

## 2020-12-05 NOTE — Progress Notes (Signed)
All labs are normal. 

## 2020-12-12 DIAGNOSIS — H5213 Myopia, bilateral: Secondary | ICD-10-CM | POA: Diagnosis not present

## 2020-12-12 DIAGNOSIS — H269 Unspecified cataract: Secondary | ICD-10-CM | POA: Diagnosis not present

## 2020-12-12 DIAGNOSIS — H5203 Hypermetropia, bilateral: Secondary | ICD-10-CM | POA: Diagnosis not present

## 2020-12-12 DIAGNOSIS — Z0100 Encounter for examination of eyes and vision without abnormal findings: Secondary | ICD-10-CM | POA: Diagnosis not present

## 2020-12-12 DIAGNOSIS — H524 Presbyopia: Secondary | ICD-10-CM | POA: Diagnosis not present

## 2020-12-12 DIAGNOSIS — H52209 Unspecified astigmatism, unspecified eye: Secondary | ICD-10-CM | POA: Diagnosis not present

## 2020-12-19 ENCOUNTER — Telehealth: Payer: Self-pay | Admitting: Family Medicine

## 2020-12-19 NOTE — Chronic Care Management (AMB) (Signed)
  Chronic Care Management   Outreach Note  12/19/2020 Name: Tina Graham MRN: [id number] DOB: Sep 09, 1928  Referred by: Agapito Games, MD Reason for referral : No chief complaint on file.   An unsuccessful telephone outreach was attempted today. The patient was referred to the pharmacist for assistance with care management and care coordination.   Follow Up Plan:   Carmell Austria Upstream Scheduler

## 2020-12-23 DIAGNOSIS — I4891 Unspecified atrial fibrillation: Secondary | ICD-10-CM | POA: Diagnosis not present

## 2021-01-02 ENCOUNTER — Telehealth: Payer: Self-pay | Admitting: Family Medicine

## 2021-01-02 NOTE — Progress Notes (Signed)
  Chronic Care Management   Outreach Note  01/02/2021 Name: SELENI MELLER MRN: [id number] DOB: 10-01-1928  Referred by: Agapito Games, MD Reason for referral : No chief complaint on file.   A second unsuccessful telephone outreach was attempted today. The patient was referred to pharmacist for assistance with care management and care coordination.  Follow Up Plan:   Carmell Austria Upstream Scheduler

## 2021-01-09 DIAGNOSIS — H35033 Hypertensive retinopathy, bilateral: Secondary | ICD-10-CM | POA: Diagnosis not present

## 2021-01-09 DIAGNOSIS — H353221 Exudative age-related macular degeneration, left eye, with active choroidal neovascularization: Secondary | ICD-10-CM | POA: Diagnosis not present

## 2021-01-09 DIAGNOSIS — H179 Unspecified corneal scar and opacity: Secondary | ICD-10-CM | POA: Diagnosis not present

## 2021-01-09 DIAGNOSIS — H353112 Nonexudative age-related macular degeneration, right eye, intermediate dry stage: Secondary | ICD-10-CM | POA: Diagnosis not present

## 2021-01-13 ENCOUNTER — Telehealth: Payer: Self-pay | Admitting: *Deleted

## 2021-01-13 NOTE — Telephone Encounter (Signed)
Called pt and advised her to call Carmell Austria to follow up [phone].

## 2021-01-15 DIAGNOSIS — I455 Other specified heart block: Secondary | ICD-10-CM | POA: Diagnosis not present

## 2021-01-15 DIAGNOSIS — Z950 Presence of cardiac pacemaker: Secondary | ICD-10-CM | POA: Diagnosis not present

## 2021-01-15 DIAGNOSIS — I495 Sick sinus syndrome: Secondary | ICD-10-CM | POA: Diagnosis not present

## 2021-01-15 DIAGNOSIS — I4891 Unspecified atrial fibrillation: Secondary | ICD-10-CM | POA: Diagnosis not present

## 2021-01-15 DIAGNOSIS — I480 Paroxysmal atrial fibrillation: Secondary | ICD-10-CM | POA: Diagnosis not present

## 2021-01-16 DIAGNOSIS — G3184 Mild cognitive impairment, so stated: Secondary | ICD-10-CM | POA: Diagnosis not present

## 2021-01-16 DIAGNOSIS — M5417 Radiculopathy, lumbosacral region: Secondary | ICD-10-CM | POA: Diagnosis not present

## 2021-01-16 DIAGNOSIS — G603 Idiopathic progressive neuropathy: Secondary | ICD-10-CM | POA: Diagnosis not present

## 2021-01-16 DIAGNOSIS — G5623 Lesion of ulnar nerve, bilateral upper limbs: Secondary | ICD-10-CM | POA: Diagnosis not present

## 2021-01-16 DIAGNOSIS — G40209 Localization-related (focal) (partial) symptomatic epilepsy and epileptic syndromes with complex partial seizures, not intractable, without status epilepticus: Secondary | ICD-10-CM | POA: Diagnosis not present

## 2021-01-16 DIAGNOSIS — R269 Unspecified abnormalities of gait and mobility: Secondary | ICD-10-CM | POA: Diagnosis not present

## 2021-01-16 DIAGNOSIS — M5412 Radiculopathy, cervical region: Secondary | ICD-10-CM | POA: Diagnosis not present

## 2021-01-16 DIAGNOSIS — G5603 Carpal tunnel syndrome, bilateral upper limbs: Secondary | ICD-10-CM | POA: Diagnosis not present

## 2021-01-17 DIAGNOSIS — H353221 Exudative age-related macular degeneration, left eye, with active choroidal neovascularization: Secondary | ICD-10-CM | POA: Diagnosis not present

## 2021-02-14 DIAGNOSIS — H353221 Exudative age-related macular degeneration, left eye, with active choroidal neovascularization: Secondary | ICD-10-CM | POA: Diagnosis not present

## 2021-02-14 DIAGNOSIS — H353112 Nonexudative age-related macular degeneration, right eye, intermediate dry stage: Secondary | ICD-10-CM | POA: Diagnosis not present

## 2021-02-18 ENCOUNTER — Other Ambulatory Visit: Payer: Self-pay | Admitting: Family Medicine

## 2021-02-18 DIAGNOSIS — F411 Generalized anxiety disorder: Secondary | ICD-10-CM

## 2021-03-19 DIAGNOSIS — H353221 Exudative age-related macular degeneration, left eye, with active choroidal neovascularization: Secondary | ICD-10-CM | POA: Diagnosis not present

## 2021-04-16 DIAGNOSIS — H35033 Hypertensive retinopathy, bilateral: Secondary | ICD-10-CM | POA: Diagnosis not present

## 2021-04-16 DIAGNOSIS — H353112 Nonexudative age-related macular degeneration, right eye, intermediate dry stage: Secondary | ICD-10-CM | POA: Diagnosis not present

## 2021-04-16 DIAGNOSIS — H353221 Exudative age-related macular degeneration, left eye, with active choroidal neovascularization: Secondary | ICD-10-CM | POA: Diagnosis not present

## 2021-04-16 DIAGNOSIS — H179 Unspecified corneal scar and opacity: Secondary | ICD-10-CM | POA: Diagnosis not present

## 2021-04-18 ENCOUNTER — Other Ambulatory Visit: Payer: Self-pay | Admitting: Family Medicine

## 2021-04-18 DIAGNOSIS — F411 Generalized anxiety disorder: Secondary | ICD-10-CM

## 2021-04-19 DIAGNOSIS — I4891 Unspecified atrial fibrillation: Secondary | ICD-10-CM | POA: Diagnosis not present

## 2021-04-23 ENCOUNTER — Other Ambulatory Visit: Payer: Self-pay | Admitting: Family Medicine

## 2021-04-23 DIAGNOSIS — I10 Essential (primary) hypertension: Secondary | ICD-10-CM

## 2021-05-05 ENCOUNTER — Encounter: Payer: Self-pay | Admitting: Family Medicine

## 2021-05-05 ENCOUNTER — Other Ambulatory Visit: Payer: Self-pay

## 2021-05-05 ENCOUNTER — Ambulatory Visit (INDEPENDENT_AMBULATORY_CARE_PROVIDER_SITE_OTHER): Payer: Medicare HMO | Admitting: Family Medicine

## 2021-05-05 VITALS — BP 152/59 | HR 60 | Temp 98.5°F | Ht 60.0 in | Wt 115.0 lb

## 2021-05-05 DIAGNOSIS — I10 Essential (primary) hypertension: Secondary | ICD-10-CM

## 2021-05-05 DIAGNOSIS — F411 Generalized anxiety disorder: Secondary | ICD-10-CM | POA: Diagnosis not present

## 2021-05-05 DIAGNOSIS — D692 Other nonthrombocytopenic purpura: Secondary | ICD-10-CM | POA: Diagnosis not present

## 2021-05-05 DIAGNOSIS — E039 Hypothyroidism, unspecified: Secondary | ICD-10-CM

## 2021-05-05 DIAGNOSIS — Z8781 Personal history of (healed) traumatic fracture: Secondary | ICD-10-CM

## 2021-05-05 DIAGNOSIS — E785 Hyperlipidemia, unspecified: Secondary | ICD-10-CM

## 2021-05-05 DIAGNOSIS — Z23 Encounter for immunization: Secondary | ICD-10-CM | POA: Diagnosis not present

## 2021-05-05 MED ORDER — ALPRAZOLAM 1 MG PO TABS: ORAL_TABLET | ORAL | 1 refills | Status: DC

## 2021-05-05 NOTE — Assessment & Plan Note (Signed)
She does feel like her leg is shorter but she goes barefoot all the time.  We discussed that we could get her to ortho/sports med for a heel lift. She will consider it.

## 2021-05-05 NOTE — Patient Instructions (Signed)
Influenza (Flu) Vaccine (Inactivated or Recombinant): What You Need to Know 1. Why get vaccinated? Influenza vaccine can prevent influenza (flu). Flu is a contagious disease that spreads around the [place] every year, usually between October and May. Anyone can get the flu, but it is more dangerous for some people. Infants and young children, people 65 years and older, pregnant people, and people with certain health conditions or a weakened immune system are at greatest risk of flu complications. Pneumonia, bronchitis, sinus infections, and ear infections are examples of flu-related complications. If you have a medical condition, such as heart disease, cancer, or diabetes, flu can make it worse. Flu can cause fever and chills, sore throat, muscle aches, fatigue, cough, headache, and runny or stuffy nose. Some people may have vomiting and diarrhea, though this is more common in children than adults. In an average year, thousands of people in the [place] die from flu, and many more are hospitalized. Flu vaccine prevents millions of illnesses and flu-related visits to the doctor each year. 2. Influenza vaccines CDC recommends everyone 6 months and older get vaccinated every flu season. Children 6 months through 8 years of age may need 2 doses during a single flu season. Everyone else needs only 1 dose each flu season. It takes about 2 weeks for protection to develop after vaccination. There are many flu viruses, and they are always changing. Each year a new flu vaccine is made to protect against the influenza viruses believed to be likely to cause disease in the upcoming flu season. Even when the vaccine doesn't exactly match these viruses, it may still provide some protection. Influenza vaccine does not cause flu. Influenza vaccine may be given at the same time as other vaccines. 3. Talk with your health care provider Tell your vaccination provider if the person getting the vaccine: Has had  an allergic reaction after a previous dose of influenza vaccine, or has any severe, life-threatening allergies Has ever had Guillain-Barr Syndrome (also called "GBS") In some cases, your health care provider may decide to postpone influenza vaccination until a future visit. Influenza vaccine can be administered at any time during pregnancy. People who are or will be pregnant during influenza season should receive inactivated influenza vaccine. People with minor illnesses, such as a cold, may be vaccinated. People who are moderately or severely ill should usually wait until they recover before getting influenza vaccine. Your health care provider can give you more information. 4. Risks of a vaccine reaction Soreness, redness, and swelling where the shot is given, fever, muscle aches, and headache can happen after influenza vaccination. There may be a very small increased risk of Guillain-Barr Syndrome (GBS) after inactivated influenza vaccine (the flu shot). Young children who get the flu shot along with pneumococcal vaccine (PCV13) and/or DTaP vaccine at the same time might be slightly more likely to have a seizure caused by fever. Tell your health care provider if a child who is getting flu vaccine has ever had a seizure. People sometimes faint after medical procedures, including vaccination. Tell your provider if you feel dizzy or have vision changes or ringing in the ears. As with any medicine, there is a very remote chance of a vaccine causing a severe allergic reaction, other serious injury, or death. 5. What if there is a serious problem? An allergic reaction could occur after the vaccinated person leaves the clinic. If you see signs of a severe allergic reaction (hives, swelling of the face and throat, difficulty breathing,   a fast heartbeat, dizziness, or weakness), call 9-1-1 and get the person to the nearest hospital. For other signs that concern you, call your health care provider. Adverse  reactions should be reported to the Vaccine Adverse Event Reporting System (VAERS). Your health care provider will usually file this report, or you can do it yourself. Visit the VAERS website at www.vaers.hhs.gov or call 1-800-822-7967. VAERS is only for reporting reactions, and VAERS staff members do not give medical advice. 6. The National Vaccine Injury Compensation Program The National Vaccine Injury Compensation Program (VICP) is a federal program that was created to compensate people who may have been injured by certain vaccines. Claims regarding alleged injury or death due to vaccination have a time limit for filing, which may be as short as two years. Visit the VICP website at www.hrsa.gov/vaccinecompensation or call 1-800-338-2382 to learn about the program and about filing a claim. 7. How can I learn more? Ask your health care provider. Call your local or state health department. Visit the website of the Food and Drug Administration (FDA) for vaccine package inserts and additional information at www.fda.gov/vaccines-blood-biologics/vaccines. Contact the Centers for Disease Control and Prevention (CDC): Call 1-800-232-4636 (1-800-CDC-INFO) or Visit CDC's website at www.cdc.gov/flu. Vaccine Information Statement Inactivated Influenza Vaccine (03/08/2020) This information is not intended to replace advice given to you by your health care provider. Make sure you discuss any questions you have with your health care provider. Document Revised: 04/25/2020 Document Reviewed: 04/25/2020 Elsevier Patient Education  2022 Elsevier Inc.  

## 2021-05-05 NOTE — Progress Notes (Signed)
Established Patient Office Visit  Subjective:  Patient ID: Tina Graham, female    DOB: 03/25/1929  Age: 87 y.o. MRN: [id number]  CC:  Chief Complaint  Patient presents with   Hypertension   Hyperlipidemia   Hypothyroidism   Anxiety    HPI Tina Graham presents for   Hypothyroidism - Taking medication regularly in the AM away from food and vitamins, etc. No recent change to skin, hair, or energy levels.  Last TSH was elevated but she had missed some of her doses after getting out of the hospital and felt like that was contributing so she is due to recheck those levels.  Also received notification from the pharmacy that the Protonix she is taking could interfere with her digoxin and would like to have that changed.  She says her daughter passed away last week from a GI bleed and cancer so she is concerned about her own blood count and hemoglobin would like to have that checked when she gets her lab work done today.  Past Medical History:  Diagnosis Date   H. pylori infection    treated   HOH (hard of hearing)    Hyperlipidemia    Hypertension    Hypothyroidism     Past Surgical History:  Procedure Laterality Date   APPENDECTOMY     CHOLECYSTECTOMY     THYROIDECTOMY     partial for goiter    Family History  Problem Relation Age of Onset   Cancer Daughter    Heart disease Sister        unknown specifics    Social History   Socioeconomic History   Marital status: Widowed    Spouse name: Not on file   Number of children: 5   Years of education: 6   Highest education level: GED or equivalent  Occupational History    Employer: RETIRED  Tobacco Use   Smoking status: Former   Smokeless tobacco: Never  Building services engineer Use: Never used  Substance and Sexual Activity   Alcohol use: No   Drug use: No   Sexual activity: Not Currently    Comment: housewife, widowed, lives alone, regular exercise  Other Topics Concern   Not on file  Social History  Narrative   Has back problems and sleeps a lot due to this- back pain. Watches tv in her free time.   Social Determinants of Health   Financial Resource Strain: Low Risk    Difficulty of Paying Living Expenses: Not hard at all  Food Insecurity: No Food Insecurity   Worried About Programme researcher, broadcasting/film/video in the Last Year: Never true   Ran Out of Food in the Last Year: Never true  Transportation Needs: No Transportation Needs   Lack of Transportation (Medical): No   Lack of Transportation (Non-Medical): No  Physical Activity: Inactive   Days of Exercise per Week: 0 days   Minutes of Exercise per Session: 0 min  Stress: No Stress Concern Present   Feeling of Stress : Not at all  Social Connections: Socially Isolated   Frequency of Communication with Friends and Family: More than three times a week   Frequency of Social Gatherings with Friends and Family: Twice a week   Attends Religious Services: Never   Database administrator or Organizations: No   Attends Banker Meetings: Never   Marital Status: Widowed  Intimate Partner Violence: Not At Risk   Fear of Current or Ex-Partner: No  Emotionally Abused: No   Physically Abused: No   Sexually Abused: No    Outpatient Medications Prior to Visit  Medication Sig Dispense Refill   amLODipine (NORVASC) 5 MG tablet TAKE 1 TABLET EVERY DAY 90 tablet 1   aspirin EC 81 MG tablet Take 81 mg by mouth daily. Swallow whole.     atorvastatin (LIPITOR) 40 MG tablet TAKE 1 TABLET EVERY DAY 90 tablet 3   CALCIUM-VITAMIN D PO Take 1 tablet by mouth 2 (two) times daily.     carvedilol (COREG) 25 MG tablet      digoxin (LANOXIN) 0.125 MG tablet Take by mouth.     lamoTRIgine (LAMICTAL) 25 MG tablet Take 50 mg by mouth 2 (two) times daily.      levothyroxine (SYNTHROID) 100 MCG tablet TAKE 1 TABLET EVERY DAY 90 tablet 0   Multiple Vitamins-Minerals (PRESERVISION AREDS 2 PO) Take by mouth.     Omega-3 Fatty Acids (FISH OIL) 1000 MG CAPS Take  1,000 mg by mouth daily.     pantoprazole (PROTONIX) 40 MG tablet Take 1 tablet (40 mg total) by mouth daily. 90 tablet 3   Selenium (SELENIMIN PO) Take by mouth.     SELENIUM SULFIDE EX Apply topically.     UNABLE TO FIND Med Name: Vitamin O     UNABLE TO FIND Med Name: All Day/Energy Greens     vitamin B-12 (CYANOCOBALAMIN) 1000 MCG tablet Take 1,000 mcg by mouth daily.     No facility-administered medications prior to visit.    Allergies  Allergen Reactions   Donepezil Other (See Comments)    nightmares   Ibuprofen Other (See Comments)    Pt unable to recall reaction Patient is not sure what happens if she takes this medicine.   Memantine Other (See Comments)    Nightmares    Simvastatin Other (See Comments)    REACTION: myalgias    ROS Review of Systems    Objective:    Physical Exam Constitutional:      Appearance: Normal appearance. She is well-developed.  HENT:     Head: Normocephalic and atraumatic.  Cardiovascular:     Rate and Rhythm: Normal rate and regular rhythm.     Heart sounds: Normal heart sounds.  Pulmonary:     Effort: Pulmonary effort is normal.     Breath sounds: Normal breath sounds.  Skin:    General: Skin is warm and dry.  Neurological:     Mental Status: She is alert and oriented to person, place, and time.  Psychiatric:        Behavior: Behavior normal.    BP (!) 152/59   Pulse 60   Temp 98.5 F (36.9 C)   Ht 5' (1.524 m)   Wt 115 lb (52.2 kg)   SpO2 97%   BMI 22.46 kg/m  Wt Readings from Last 3 Encounters:  05/05/21 115 lb (52.2 kg)  12/03/20 112 lb (50.8 kg)  11/06/20 114 lb 1.3 oz (51.7 kg)     There are no preventive care reminders to display for this patient.   There are no preventive care reminders to display for this patient.  Lab Results  Component Value Date   TSH 6.69 (H) 11/06/2020   Lab Results  Component Value Date   WBC 8.9 11/06/2020   HGB 14.0 11/06/2020   HCT 43.3 11/06/2020   MCV 92.9 11/06/2020    PLT 265 11/06/2020   Lab Results  Component Value Date   NA  139 11/06/2020   K 4.4 11/06/2020   CO2 29 11/06/2020   GLUCOSE 97 11/06/2020   BUN 17 11/06/2020   CREATININE 0.83 11/06/2020   BILITOT 1.0 10/02/2019   ALKPHOS 98 11/05/2016   AST 17 10/02/2019   ALT 14 10/02/2019   PROT 6.4 10/02/2019   ALBUMIN 4.0 11/05/2016   CALCIUM 9.8 11/06/2020   Lab Results  Component Value Date   CHOL 114 10/02/2019   Lab Results  Component Value Date   HDL 51 10/02/2019   Lab Results  Component Value Date   LDLCALC 47 10/02/2019   Lab Results  Component Value Date   TRIG 76 10/02/2019   Lab Results  Component Value Date   CHOLHDL 2.2 10/02/2019   Lab Results  Component Value Date   HGBA1C 5.2 10/02/2019      Assessment & Plan:   Problem List Items Addressed This Visit       Cardiovascular and Mediastinum   Purpura senilis (HCC)   Relevant Medications   aspirin EC 81 MG tablet   HYPERTENSION, BENIGN - Primary    BP a little elevated tdoay. Last BP looked great! Will monitor.        Relevant Medications   aspirin EC 81 MG tablet   Other Relevant Orders   COMPLETE METABOLIC PANEL WITH GFR   CBC with Differential/Platelet     Endocrine   Hypothyroidism    Due for repeat TSH since last TSH was off.        Relevant Orders   TSH     Other   S/P right hip fracture    She does feel like her leg is shorter but she goes barefoot all the time.  We discussed that we could get her to ortho/sports med for a heel lift. She will consider it.        Hyperlipidemia    Due to recheck lipids.       Relevant Medications   aspirin EC 81 MG tablet   Other Relevant Orders   Lipid panel   Generalized anxiety disorder    Did refill your xanax.  Make sure to use sparingly.       Relevant Medications   ALPRAZolam (XANAX) 1 MG tablet   Other Visit Diagnoses     Need for influenza vaccination       Relevant Orders   Flu Vaccine QUAD High Dose(Fluad) (Completed)        Meds ordered this encounter  Medications   ALPRAZolam (XANAX) 1 MG tablet    Sig: TAKE 1 TABLET AT BEDTIME AS NEEDED FOR ANXIETY.    Dispense:  30 tablet    Refill:  1    Follow-up: Return in about 6 months (around 11/03/2021) for thyroid. Nani Gasser, MD

## 2021-05-05 NOTE — Assessment & Plan Note (Signed)
Due for repeat TSH since last TSH was off.

## 2021-05-05 NOTE — Assessment & Plan Note (Signed)
Due to recheck lipids. 

## 2021-05-05 NOTE — Assessment & Plan Note (Signed)
BP a little elevated tdoay. Last BP looked great! Will monitor.

## 2021-05-05 NOTE — Assessment & Plan Note (Signed)
Did refill your xanax.  Make sure to use sparingly.

## 2021-05-06 ENCOUNTER — Other Ambulatory Visit: Payer: Self-pay | Admitting: Family Medicine

## 2021-05-06 DIAGNOSIS — E039 Hypothyroidism, unspecified: Secondary | ICD-10-CM

## 2021-05-06 LAB — CBC WITH DIFFERENTIAL/PLATELET
Absolute Monocytes: 619 cells/uL (ref 200–950)
Basophils Absolute: 52 cells/uL (ref 0–200)
Basophils Relative: 0.6 %
Eosinophils Absolute: 181 cells/uL (ref 15–500)
Eosinophils Relative: 2.1 %
HCT: 40.6 % (ref 35.0–45.0)
Hemoglobin: 13 g/dL (ref 11.7–15.5)
Lymphs Abs: 2735 cells/uL (ref 850–3900)
MCH: 30.4 pg (ref 27.0–33.0)
MCHC: 32 g/dL (ref 32.0–36.0)
MCV: 95.1 fL (ref 80.0–100.0)
MPV: 9.9 fL (ref 7.5–12.5)
Monocytes Relative: 7.2 %
Neutro Abs: 5014 cells/uL (ref 1500–7800)
Neutrophils Relative %: 58.3 %
Platelets: 255 10*3/uL (ref 140–400)
RBC: 4.27 10*6/uL (ref 3.80–5.10)
RDW: 12 % (ref 11.0–15.0)
Total Lymphocyte: 31.8 %
WBC: 8.6 10*3/uL (ref 3.8–10.8)

## 2021-05-06 LAB — LIPID PANEL
Cholesterol: 107 mg/dL (ref <200)
HDL: 40 mg/dL — ABNORMAL LOW (ref >=50)
LDL Cholesterol (Calc): 44 mg/dL (calc)
Non-HDL Cholesterol (Calc): 67 mg/dL (calc) (ref <130)
Total CHOL/HDL Ratio: 2.7 (calc) (ref <5.0)
Triglycerides: 148 mg/dL (ref <150)

## 2021-05-06 LAB — COMPLETE METABOLIC PANEL WITH GFR
AG Ratio: 1.7 (calc) (ref 1.0–2.5)
ALT: 15 U/L (ref 6–29)
AST: 17 U/L (ref 10–35)
Albumin: 4 g/dL (ref 3.6–5.1)
Alkaline phosphatase (APISO): 133 U/L (ref 37–153)
BUN: 19 mg/dL (ref 7–25)
CO2: 29 mmol/L (ref 20–32)
Calcium: 9.6 mg/dL (ref 8.6–10.4)
Chloride: 104 mmol/L (ref 98–110)
Creat: 0.78 mg/dL (ref 0.60–0.95)
Globulin: 2.3 g/dL (calc) (ref 1.9–3.7)
Glucose, Bld: 101 mg/dL (ref 65–139)
Potassium: 4.6 mmol/L (ref 3.5–5.3)
Sodium: 140 mmol/L (ref 135–146)
Total Bilirubin: 0.7 mg/dL (ref 0.2–1.2)
Total Protein: 6.3 g/dL (ref 6.1–8.1)
eGFR: 72 mL/min/{1.73_m2} (ref >=60)

## 2021-05-06 LAB — TSH: TSH: 6.97 mIU/L — ABNORMAL HIGH (ref 0.40–4.50)

## 2021-05-07 ENCOUNTER — Other Ambulatory Visit: Payer: Self-pay | Admitting: *Deleted

## 2021-05-07 ENCOUNTER — Other Ambulatory Visit: Payer: Self-pay | Admitting: Family Medicine

## 2021-05-07 DIAGNOSIS — E039 Hypothyroidism, unspecified: Secondary | ICD-10-CM

## 2021-05-07 MED ORDER — LEVOTHYROXINE SODIUM 112 MCG PO TABS: 112.0000 ug | ORAL_TABLET | Freq: Every day | ORAL | 0 refills | Status: DC

## 2021-05-07 NOTE — Progress Notes (Signed)
Call patient: Metabolic panel looks great.  Cholesterol looks good.  Her good cholesterol has dropped a little bit this is usually from exercise or just make sure that she is staying as active as she can.  Blood count is normal.  Her thyroid is still off.  Please verify her dose.  I will need to make a slight adjustment to increase it and then we can always recheck it again in 6 to 8 weeks.

## 2021-05-07 NOTE — Progress Notes (Signed)
Meds ordered this encounter  Medications   levothyroxine (SYNTHROID) 112 MCG tablet    Sig: Take 1 tablet (112 mcg total) by mouth daily.    Dispense:  30 tablet    Refill:  0    Pls d/c on file   To [facility]

## 2021-05-07 NOTE — Progress Notes (Signed)
   New prescription sent to [facility] for higher dose thyroid medication.  I would like to recheck her level again in 6 weeks.  She is doing well on it and we can send a 90-day prescription to her mail order.

## 2021-05-09 ENCOUNTER — Telehealth: Payer: Self-pay | Admitting: *Deleted

## 2021-05-09 NOTE — Telephone Encounter (Signed)
I am confused.  The medication is exactly the same thing we just change the dose by 12 mcg which is very tiny.  So the side effects are exactly the same as the medication that she previously took so I am a little confused about what exactly her concerns are.  We went from 100 mcg to 112 mcg.  So I really want her to take it every day and then we can recheck her level in about 6 weeks.  Was she not taking the old one?

## 2021-05-09 NOTE — Telephone Encounter (Signed)
Pt called and states that she p/u the Levothyroxine yesterday and read the side effects and she doesn't feel comfortable about taking this because of what she has read.  She wanted to know if Dr. Linford Arnold could send in something "stronger". She liked the ones that she was taking.  She said that she did take one this morning and she has felt "shaky" she said it's like she has done something that she should not have.   She asked that if she does send in another prescription that it be sent to her mail order [facility].  I asked her if she has any of her old prescription left she said that she does. She checked the bottle and said it was last filled 03/05/21 and she has a right many left.   Will fwd to pcp for advice.

## 2021-05-14 DIAGNOSIS — H353221 Exudative age-related macular degeneration, left eye, with active choroidal neovascularization: Secondary | ICD-10-CM | POA: Diagnosis not present

## 2021-05-14 NOTE — Telephone Encounter (Signed)
Pt advised to take the 112 mcg daily. She stated that she would start taking this and come in and get labs done.

## 2021-05-22 DIAGNOSIS — G40209 Localization-related (focal) (partial) symptomatic epilepsy and epileptic syndromes with complex partial seizures, not intractable, without status epilepticus: Secondary | ICD-10-CM | POA: Diagnosis not present

## 2021-05-22 DIAGNOSIS — M545 Low back pain, unspecified: Secondary | ICD-10-CM | POA: Diagnosis not present

## 2021-05-22 DIAGNOSIS — M5412 Radiculopathy, cervical region: Secondary | ICD-10-CM | POA: Diagnosis not present

## 2021-05-22 DIAGNOSIS — M5417 Radiculopathy, lumbosacral region: Secondary | ICD-10-CM | POA: Diagnosis not present

## 2021-06-04 ENCOUNTER — Other Ambulatory Visit: Payer: Self-pay | Admitting: Family Medicine

## 2021-06-04 DIAGNOSIS — E039 Hypothyroidism, unspecified: Secondary | ICD-10-CM

## 2021-06-12 ENCOUNTER — Other Ambulatory Visit: Payer: Self-pay | Admitting: *Deleted

## 2021-06-12 DIAGNOSIS — E039 Hypothyroidism, unspecified: Secondary | ICD-10-CM

## 2021-06-12 DIAGNOSIS — H353221 Exudative age-related macular degeneration, left eye, with active choroidal neovascularization: Secondary | ICD-10-CM | POA: Diagnosis not present

## 2021-06-12 MED ORDER — LEVOTHYROXINE SODIUM 112 MCG PO TABS
112.0000 ug | ORAL_TABLET | Freq: Every day | ORAL | 0 refills | Status: DC
Start: 2021-06-12 — End: 2021-06-18

## 2021-06-17 DIAGNOSIS — E039 Hypothyroidism, unspecified: Secondary | ICD-10-CM | POA: Diagnosis not present

## 2021-06-18 ENCOUNTER — Other Ambulatory Visit: Payer: Self-pay | Admitting: *Deleted

## 2021-06-18 ENCOUNTER — Other Ambulatory Visit: Payer: Self-pay | Admitting: Family Medicine

## 2021-06-18 DIAGNOSIS — E039 Hypothyroidism, unspecified: Secondary | ICD-10-CM

## 2021-06-18 LAB — TSH: TSH: 8.41 mIU/L — ABNORMAL HIGH (ref 0.40–4.50)

## 2021-06-18 MED ORDER — LEVOTHYROXINE SODIUM 112 MCG PO TABS: 112.0000 ug | ORAL_TABLET | Freq: Every day | ORAL | 0 refills | Status: DC

## 2021-06-18 NOTE — Progress Notes (Signed)
Okay, I will send a 90-day prescription to her mail order.  Just encouraged her to make sure that she is taking the 100 mcg daily as soon as she gets it back and then I wanted recheck her levels again in 6 to 8 weeks.

## 2021-06-18 NOTE — Progress Notes (Signed)
Please call patient: Did she pick up the new medication and start taking it tomorrow.  Her TSH really did not get any better.  She should be taking 112 mcg every day.  We went up from the 100 mcg dose.  It should have made a difference.  Also make sure she is not skipping any medication and she is taking it an hour before breakfast and away from other medicines or supplements.

## 2021-07-16 DIAGNOSIS — E039 Hypothyroidism, unspecified: Secondary | ICD-10-CM | POA: Diagnosis not present

## 2021-07-17 LAB — TSH: TSH: 5.64 mIU/L — ABNORMAL HIGH (ref 0.40–4.50)

## 2021-07-17 NOTE — Progress Notes (Signed)
Call patient: Her thyroid looks a little bit better but it is off just slightly so if she could just verify her dosing and we can make a small adjustment and then recheck again in 2 months.

## 2021-07-17 NOTE — Addendum Note (Signed)
Addended by: Deno Etienne on: 07/17/2021 09:05 AM   Modules accepted: Orders

## 2021-07-18 NOTE — Progress Notes (Signed)
Okay, have her take 1-1/2 tabs on Saturdays, and a whole tab the other 6 days a week.  And then lets plan to recheck her again in 8 weeks.

## 2021-07-23 DIAGNOSIS — H353221 Exudative age-related macular degeneration, left eye, with active choroidal neovascularization: Secondary | ICD-10-CM | POA: Diagnosis not present

## 2021-07-23 DIAGNOSIS — H35033 Hypertensive retinopathy, bilateral: Secondary | ICD-10-CM | POA: Diagnosis not present

## 2021-07-23 DIAGNOSIS — H353112 Nonexudative age-related macular degeneration, right eye, intermediate dry stage: Secondary | ICD-10-CM | POA: Diagnosis not present

## 2021-07-23 DIAGNOSIS — H179 Unspecified corneal scar and opacity: Secondary | ICD-10-CM | POA: Diagnosis not present

## 2021-07-29 DIAGNOSIS — I4891 Unspecified atrial fibrillation: Secondary | ICD-10-CM | POA: Diagnosis not present

## 2021-08-20 ENCOUNTER — Other Ambulatory Visit: Payer: Self-pay

## 2021-08-20 ENCOUNTER — Ambulatory Visit (INDEPENDENT_AMBULATORY_CARE_PROVIDER_SITE_OTHER): Payer: Medicare HMO | Admitting: Physician Assistant

## 2021-08-20 ENCOUNTER — Encounter: Payer: Self-pay | Admitting: Physician Assistant

## 2021-08-20 VITALS — BP 168/59 | HR 73 | Ht 60.0 in | Wt 112.4 lb

## 2021-08-20 DIAGNOSIS — I10 Essential (primary) hypertension: Secondary | ICD-10-CM

## 2021-08-20 DIAGNOSIS — I480 Paroxysmal atrial fibrillation: Secondary | ICD-10-CM | POA: Diagnosis not present

## 2021-08-20 DIAGNOSIS — I495 Sick sinus syndrome: Secondary | ICD-10-CM | POA: Diagnosis not present

## 2021-08-20 DIAGNOSIS — F411 Generalized anxiety disorder: Secondary | ICD-10-CM | POA: Diagnosis not present

## 2021-08-20 MED ORDER — ALPRAZOLAM 1 MG PO TABS: ORAL_TABLET | ORAL | 1 refills | Status: DC

## 2021-08-20 MED ORDER — LISINOPRIL 5 MG PO TABS: 5.0000 mg | ORAL_TABLET | Freq: Every day | ORAL | 0 refills | Status: DC

## 2021-08-20 NOTE — Progress Notes (Signed)
Subjective:    Patient ID: Tina Graham, female    DOB: June 19, 1929, 88 y.o.   MRN: [id number]  HPI Pt is a 88 yo female with SSS, A.fib, HTN who presents to the clinic to follow up on blood pressure. Pt has been checking multiple times a day and in the afternoon and evenings her BP rises most days to 160s over 60-70s. No CP, palpitations, headaches or vision changes. She does not have any SOB or edema as well. She gets very anxious and upset when her BP cannot be controlled. She is taking carvedilol and amlodipine daily. She does request her xanax to be refilled.   .. Active Ambulatory Problems    Diagnosis Date Noted   Hypothyroidism 04/30/2008   Hyperlipidemia 03/27/2008   HYPERTENSION, BENIGN 03/27/2008   Osteoporosis, unspecified 02/06/2011   Nonspecific abnormal electrocardiogram (ECG) (EKG) 02/25/2011   GERD (gastroesophageal reflux disease) 05/27/2012   History of TIA (transient ischemic attack) 02/06/2014   Generalized anxiety disorder 08/09/2014   CKD (chronic kidney disease) stage 3, GFR 30-59 ml/min (HCC) 08/07/2015   Syncope 02/26/2016   Impaired memory 06/15/2016   PAF (paroxysmal atrial fibrillation) ([place]) 10/02/2016   SSS (sick sinus syndrome) ([place]) 08/20/2016   Presence of cardiac defibrillator 02/04/2017   Actinic keratoses 12/07/2019   Cervical radiculopathy 01/02/2020   S/P right hip fracture 09/06/2020   Anemia, blood loss 09/06/2020   Purpura senilis ([place]) 05/05/2021   Resolved Ambulatory Problems    Diagnosis Date Noted   Other acute reactions to stress 02/05/2009   Past Medical History:  Diagnosis Date   H. pylori infection    HOH (hard of hearing)    Hypertension      Review of Systems See HPI.     Objective:   Physical Exam Vitals reviewed.  Constitutional:      Appearance: Normal appearance.  HENT:     Head: Normocephalic.  Neck:     Vascular: No carotid bruit.  Cardiovascular:     Rate and Rhythm: Normal rate and regular rhythm.      Pulses: Normal pulses.  Pulmonary:     Effort: Pulmonary effort is normal.     Breath sounds: Normal breath sounds.  Musculoskeletal:     Right lower leg: No edema.     Left lower leg: No edema.  Lymphadenopathy:     Cervical: No cervical adenopathy.  Neurological:     General: No focal deficit present.     Mental Status: She is alert and oriented to person, place, and time.  Psychiatric:        Mood and Affect: Mood normal.          Assessment & Plan:   Marland KitchenMarland KitchenCallista was seen today for hypertension.  Diagnoses and all orders for this visit:  Essential hypertension, benign -     lisinopril (ZESTRIL) 5 MG tablet; Take 1 tablet (5 mg total) by mouth daily.  Generalized anxiety disorder -     ALPRAZolam (XANAX) 1 MG tablet; TAKE 1 TABLET AT BEDTIME AS NEEDED FOR ANXIETY.  PAF (paroxysmal atrial fibrillation) (HCC)  SSS (sick sinus syndrome) (HCC)   Continue same BP medications No signs of fluid overload Pt is in rhythm today Systolic BP elevated diastolic low Added lisinopril 5mg  for systolic decrease to take in the morning Continue to monitor BP and bring in log in 2 weeks with follow up with PCP Refilled xanax as patient has been on this. PCP to continue refills.   Spent  30 minutes with patient reviewing chart, discussing medications.

## 2021-08-20 NOTE — Patient Instructions (Signed)
Add lisinopril 5mg  in the morning to your medications.

## 2021-09-03 DIAGNOSIS — H353221 Exudative age-related macular degeneration, left eye, with active choroidal neovascularization: Secondary | ICD-10-CM | POA: Diagnosis not present

## 2021-09-04 ENCOUNTER — Other Ambulatory Visit: Payer: Self-pay

## 2021-09-04 ENCOUNTER — Encounter: Payer: Self-pay | Admitting: Family Medicine

## 2021-09-04 ENCOUNTER — Ambulatory Visit (INDEPENDENT_AMBULATORY_CARE_PROVIDER_SITE_OTHER): Payer: Medicare HMO | Admitting: Family Medicine

## 2021-09-04 VITALS — BP 142/65 | HR 60 | Resp 16 | Ht 60.0 in | Wt 112.0 lb

## 2021-09-04 DIAGNOSIS — E785 Hyperlipidemia, unspecified: Secondary | ICD-10-CM | POA: Diagnosis not present

## 2021-09-04 DIAGNOSIS — E039 Hypothyroidism, unspecified: Secondary | ICD-10-CM

## 2021-09-04 DIAGNOSIS — I10 Essential (primary) hypertension: Secondary | ICD-10-CM

## 2021-09-04 DIAGNOSIS — E538 Deficiency of other specified B group vitamins: Secondary | ICD-10-CM | POA: Diagnosis not present

## 2021-09-04 DIAGNOSIS — F411 Generalized anxiety disorder: Secondary | ICD-10-CM | POA: Diagnosis not present

## 2021-09-04 MED ORDER — ALPRAZOLAM 0.5 MG PO TABS: 0.5000 mg | ORAL_TABLET | Freq: Every day | ORAL | 0 refills | Status: DC | PRN

## 2021-09-04 MED ORDER — ATORVASTATIN CALCIUM 40 MG PO TABS: 40.0000 mg | ORAL_TABLET | Freq: Every day | ORAL | 3 refills | Status: DC

## 2021-09-04 MED ORDER — AMLODIPINE-OLMESARTAN 5-20 MG PO TABS: 1.0000 | ORAL_TABLET | Freq: Every day | ORAL | 1 refills | Status: DC

## 2021-09-04 NOTE — Progress Notes (Addendum)
Established Patient Office Visit  Subjective:  Patient ID: Tina Graham, female    DOB: 1929-04-05  Age: 88 y.o. MRN: [id number]  CC:  Chief Complaint  Patient presents with   Hypertension    Follow up    TSH    Follow up     HPI Tina Graham presents for elevated blood pressure-she recently saw one of my partners for elevated blood pressure levels.  They it did add a low-dose ACE inhibitor asked her to follow-up in 2 weeks with me.  When her blood pressure would go up she would feel more anxious.  She did request a refill on her alprazolam which she uses occasional  Hypothyroidism-we recently checked her TSH in December and it was slightly elevated so we had recommended taking 1-1/2 tabs on Saturdays, and a whole tab the other 6 days a week.    She has been getting injections in her left eye the last 1 was yesterday.  She said that it is usually sore for a few days afterwards.  Feels like overall she is actually doing really well she has not had any recent syncopal events and no recent seizures.  Past Medical History:  Diagnosis Date   H. pylori infection    treated   HOH (hard of hearing)    Hyperlipidemia    Hypertension    Hypothyroidism     Past Surgical History:  Procedure Laterality Date   APPENDECTOMY     CHOLECYSTECTOMY     THYROIDECTOMY     partial for goiter    Family History  Problem Relation Age of Onset   Cancer Daughter    Heart disease Sister        unknown specifics    Social History   Socioeconomic History   Marital status: Widowed    Spouse name: Not on file   Number of children: 5   Years of education: 6   Highest education level: GED or equivalent  Occupational History    Employer: RETIRED  Tobacco Use   Smoking status: Former   Smokeless tobacco: Never  Scientific laboratory technician Use: Never used  Substance and Sexual Activity   Alcohol use: No   Drug use: No   Sexual activity: Not Currently    Comment: housewife, widowed, lives  alone, regular exercise  Other Topics Concern   Not on file  Social History Narrative   Has back problems and sleeps a lot due to this- back pain. Watches tv in her free time.   Social Determinants of Health   Financial Resource Strain: Low Risk    Difficulty of Paying Living Expenses: Not hard at all  Food Insecurity: No Food Insecurity   Worried About Charity fundraiser in the Last Year: Never true   [place] in the Last Year: Never true  Transportation Needs: No Transportation Needs   Lack of Transportation (Medical): No   Lack of Transportation (Non-Medical): No  Physical Activity: Inactive   Days of Exercise per Week: 0 days   Minutes of Exercise per Session: 0 min  Stress: No Stress Concern Present   Feeling of Stress : Not at all  Social Connections: Socially Isolated   Frequency of Communication with Friends and Family: More than three times a week   Frequency of Social Gatherings with Friends and Family: Twice a week   Attends Religious Services: Never   Marine scientist or Organizations: No   Attends  Club or Organization Meetings: Never   Marital Status: Widowed  Human resources officer Violence: Not At Risk   Fear of Current or Ex-Partner: No   Emotionally Abused: No   Physically Abused: No   Sexually Abused: No    Outpatient Medications Prior to Visit  Medication Sig Dispense Refill   aspirin EC 81 MG tablet Take 81 mg by mouth daily. Swallow whole.     CALCIUM-VITAMIN D PO Take 1 tablet by mouth 2 (two) times daily.     carvedilol (COREG) 25 MG tablet      digoxin (LANOXIN) 0.125 MG tablet Take by mouth.     lamoTRIgine (LAMICTAL) 25 MG tablet Take 50 mg by mouth 2 (two) times daily.      levothyroxine (SYNTHROID) 112 MCG tablet Take 1 tablet (112 mcg total) by mouth daily. 90 tablet 0   Multiple Vitamins-Minerals (PRESERVISION AREDS 2 PO) Take by mouth.     Omega-3 Fatty Acids (FISH OIL) 1000 MG CAPS Take 1,000 mg by mouth daily.     pantoprazole  (PROTONIX) 40 MG tablet Take 1 tablet (40 mg total) by mouth daily. 90 tablet 3   Selenium (SELENIMIN PO) Take by mouth.     UNABLE TO FIND Med Name: Vitamin O     UNABLE TO FIND Med Name: All Day/Energy Greens     vitamin B-12 (CYANOCOBALAMIN) 1000 MCG tablet Take 1,000 mcg by mouth daily.     ALPRAZolam (XANAX) 1 MG tablet TAKE 1 TABLET AT BEDTIME AS NEEDED FOR ANXIETY. 30 tablet 1   amLODipine (NORVASC) 5 MG tablet TAKE 1 TABLET EVERY DAY 90 tablet 1   atorvastatin (LIPITOR) 40 MG tablet TAKE 1 TABLET EVERY DAY 90 tablet 3   lisinopril (ZESTRIL) 5 MG tablet Take 1 tablet (5 mg total) by mouth daily. 30 tablet 0   SELENIUM SULFIDE EX Apply topically.     No facility-administered medications prior to visit.    Allergies  Allergen Reactions   Donepezil Other (See Comments)    nightmares   Ibuprofen Other (See Comments)    Pt unable to recall reaction Patient is not sure what happens if she takes this medicine.   Memantine Other (See Comments)    Nightmares    Simvastatin Other (See Comments)    REACTION: myalgias    ROS Review of Systems    Objective:    Physical Exam Constitutional:      Appearance: Normal appearance. She is well-developed.  HENT:     Head: Normocephalic and atraumatic.  Cardiovascular:     Rate and Rhythm: Normal rate and regular rhythm.     Heart sounds: Normal heart sounds.  Pulmonary:     Effort: Pulmonary effort is normal.     Breath sounds: Normal breath sounds.  Skin:    General: Skin is warm and dry.  Neurological:     Mental Status: She is alert and oriented to person, place, and time.  Psychiatric:        Behavior: Behavior normal.    BP (!) 142/65 (BP Location: Left Arm)    Pulse 60    Resp 16    Ht 5' (1.524 m)    Wt 112 lb (50.8 kg)    BMI 21.87 kg/m  Wt Readings from Last 3 Encounters:  09/04/21 112 lb (50.8 kg)  08/20/21 112 lb 6.4 oz (51 kg)  05/05/21 115 lb (52.2 kg)     Health Maintenance Due  Topic Date Due   Zoster  Vaccines- Shingrix (1 of 2) Never done   COVID-19 Vaccine (2 - Janssen risk series) 08/28/2020    There are no preventive care reminders to display for this patient.  Lab Results  Component Value Date   TSH 5.64 (H) 07/16/2021   Lab Results  Component Value Date   WBC 8.6 05/05/2021   HGB 13.0 05/05/2021   HCT 40.6 05/05/2021   MCV 95.1 05/05/2021   PLT 255 05/05/2021   Lab Results  Component Value Date   NA 140 05/05/2021   K 4.6 05/05/2021   CO2 29 05/05/2021   GLUCOSE 101 05/05/2021   BUN 19 05/05/2021   CREATININE 0.78 05/05/2021   BILITOT 0.7 05/05/2021   ALKPHOS 98 11/05/2016   AST 17 05/05/2021   ALT 15 05/05/2021   PROT 6.3 05/05/2021   ALBUMIN 4.0 11/05/2016   CALCIUM 9.6 05/05/2021   EGFR 72 05/05/2021   Lab Results  Component Value Date   CHOL 107 05/05/2021   Lab Results  Component Value Date   HDL 40 (L) 05/05/2021   Lab Results  Component Value Date   LDLCALC 44 05/05/2021   Lab Results  Component Value Date   TRIG 148 05/05/2021   Lab Results  Component Value Date   CHOLHDL 2.7 05/05/2021   Lab Results  Component Value Date   HGBA1C 5.2 10/02/2019      Assessment & Plan:   Problem List Items Addressed This Visit       Cardiovascular and Mediastinum   HYPERTENSION, BENIGN - Primary    Repeat blood pressure does look better.  I am going to see if we can maybe combine her amlodipine and switch to an ARB so we can combine them and make her pill regimen a little bit more simple.  Her cardiologist writes her carvedilol and her digoxin.  If she has any problems she can let me know.  We will get an updated BMP today since she has been on the ACE inhibitor for 2 weeks at this point.      Relevant Medications   atorvastatin (LIPITOR) 40 MG tablet   amLODipine-olmesartan (AZOR) 5-20 MG tablet   Other Relevant Orders   BASIC METABOLIC PANEL WITH GFR     Endocrine   Hypothyroidism    Recheck thyroid level after recent adjustment she has  been on her current regimen for about 8 weeks.      Relevant Orders   TSH   BASIC METABOLIC PANEL WITH GFR     Other   Hyperlipidemia    Continue atorvastatin.      Relevant Medications   atorvastatin (LIPITOR) 40 MG tablet   amLODipine-olmesartan (AZOR) 5-20 MG tablet   Generalized anxiety disorder    Going to go ahead and decrease the alprazolam down to 0.5 mg when she refills it later this month.  She says she usually splits and quarters it.  But she is refilling it a little bit more frequently than I would expect.  So we are going to start adjusting the dose.      Relevant Medications   ALPRAZolam (XANAX) 0.5 MG tablet (Start on 09/19/2021)   Other Visit Diagnoses     Low serum vitamin B12       Relevant Orders   B12       Plan to recheck B12 levels if they are normal then I will have her stop her B12.  Meds ordered this encounter  Medications   ALPRAZolam (XANAX) 0.5 MG  tablet    Sig: Take 1 tablet (0.5 mg total) by mouth daily as needed for anxiety.    Dispense:  30 tablet    Refill:  0    Please cancel all refills left on the 60m dose.   atorvastatin (LIPITOR) 40 MG tablet    Sig: Take 1 tablet (40 mg total) by mouth daily. At bedtime    Dispense:  90 tablet    Refill:  3   amLODipine-olmesartan (AZOR) 5-20 MG tablet    Sig: Take 1 tablet by mouth daily.    Dispense:  30 tablet    Refill:  1    Follow-up: Return in about 3 weeks (around 09/25/2021) for Hypertension, New start medication.    CBeatrice Lecher MD

## 2021-09-04 NOTE — Assessment & Plan Note (Signed)
Recheck thyroid level after recent adjustment she has been on her current regimen for about 8 weeks.

## 2021-09-04 NOTE — Patient Instructions (Signed)
Once you get your new prescription for amlodipine/omelsartan.  You will stop your plain amlodipine and stop your lisinopril.

## 2021-09-04 NOTE — Assessment & Plan Note (Signed)
Going to go ahead and decrease the alprazolam down to 0.5 mg when she refills it later this month.  She says she usually splits and quarters it.  But she is refilling it a little bit more frequently than I would expect.  So we are going to start adjusting the dose.

## 2021-09-04 NOTE — Assessment & Plan Note (Signed)
Continue atorvastatin

## 2021-09-04 NOTE — Assessment & Plan Note (Signed)
Repeat blood pressure does look better.  I am going to see if we can maybe combine her amlodipine and switch to an ARB so we can combine them and make her pill regimen a little bit more simple.  Her cardiologist writes her carvedilol and her digoxin.  If she has any problems she can let me know.  We will get an updated BMP today since she has been on the ACE inhibitor for 2 weeks at this point.

## 2021-09-05 ENCOUNTER — Other Ambulatory Visit: Payer: Self-pay

## 2021-09-05 ENCOUNTER — Other Ambulatory Visit: Payer: Self-pay | Admitting: Family Medicine

## 2021-09-05 DIAGNOSIS — E039 Hypothyroidism, unspecified: Secondary | ICD-10-CM

## 2021-09-05 LAB — BASIC METABOLIC PANEL WITH GFR
BUN: 22 mg/dL (ref 7–25)
CO2: 29 mmol/L (ref 20–32)
Calcium: 9.6 mg/dL (ref 8.6–10.4)
Chloride: 103 mmol/L (ref 98–110)
Creat: 0.95 mg/dL (ref 0.60–0.95)
Glucose, Bld: 92 mg/dL (ref 65–99)
Potassium: 4.9 mmol/L (ref 3.5–5.3)
Sodium: 140 mmol/L (ref 135–146)
eGFR: 57 mL/min/{1.73_m2} — ABNORMAL LOW (ref >=60)

## 2021-09-05 LAB — TSH: TSH: 1.32 mIU/L (ref 0.40–4.50)

## 2021-09-05 LAB — VITAMIN B12: Vitamin B-12: 1435 pg/mL — ABNORMAL HIGH (ref 200–1100)

## 2021-09-05 MED ORDER — LEVOTHYROXINE SODIUM 112 MCG PO TABS: 112.0000 ug | ORAL_TABLET | Freq: Every day | ORAL | 1 refills | Status: DC

## 2021-09-05 NOTE — Progress Notes (Signed)
Call pt: thyroid looks great.  Please stop b12 pill. Level is too high.

## 2021-09-16 ENCOUNTER — Telehealth: Payer: Self-pay

## 2021-09-16 NOTE — Telephone Encounter (Signed)
Patient stated she has been feeling jittery and having low BP (93/61) since starting on the Azor 5-20 mg. Forward to Dr. Madilyn Fireman.

## 2021-09-16 NOTE — Telephone Encounter (Signed)
Reviewed message with Dr. Madilyn Fireman. Per Dr. Madilyn Fireman, please have patient cut her Amlodipine-Olmesartan in half to take 1/2 tablet daily. Patient advised and verbalized understanding.

## 2021-09-25 ENCOUNTER — Other Ambulatory Visit: Payer: Self-pay

## 2021-09-25 ENCOUNTER — Encounter: Payer: Self-pay | Admitting: Family Medicine

## 2021-09-25 ENCOUNTER — Ambulatory Visit (INDEPENDENT_AMBULATORY_CARE_PROVIDER_SITE_OTHER): Payer: Medicare HMO | Admitting: Family Medicine

## 2021-09-25 VITALS — BP 176/62 | HR 59 | Ht 60.0 in | Wt 111.0 lb

## 2021-09-25 DIAGNOSIS — I10 Essential (primary) hypertension: Secondary | ICD-10-CM

## 2021-09-25 DIAGNOSIS — E039 Hypothyroidism, unspecified: Secondary | ICD-10-CM | POA: Diagnosis not present

## 2021-09-25 MED ORDER — LEVOTHYROXINE SODIUM 112 MCG PO TABS: 112.0000 ug | ORAL_TABLET | Freq: Every day | ORAL | 1 refills | Status: DC

## 2021-09-25 NOTE — Assessment & Plan Note (Signed)
He is taking an extra half a tab on Saturdays and that seems to be working well.  Last TSH looked fantastic some can update her prescription to reflect how she is actually taking it and encouraged her to continue with that regimen.  Try to keep her on a lower dose of the amlodipine because she did have swelling on 10 mg.She is still taking an extra half of a tab

## 2021-09-25 NOTE — Assessment & Plan Note (Signed)
We sent out all her bottles today just to make sure that they were accurate especially because we had made some changes not that long ago.  It looks like everything is correct.  She did need refills on some.  And we went through her supplements she is on a little bit of a high dose of vitamin D but we will just continue it for now.

## 2021-09-25 NOTE — Progress Notes (Signed)
Established Patient Office Visit  Subjective:  Patient ID: Tina Graham, female    DOB: 09-12-1928  Age: 88 y.o. MRN: [id number]  CC:  Chief Complaint  Patient presents with   Follow-up    HPI AALIA GREULICH presents for   Hypertension- Pt denies chest pain, SOB, dizziness, or heart palpitations.  Taking meds as directed w/o problems.  Denies medication side effects.  Switch to Azor at last office visit about 3 weeks ago.  Brought in home blood pressure logs today.  Did bring in her logs from the last several months.  She feels like she is tolerating the new medication without any significant side effects or problems.  Brought in some supplements that she is taking and wanted to know if they were safe to take.  He is taking 3 medications that have vitamin D.  Wants her calcium, 1 is her Osteo Bi-Flex, and 1 is her multivitamin.   Past Medical History:  Diagnosis Date   H. pylori infection    treated   HOH (hard of hearing)    Hyperlipidemia    Hypertension    Hypothyroidism     Past Surgical History:  Procedure Laterality Date   APPENDECTOMY     CHOLECYSTECTOMY     THYROIDECTOMY     partial for goiter    Family History  Problem Relation Age of Onset   Cancer Daughter    Heart disease Sister        unknown specifics    Social History   Socioeconomic History   Marital status: Widowed    Spouse name: Not on file   Number of children: 5   Years of education: 6   Highest education level: GED or equivalent  Occupational History    Employer: RETIRED  Tobacco Use   Smoking status: Former   Smokeless tobacco: Never  Scientific laboratory technician Use: Never used  Substance and Sexual Activity   Alcohol use: No   Drug use: No   Sexual activity: Not Currently    Comment: housewife, widowed, lives alone, regular exercise  Other Topics Concern   Not on file  Social History Narrative   Has back problems and sleeps a lot due to this- back pain. Watches tv in her free  time.   Social Determinants of Health   Financial Resource Strain: Low Risk    Difficulty of Paying Living Expenses: Not hard at all  Food Insecurity: No Food Insecurity   Worried About Charity fundraiser in the Last Year: Never true   [place] in the Last Year: Never true  Transportation Needs: No Transportation Needs   Lack of Transportation (Medical): No   Lack of Transportation (Non-Medical): No  Physical Activity: Inactive   Days of Exercise per Week: 0 days   Minutes of Exercise per Session: 0 min  Stress: No Stress Concern Present   Feeling of Stress : Not at all  Social Connections: Socially Isolated   Frequency of Communication with Friends and Family: More than three times a week   Frequency of Social Gatherings with Friends and Family: Twice a week   Attends Religious Services: Never   Marine scientist or Organizations: No   Attends Archivist Meetings: Never   Marital Status: Widowed  Human resources officer Violence: Not At Risk   Fear of Current or Ex-Partner: No   Emotionally Abused: No   Physically Abused: No   Sexually Abused: No  Outpatient Medications Prior to Visit  Medication Sig Dispense Refill   ALPRAZolam (XANAX) 0.5 MG tablet Take 1 tablet (0.5 mg total) by mouth daily as needed for anxiety. 30 tablet 0   amLODipine-olmesartan (AZOR) 5-20 MG tablet Take 1 tablet by mouth daily. 30 tablet 1   aspirin EC 81 MG tablet Take 81 mg by mouth daily. Swallow whole.     atorvastatin (LIPITOR) 40 MG tablet Take 1 tablet (40 mg total) by mouth daily. At bedtime 90 tablet 3   CALCIUM-VITAMIN D PO Take 1 tablet by mouth 2 (two) times daily.     carvedilol (COREG) 25 MG tablet      digoxin (LANOXIN) 0.125 MG tablet Take by mouth.     lamoTRIgine (LAMICTAL) 25 MG tablet Take 50 mg by mouth 2 (two) times daily.      Multiple Vitamins-Minerals (PRESERVISION AREDS 2 PO) Take by mouth.     Omega-3 Fatty Acids (FISH OIL) 1000 MG CAPS Take 1,000 mg by  mouth daily.     pantoprazole (PROTONIX) 40 MG tablet Take 1 tablet (40 mg total) by mouth daily. 90 tablet 3   Selenium (SELENIMIN PO) Take by mouth.     UNABLE TO FIND Med Name: Vitamin O     UNABLE TO FIND Med Name: All Day/Energy Greens     levothyroxine (SYNTHROID) 112 MCG tablet Take 1 tablet (112 mcg total) by mouth daily. 90 tablet 1   No facility-administered medications prior to visit.    Allergies  Allergen Reactions   Donepezil Other (See Comments)    nightmares   Ibuprofen Other (See Comments)    Pt unable to recall reaction Patient is not sure what happens if she takes this medicine.   Memantine Other (See Comments)    Nightmares    Simvastatin Other (See Comments)    REACTION: myalgias    ROS Review of Systems    Objective:    Physical Exam Constitutional:      Appearance: Normal appearance. She is well-developed.  HENT:     Head: Normocephalic and atraumatic.  Cardiovascular:     Rate and Rhythm: Normal rate and regular rhythm.     Heart sounds: Normal heart sounds.  Pulmonary:     Effort: Pulmonary effort is normal.     Breath sounds: Normal breath sounds.  Skin:    General: Skin is warm and dry.  Neurological:     Mental Status: She is alert and oriented to person, place, and time.  Psychiatric:        Behavior: Behavior normal.    BP (!) 176/62    Pulse (!) 59    Ht 5' (1.524 m)    Wt 111 lb (50.3 kg)    SpO2 99%    BMI 21.68 kg/m  Wt Readings from Last 3 Encounters:  09/25/21 111 lb (50.3 kg)  09/04/21 112 lb (50.8 kg)  08/20/21 112 lb 6.4 oz (51 kg)     Health Maintenance Due  Topic Date Due   Zoster Vaccines- Shingrix (1 of 2) Never done   COVID-19 Vaccine (2 - Janssen risk series) 08/28/2020    There are no preventive care reminders to display for this patient.  Lab Results  Component Value Date   TSH 1.32 09/04/2021   Lab Results  Component Value Date   WBC 8.6 05/05/2021   HGB 13.0 05/05/2021   HCT 40.6 05/05/2021    MCV 95.1 05/05/2021   PLT 255 05/05/2021   Lab  Results  Component Value Date   NA 140 09/04/2021   K 4.9 09/04/2021   CO2 29 09/04/2021   GLUCOSE 92 09/04/2021   BUN 22 09/04/2021   CREATININE 0.95 09/04/2021   BILITOT 0.7 05/05/2021   ALKPHOS 98 11/05/2016   AST 17 05/05/2021   ALT 15 05/05/2021   PROT 6.3 05/05/2021   ALBUMIN 4.0 11/05/2016   CALCIUM 9.6 09/04/2021   EGFR 57 (L) 09/04/2021   Lab Results  Component Value Date   CHOL 107 05/05/2021   Lab Results  Component Value Date   HDL 40 (L) 05/05/2021   Lab Results  Component Value Date   LDLCALC 44 05/05/2021   Lab Results  Component Value Date   TRIG 148 05/05/2021   Lab Results  Component Value Date   CHOLHDL 2.7 05/05/2021   Lab Results  Component Value Date   HGBA1C 5.2 10/02/2019      Assessment & Plan:   Problem List Items Addressed This Visit       Cardiovascular and Mediastinum   HYPERTENSION, BENIGN - Primary    We sent out all her bottles today just to make sure that they were accurate especially because we had made some changes not that long ago.  It looks like everything is correct.  She did need refills on some.  And we went through her supplements she is on a little bit of a high dose of vitamin D but we will just continue it for now.        Endocrine   Hypothyroidism    He is taking an extra half a tab on Saturdays and that seems to be working well.  Last TSH looked fantastic some can update her prescription to reflect how she is actually taking it and encouraged her to continue with that regimen.  Try to keep her on a lower dose of the amlodipine because she did have swelling on 10 mg.She is still taking an extra half of a tab      Relevant Medications   levothyroxine (SYNTHROID) 112 MCG tablet    Meds ordered this encounter  Medications   levothyroxine (SYNTHROID) 112 MCG tablet    Sig: Take 1 tablet (112 mcg total) by mouth daily. Take 1.5 tabs on Saturdays.    Dispense:   102 tablet    Refill:  1    D/c old rx on file    Follow-up: Return in about 4 months (around 01/23/2022) for Hypertension.    Beatrice Lecher, MD

## 2021-10-20 ENCOUNTER — Other Ambulatory Visit: Payer: Self-pay | Admitting: Family Medicine

## 2021-10-20 DIAGNOSIS — I10 Essential (primary) hypertension: Secondary | ICD-10-CM

## 2021-10-23 DIAGNOSIS — G603 Idiopathic progressive neuropathy: Secondary | ICD-10-CM | POA: Diagnosis not present

## 2021-10-23 DIAGNOSIS — G3184 Mild cognitive impairment, so stated: Secondary | ICD-10-CM | POA: Diagnosis not present

## 2021-10-23 DIAGNOSIS — G40209 Localization-related (focal) (partial) symptomatic epilepsy and epileptic syndromes with complex partial seizures, not intractable, without status epilepticus: Secondary | ICD-10-CM | POA: Diagnosis not present

## 2021-10-23 DIAGNOSIS — M5417 Radiculopathy, lumbosacral region: Secondary | ICD-10-CM | POA: Diagnosis not present

## 2021-10-23 DIAGNOSIS — M5412 Radiculopathy, cervical region: Secondary | ICD-10-CM | POA: Diagnosis not present

## 2021-10-29 DIAGNOSIS — H353221 Exudative age-related macular degeneration, left eye, with active choroidal neovascularization: Secondary | ICD-10-CM | POA: Diagnosis not present

## 2021-11-02 DIAGNOSIS — I4891 Unspecified atrial fibrillation: Secondary | ICD-10-CM | POA: Diagnosis not present

## 2021-11-03 ENCOUNTER — Ambulatory Visit: Payer: Medicare HMO | Admitting: Family Medicine

## 2021-11-21 ENCOUNTER — Ambulatory Visit (INDEPENDENT_AMBULATORY_CARE_PROVIDER_SITE_OTHER): Payer: Medicare HMO | Admitting: Family Medicine

## 2021-11-21 DIAGNOSIS — Z0000 Encounter for general adult medical examination without abnormal findings: Secondary | ICD-10-CM

## 2021-11-21 NOTE — Progress Notes (Signed)
? ? ?MEDICARE ANNUAL WELLNESS VISIT ? ?11/21/2021 ? ?Telephone Visit Disclaimer ?This Medicare AWV was conducted by telephone due to national recommendations for restrictions regarding the COVID-19 Pandemic (e.g. social distancing).  I verified, using two identifiers, that I am speaking with Tina Graham or their authorized healthcare agent. I discussed the limitations, risks, security, and privacy concerns of performing an evaluation and management service by telephone and the potential availability of an in-person appointment in the future. The patient expressed understanding and agreed to proceed.  ?Location of Patient: Home ?Location of Provider (nurse):  In the office. ? ?Subjective:  ? ? ?Tina Graham is a 88 y.o. female patient of Metheney, Rene Kocher, MD who had a Medicare Annual Wellness Visit today via telephone. Tina Graham is Retired and lives alone. she had 5 children, three passed away. she reports that she is socially active and does interact with friends/family regularly. she is minimally physically active and enjoys shopping and watching television. ? ?Patient Care Team: ?Hali Marry, MD as PCP - General (Family Medicine) ?Boyd Kerbs, MD as Referring Physician (Rheumatology) ? ? ?  11/21/2021  ?  9:57 AM 11/06/2020  ? 10:08 AM 03/20/2019  ? 10:09 AM  ?Advanced Directives  ?Does Patient Have a Medical Advance Directive? No Yes Yes  ?Type of Advance Directive  Living will;Healthcare Power of [place];Living will  ?Does patient want to make changes to medical advance directive?  No - Patient declined No - Patient declined  ?Copy of [place] in Chart?  No - copy requested No - copy requested  ?Would patient like information on creating a medical advance directive? No - Patient declined    ? ? ?Hospital Utilization Over the Past 12 Months: ?# of hospitalizations or ER visits: 0 ?# of surgeries: 0 ? ?Review of Systems    ?Patient reports that  her overall health is unchanged compared to last year. ? ?History obtained from chart review and the patient ? ?Patient Reported Readings (BP, Pulse, CBG, Weight, etc) ?none ? ?Pain Assessment ?Pain : No/denies pain ? ?  ? ?Current Medications & Allergies (verified) ?Allergies as of 11/21/2021   ? ?   Reactions  ? Donepezil Other (See Comments)  ? nightmares  ? Ibuprofen Other (See Comments)  ? Pt unable to recall reaction ?Patient is not sure what happens if she takes this medicine.  ? Memantine Other (See Comments)  ? Nightmares   ? Simvastatin Other (See Comments)  ? REACTION: myalgias  ? ?  ? ?  ?Medication List  ?  ? ?  ? Accurate as of November 21, 2021 10:15 AM. If you have any questions, ask your nurse or doctor.  ?  ?  ? ?  ? ?ALPRAZolam 0.5 MG tablet ?Commonly known as: Duanne Moron ?Take 1 tablet (0.5 mg total) by mouth daily as needed for anxiety. ?  ?amLODipine-olmesartan 5-20 MG tablet ?Commonly known as: AZOR ?TAKE 1 TABLET EVERY DAY ?  ?aspirin EC 81 MG tablet ?Take 81 mg by mouth daily. Swallow whole. ?  ?atorvastatin 40 MG tablet ?Commonly known as: LIPITOR ?Take 1 tablet (40 mg total) by mouth daily. At bedtime ?  ?CALCIUM-VITAMIN D PO ?Take 1 tablet by mouth 2 (two) times daily. ?  ?carvedilol 25 MG tablet ?Commonly known as: COREG ?  ?digoxin 0.125 MG tablet ?Commonly known as: LANOXIN ?Take by mouth. ?  ?Fish Oil 1000 MG Caps ?Take 1,000 mg by mouth daily. ?  ?  lamoTRIgine 25 MG tablet ?Commonly known as: LAMICTAL ?Take 50 mg by mouth 2 (two) times daily. ?  ?levothyroxine 112 MCG tablet ?Commonly known as: SYNTHROID ?Take 1 tablet (112 mcg total) by mouth daily. Take 1.5 tabs on Saturdays. ?  ?pantoprazole 40 MG tablet ?Commonly known as: PROTONIX ?Take 1 tablet (40 mg total) by mouth daily. ?  ?PRESERVISION AREDS 2 PO ?Take by mouth. ?  ?SELENIMIN PO ?Take by mouth. ?  ?UNABLE TO FIND ?Med Name: Vitamin O ?  ?UNABLE TO FIND ?Med Name: All Day/Energy Greens ?  ? ?  ? ? ?History (reviewed): ?Past Medical  History:  ?Diagnosis Date  ? H. pylori infection   ? treated  ? HOH (hard of hearing)   ? Hyperlipidemia   ? Hypertension   ? Hypothyroidism   ? ?Past Surgical History:  ?Procedure Laterality Date  ? APPENDECTOMY    ? CHOLECYSTECTOMY    ? THYROIDECTOMY    ? partial for goiter  ? ?Family History  ?Problem Relation Age of Onset  ? Cancer Daughter   ? Heart disease Sister   ?     unknown specifics  ? ?Social History  ? ?Socioeconomic History  ? Marital status: Widowed  ?  Spouse name: Not on file  ? Number of children: 5  ? Years of education: 6  ? Highest education level: GED or equivalent  ?Occupational History  ?  Employer: RETIRED  ?Tobacco Use  ? Smoking status: Former  ? Smokeless tobacco: Never  ?Vaping Use  ? Vaping Use: Never used  ?Substance and Sexual Activity  ? Alcohol use: No  ? Drug use: No  ? Sexual activity: Not Currently  ?Other Topics Concern  ? Not on file  ?Social History Narrative  ? She lives alone. She had five children but three have passed away. Watches tv in her free time.  ? ?Social Determinants of Health  ? ?Financial Resource Strain: Low Risk   ? Difficulty of Paying Living Expenses: Not hard at all  ?Food Insecurity: No Food Insecurity  ? Worried About Charity fundraiser in the Last Year: Never true  ? Ran Out of Food in the Last Year: Never true  ?Transportation Needs: No Transportation Needs  ? Lack of Transportation (Medical): No  ? Lack of Transportation (Non-Medical): No  ?Physical Activity: Insufficiently Active  ? Days of Exercise per Week: 7 days  ? Minutes of Exercise per Session: 20 min  ?Stress: No Stress Concern Present  ? Feeling of Stress : Only a little  ?Social Connections: Socially Isolated  ? Frequency of Communication with Friends and Family: More than three times a week  ? Frequency of Social Gatherings with Friends and Family: Three times a week  ? Attends Religious Services: Never  ? Active Member of Clubs or Organizations: No  ? Attends Archivist  Meetings: Never  ? Marital Status: Widowed  ? ? ?Activities of Daily Living ? ?  11/21/2021  ?  9:56 AM  ?In your present state of health, do you have any difficulty performing the following activities:  ?Hearing? 1  ?Comment wears bilateral hearing aids and still has difficulty  ?Vision? 1  ?Comment she sees her opathamalogist often and is getting her injections in her eye.  ?Difficulty concentrating or making decisions? 1  ?Comment some difficulty remembering.  ?Walking or climbing stairs? 1  ?Comment she does not climb stairs.  ?Dressing or bathing? 1  ?Comment she difficulty putting her socks  on.  ?Doing errands, shopping? 1  ?Comment she does not drive anymore; usually goes with her great grand daughter.  ?Preparing Food and eating ? Y  ?Comment she doesnt cook on the stove anymore; she uses microwave, air fryer.  ?Using the Toilet? N  ?In the past six months, have you accidently leaked urine? N  ?Comment wears a pad just incase.  ?Do you have problems with loss of bowel control? N  ?Managing your Medications? Y  ?Comment her grandson helps with that.  ?Managing your Finances? N  ?Housekeeping or managing your Housekeeping? N  ? ? ?Patient Education/ Literacy ?How often do you need to have someone help you when you read instructions, pamphlets, or other written materials from your doctor or pharmacy?: 1 - Never ?What is the last grade level you completed in school?: high school diploma ? ?Exercise ?Current Exercise Habits: Home exercise routine, Type of exercise: strength training/weights;walking, Time (Minutes): 20, Frequency (Times/Week): 7, Weekly Exercise (Minutes/Week): 140, Intensity: Moderate, Exercise limited by: None identified ? ?Diet ?Patient reports consuming 3 meals a day and 2 snack(s) a day ?Patient reports that her primary diet is: Regular ?Patient reports that she does have regular access to food.  ? ?Depression Screen ? ?  11/21/2021  ?  9:58 AM 05/05/2021  ? 11:26 AM 11/06/2020  ? 10:11 AM  01/02/2020  ?  1:40 PM 03/20/2019  ? 10:09 AM 11/14/2018  ?  9:44 AM 08/09/2017  ?  1:57 PM  ?PHQ 2/9 Scores  ?PHQ - 2 Score 0 0 0 1 0 1 1  ?PHQ- 9 Score    4     ?  ? ?Fall Risk ? ?  11/21/2021  ?  9:58 AM 05/05/2021  ? 11

## 2021-11-21 NOTE — Patient Instructions (Addendum)
?MEDICARE ANNUAL WELLNESS VISIT ?Health Maintenance Summary and Written Plan of Care ? ?Tina Graham , ? ?Thank you for allowing me to perform your Medicare Annual Wellness Visit and for your ongoing commitment to your health.  ? ?Health Maintenance & Immunization History ?Health Maintenance  ?Topic Date Due  ? COVID-19 Vaccine (2 - Janssen risk series) 12/07/2021 (Originally 08/28/2020)  ? Zoster Vaccines- Shingrix (1 of 2) 02/20/2022 (Originally 10/09/1947)  ? MAMMOGRAM  10/03/2023 (Originally 02/07/2015)  ? INFLUENZA VACCINE  03/03/2022  ? TETANUS/TDAP  06/28/2026  ? Pneumonia Vaccine 42+ Years old  Completed  ? DEXA SCAN  Completed  ? HPV VACCINES  Aged Out  ? ?Immunization History  ?Administered Date(s) Administered  ? Fluad Quad(high Dose 65+) 05/05/2021  ? Influenza Split 04/22/2011  ? Influenza Whole 04/30/2008, 07/08/2009, 07/08/2010  ? Influenza, High Dose Seasonal PF 06/15/2016, 06/30/2017, 05/27/2018  ? Influenza,inj,Quad PF,6+ Mos 06/08/2013, 05/06/2015  ? Influenza-Unspecified 06/08/2014, 05/16/2019, 05/25/2020  ? Janssen (J&J) SARS-COV-2 Vaccination 11/04/2019  ? Moderna SARS-COV2 Booster Vaccination 07/31/2020  ? Pneumococcal Conjugate-13 02/04/2017  ? Pneumococcal Polysaccharide-23 08/03/2005  ? Pneumococcal-Unspecified 08/03/2005  ? Td 04/17/2005  ? Tdap 06/28/2016  ? ? ?These are the patient goals that we discussed: ? Goals Addressed   ? ?  ?  ?  ?  ?  ? This Visit's Progress  ?   Patient Stated (pt-stated)     ?   11/21/2021 ?AWV Goal: Exercise for General Health ? ?Patient will verbalize understanding of the benefits of increased physical activity: ?Exercising regularly is important. It will improve your overall fitness, flexibility, and endurance. ?Regular exercise also will improve your overall health. It can help you control your weight, reduce stress, and improve your bone density. ?Over the next year, patient will increase physical activity as tolerated with a goal of at least 150 minutes of  moderate physical activity per week.  ?You can tell that you are exercising at a moderate intensity if your heart starts beating faster and you start breathing faster but can still hold a conversation. ?Moderate-intensity exercise ideas include: ?Walking 1 mile (1.6 km) in about 15 minutes ?Biking ?Hiking ?Golfing ?Dancing ?Water aerobics ?Patient will verbalize understanding of everyday activities that increase physical activity by providing examples like the following: ?Yard work, such as: ?Pushing a Conservation officer, nature ?Raking and bagging leaves ?Washing your car ?Pushing a stroller ?Shoveling snow ?Gardening ?Washing windows or floors ?Patient will be able to explain general safety guidelines for exercising:  ?Before you start a new exercise program, talk with your health care provider. ?Do not exercise so much that you hurt yourself, feel dizzy, or get very short of breath. ?Wear comfortable clothes and wear shoes with good support. ?Drink plenty of water while you exercise to prevent dehydration or heat stroke. ?Work out until your breathing and your heartbeat get faster.  ?  ? ?  ?  ? ?This is a list of Health Maintenance Items that are overdue or due now: ?Bone densitometry screening ?Shingrix vaccine ? ?Patient declined bone density screening referral and shingrix vaccine at this time. ? ?Orders/Referrals Placed Today: ?No orders of the defined types were placed in this encounter. ? ?(Contact our referral department at [phone] if you have not spoken with someone about your referral appointment within the next 5 days)  ? ? ?Follow-up Plan ?Follow-up with Hali Marry, MD as planned ?Medicare wellness visit in one year. ?AVS printed and mailed to the patient. ? ? ? ?  ?Health Maintenance,  Female ?Adopting a healthy lifestyle and getting preventive care are important in promoting health and wellness. Ask your health care provider about: ?The right schedule for you to have regular tests and exams. ?Things  you can do on your own to prevent diseases and keep yourself healthy. ?What should I know about diet, weight, and exercise? ?Eat a healthy diet ? ?Eat a diet that includes plenty of vegetables, fruits, low-fat dairy products, and lean protein. ?Do not eat a lot of foods that are high in solid fats, added sugars, or sodium. ?Maintain a healthy weight ?Body mass index (BMI) is used to identify weight problems. It estimates body fat based on height and weight. Your health care provider can help determine your BMI and help you achieve or maintain a healthy weight. ?Get regular exercise ?Get regular exercise. This is one of the most important things you can do for your health. Most adults should: ?Exercise for at least 150 minutes each week. The exercise should increase your heart rate and make you sweat (moderate-intensity exercise). ?Do strengthening exercises at least twice a week. This is in addition to the moderate-intensity exercise. ?Spend less time sitting. Even light physical activity can be beneficial. ?Watch cholesterol and blood lipids ?Have your blood tested for lipids and cholesterol at 86 years of age, then have this test every 5 years. ?Have your cholesterol levels checked more often if: ?Your lipid or cholesterol levels are high. ?You are older than 86 years of age. ?You are at high risk for heart disease. ?What should I know about cancer screening? ?Depending on your health history and family history, you may need to have cancer screening at various ages. This may include screening for: ?Breast cancer. ?Cervical cancer. ?Colorectal cancer. ?Skin cancer. ?Lung cancer. ?What should I know about heart disease, diabetes, and high blood pressure? ?Blood pressure and heart disease ?High blood pressure causes heart disease and increases the risk of stroke. This is more likely to develop in people who have high blood pressure readings or are overweight. ?Have your blood pressure checked: ?Every 3-5 years if you  are 34-14 years of age. ?Every year if you are 41 years old or older. ?Diabetes ?Have regular diabetes screenings. This checks your fasting blood sugar level. Have the screening done: ?Once every three years after age 52 if you are at a normal weight and have a low risk for diabetes. ?More often and at a younger age if you are overweight or have a high risk for diabetes. ?What should I know about preventing infection? ?Hepatitis B ?If you have a higher risk for hepatitis B, you should be screened for this virus. Talk with your health care provider to find out if you are at risk for hepatitis B infection. ?Hepatitis C ?Testing is recommended for: ?Everyone born from [birth year] through 1963. ?Anyone with known risk factors for hepatitis C. ?Sexually transmitted infections (STIs) ?Get screened for STIs, including gonorrhea and chlamydia, if: ?You are sexually active and are younger than 86 years of age. ?You are older than 86 years of age and your health care provider tells you that you are at risk for this type of infection. ?Your sexual activity has changed since you were last screened, and you are at increased risk for chlamydia or gonorrhea. Ask your health care provider if you are at risk. ?Ask your health care provider about whether you are at high risk for HIV. Your health care provider may recommend a prescription medicine to help prevent HIV infection.  If you choose to take medicine to prevent HIV, you should first get tested for HIV. You should then be tested every 3 months for as long as you are taking the medicine. ?Pregnancy ?If you are about to stop having your period (premenopausal) and you may become pregnant, seek counseling before you get pregnant. ?Take 400 to 800 micrograms (mcg) of folic acid every day if you become pregnant. ?Ask for birth control (contraception) if you want to prevent pregnancy. ?Osteoporosis and menopause ?Osteoporosis is a disease in which the bones lose minerals and strength with  aging. This can result in bone fractures. If you are 60 years old or older, or if you are at risk for osteoporosis and fractures, ask your health care provider if you should: ?Be screened for bone loss. ?Ta

## 2021-12-24 DIAGNOSIS — H353112 Nonexudative age-related macular degeneration, right eye, intermediate dry stage: Secondary | ICD-10-CM | POA: Diagnosis not present

## 2021-12-24 DIAGNOSIS — H35033 Hypertensive retinopathy, bilateral: Secondary | ICD-10-CM | POA: Diagnosis not present

## 2021-12-24 DIAGNOSIS — H353221 Exudative age-related macular degeneration, left eye, with active choroidal neovascularization: Secondary | ICD-10-CM | POA: Diagnosis not present

## 2021-12-24 DIAGNOSIS — H179 Unspecified corneal scar and opacity: Secondary | ICD-10-CM | POA: Diagnosis not present

## 2022-01-14 DIAGNOSIS — I495 Sick sinus syndrome: Secondary | ICD-10-CM | POA: Diagnosis not present

## 2022-01-14 DIAGNOSIS — I480 Paroxysmal atrial fibrillation: Secondary | ICD-10-CM | POA: Diagnosis not present

## 2022-01-24 ENCOUNTER — Other Ambulatory Visit: Payer: Self-pay | Admitting: Family Medicine

## 2022-01-24 DIAGNOSIS — F411 Generalized anxiety disorder: Secondary | ICD-10-CM

## 2022-02-05 DIAGNOSIS — H353221 Exudative age-related macular degeneration, left eye, with active choroidal neovascularization: Secondary | ICD-10-CM | POA: Diagnosis not present

## 2022-02-17 DIAGNOSIS — H52209 Unspecified astigmatism, unspecified eye: Secondary | ICD-10-CM | POA: Diagnosis not present

## 2022-02-17 DIAGNOSIS — H5213 Myopia, bilateral: Secondary | ICD-10-CM | POA: Diagnosis not present

## 2022-02-17 DIAGNOSIS — H524 Presbyopia: Secondary | ICD-10-CM | POA: Diagnosis not present

## 2022-02-17 DIAGNOSIS — H353220 Exudative age-related macular degeneration, left eye, stage unspecified: Secondary | ICD-10-CM | POA: Diagnosis not present

## 2022-02-17 DIAGNOSIS — H5203 Hypermetropia, bilateral: Secondary | ICD-10-CM | POA: Diagnosis not present

## 2022-03-15 ENCOUNTER — Other Ambulatory Visit: Payer: Self-pay | Admitting: Family Medicine

## 2022-03-15 DIAGNOSIS — I10 Essential (primary) hypertension: Secondary | ICD-10-CM

## 2022-04-02 DIAGNOSIS — H353221 Exudative age-related macular degeneration, left eye, with active choroidal neovascularization: Secondary | ICD-10-CM | POA: Diagnosis not present

## 2022-04-10 ENCOUNTER — Other Ambulatory Visit: Payer: Self-pay | Admitting: Family Medicine

## 2022-04-10 DIAGNOSIS — E039 Hypothyroidism, unspecified: Secondary | ICD-10-CM

## 2022-04-11 DIAGNOSIS — R079 Chest pain, unspecified: Secondary | ICD-10-CM | POA: Diagnosis not present

## 2022-04-11 DIAGNOSIS — S52022A Displaced fracture of olecranon process without intraarticular extension of left ulna, initial encounter for closed fracture: Secondary | ICD-10-CM | POA: Diagnosis not present

## 2022-04-11 DIAGNOSIS — S4992XA Unspecified injury of left shoulder and upper arm, initial encounter: Secondary | ICD-10-CM | POA: Diagnosis not present

## 2022-04-11 DIAGNOSIS — R918 Other nonspecific abnormal finding of lung field: Secondary | ICD-10-CM | POA: Diagnosis not present

## 2022-04-11 DIAGNOSIS — Z87891 Personal history of nicotine dependence: Secondary | ICD-10-CM | POA: Diagnosis not present

## 2022-04-11 DIAGNOSIS — Z888 Allergy status to other drugs, medicaments and biological substances status: Secondary | ICD-10-CM | POA: Diagnosis not present

## 2022-04-11 DIAGNOSIS — I517 Cardiomegaly: Secondary | ICD-10-CM | POA: Diagnosis not present

## 2022-04-11 DIAGNOSIS — S42402A Unspecified fracture of lower end of left humerus, initial encounter for closed fracture: Secondary | ICD-10-CM | POA: Diagnosis not present

## 2022-04-11 DIAGNOSIS — S42292A Other displaced fracture of upper end of left humerus, initial encounter for closed fracture: Secondary | ICD-10-CM | POA: Diagnosis not present

## 2022-04-11 DIAGNOSIS — Z886 Allergy status to analgesic agent status: Secondary | ICD-10-CM | POA: Diagnosis not present

## 2022-04-11 DIAGNOSIS — M545 Low back pain, unspecified: Secondary | ICD-10-CM | POA: Diagnosis not present

## 2022-04-11 DIAGNOSIS — R296 Repeated falls: Secondary | ICD-10-CM | POA: Diagnosis not present

## 2022-04-11 DIAGNOSIS — W010XXA Fall on same level from slipping, tripping and stumbling without subsequent striking against object, initial encounter: Secondary | ICD-10-CM | POA: Diagnosis not present

## 2022-04-13 DIAGNOSIS — S52022A Displaced fracture of olecranon process without intraarticular extension of left ulna, initial encounter for closed fracture: Secondary | ICD-10-CM | POA: Diagnosis not present

## 2022-04-14 DIAGNOSIS — M79651 Pain in right thigh: Secondary | ICD-10-CM | POA: Diagnosis not present

## 2022-04-16 DIAGNOSIS — K219 Gastro-esophageal reflux disease without esophagitis: Secondary | ICD-10-CM | POA: Diagnosis not present

## 2022-04-16 DIAGNOSIS — G8918 Other acute postprocedural pain: Secondary | ICD-10-CM | POA: Diagnosis not present

## 2022-04-16 DIAGNOSIS — Z87891 Personal history of nicotine dependence: Secondary | ICD-10-CM | POA: Diagnosis not present

## 2022-04-16 DIAGNOSIS — E785 Hyperlipidemia, unspecified: Secondary | ICD-10-CM | POA: Diagnosis not present

## 2022-04-16 DIAGNOSIS — Z8673 Personal history of transient ischemic attack (TIA), and cerebral infarction without residual deficits: Secondary | ICD-10-CM | POA: Diagnosis not present

## 2022-04-16 DIAGNOSIS — I480 Paroxysmal atrial fibrillation: Secondary | ICD-10-CM | POA: Diagnosis not present

## 2022-04-16 DIAGNOSIS — S52032A Displaced fracture of olecranon process with intraarticular extension of left ulna, initial encounter for closed fracture: Secondary | ICD-10-CM | POA: Diagnosis not present

## 2022-04-16 DIAGNOSIS — Z950 Presence of cardiac pacemaker: Secondary | ICD-10-CM | POA: Diagnosis not present

## 2022-04-16 DIAGNOSIS — G4089 Other seizures: Secondary | ICD-10-CM | POA: Diagnosis not present

## 2022-04-16 DIAGNOSIS — E039 Hypothyroidism, unspecified: Secondary | ICD-10-CM | POA: Diagnosis not present

## 2022-04-16 DIAGNOSIS — I495 Sick sinus syndrome: Secondary | ICD-10-CM | POA: Diagnosis not present

## 2022-04-16 DIAGNOSIS — S52022A Displaced fracture of olecranon process without intraarticular extension of left ulna, initial encounter for closed fracture: Secondary | ICD-10-CM | POA: Diagnosis not present

## 2022-04-16 DIAGNOSIS — Z95810 Presence of automatic (implantable) cardiac defibrillator: Secondary | ICD-10-CM | POA: Diagnosis not present

## 2022-04-16 DIAGNOSIS — I10 Essential (primary) hypertension: Secondary | ICD-10-CM | POA: Diagnosis not present

## 2022-04-17 DIAGNOSIS — Z950 Presence of cardiac pacemaker: Secondary | ICD-10-CM | POA: Diagnosis not present

## 2022-04-17 DIAGNOSIS — I480 Paroxysmal atrial fibrillation: Secondary | ICD-10-CM | POA: Diagnosis not present

## 2022-04-17 DIAGNOSIS — I10 Essential (primary) hypertension: Secondary | ICD-10-CM | POA: Diagnosis not present

## 2022-04-17 DIAGNOSIS — K219 Gastro-esophageal reflux disease without esophagitis: Secondary | ICD-10-CM | POA: Diagnosis not present

## 2022-04-17 DIAGNOSIS — E785 Hyperlipidemia, unspecified: Secondary | ICD-10-CM | POA: Diagnosis not present

## 2022-04-17 DIAGNOSIS — I495 Sick sinus syndrome: Secondary | ICD-10-CM | POA: Diagnosis not present

## 2022-04-17 DIAGNOSIS — Z87891 Personal history of nicotine dependence: Secondary | ICD-10-CM | POA: Diagnosis not present

## 2022-04-17 DIAGNOSIS — S52032A Displaced fracture of olecranon process with intraarticular extension of left ulna, initial encounter for closed fracture: Secondary | ICD-10-CM | POA: Diagnosis not present

## 2022-04-17 DIAGNOSIS — Z8673 Personal history of transient ischemic attack (TIA), and cerebral infarction without residual deficits: Secondary | ICD-10-CM | POA: Diagnosis not present

## 2022-04-21 ENCOUNTER — Telehealth: Payer: Self-pay

## 2022-04-21 NOTE — Telephone Encounter (Signed)
Physical therapy clinic called in regards to  needing a verbal approval for evaluation and treatment  of physical therapy for pt's left arm fracture. This request includes   Three visits  week one  Two visits week two One visit week four  At total of eight weeks therapy  Physical therapy was advised  that the pt  needs to follow up with  an orthopedists and needs to be evaluated  by  Social work

## 2022-04-24 DIAGNOSIS — M25522 Pain in left elbow: Secondary | ICD-10-CM | POA: Diagnosis not present

## 2022-05-02 DIAGNOSIS — I4891 Unspecified atrial fibrillation: Secondary | ICD-10-CM | POA: Diagnosis not present

## 2022-05-18 DIAGNOSIS — I480 Paroxysmal atrial fibrillation: Secondary | ICD-10-CM | POA: Diagnosis not present

## 2022-05-18 DIAGNOSIS — I495 Sick sinus syndrome: Secondary | ICD-10-CM | POA: Diagnosis not present

## 2022-05-21 DIAGNOSIS — G6289 Other specified polyneuropathies: Secondary | ICD-10-CM | POA: Diagnosis not present

## 2022-05-21 DIAGNOSIS — G3184 Mild cognitive impairment, so stated: Secondary | ICD-10-CM | POA: Diagnosis not present

## 2022-05-21 DIAGNOSIS — R269 Unspecified abnormalities of gait and mobility: Secondary | ICD-10-CM | POA: Diagnosis not present

## 2022-05-21 DIAGNOSIS — M5459 Other low back pain: Secondary | ICD-10-CM | POA: Diagnosis not present

## 2022-05-22 DIAGNOSIS — M25522 Pain in left elbow: Secondary | ICD-10-CM | POA: Diagnosis not present

## 2022-06-11 DIAGNOSIS — H179 Unspecified corneal scar and opacity: Secondary | ICD-10-CM | POA: Diagnosis not present

## 2022-06-11 DIAGNOSIS — H353112 Nonexudative age-related macular degeneration, right eye, intermediate dry stage: Secondary | ICD-10-CM | POA: Diagnosis not present

## 2022-06-11 DIAGNOSIS — H353221 Exudative age-related macular degeneration, left eye, with active choroidal neovascularization: Secondary | ICD-10-CM | POA: Diagnosis not present

## 2022-06-11 DIAGNOSIS — H35033 Hypertensive retinopathy, bilateral: Secondary | ICD-10-CM | POA: Diagnosis not present

## 2022-06-22 DIAGNOSIS — M25522 Pain in left elbow: Secondary | ICD-10-CM | POA: Diagnosis not present

## 2022-07-22 ENCOUNTER — Other Ambulatory Visit: Payer: Self-pay | Admitting: Family Medicine

## 2022-07-22 DIAGNOSIS — E785 Hyperlipidemia, unspecified: Secondary | ICD-10-CM

## 2022-08-16 DIAGNOSIS — I4891 Unspecified atrial fibrillation: Secondary | ICD-10-CM | POA: Diagnosis not present

## 2022-08-16 DIAGNOSIS — Z950 Presence of cardiac pacemaker: Secondary | ICD-10-CM | POA: Diagnosis not present

## 2022-09-02 ENCOUNTER — Telehealth: Payer: Self-pay | Admitting: Family Medicine

## 2022-09-02 DIAGNOSIS — I10 Essential (primary) hypertension: Secondary | ICD-10-CM

## 2022-09-02 NOTE — Telephone Encounter (Signed)
Please call pt she is due for f/u with labs. Thanks.

## 2022-09-02 NOTE — Telephone Encounter (Signed)
Patient scheduled for 2/19, thanks.

## 2022-09-03 DIAGNOSIS — H353221 Exudative age-related macular degeneration, left eye, with active choroidal neovascularization: Secondary | ICD-10-CM | POA: Diagnosis not present

## 2022-09-21 ENCOUNTER — Ambulatory Visit (INDEPENDENT_AMBULATORY_CARE_PROVIDER_SITE_OTHER): Payer: Medicare HMO | Admitting: Family Medicine

## 2022-09-21 ENCOUNTER — Encounter: Payer: Self-pay | Admitting: Family Medicine

## 2022-09-21 VITALS — BP 127/61 | HR 59 | Ht 60.0 in | Wt 112.0 lb

## 2022-09-21 DIAGNOSIS — R413 Other amnesia: Secondary | ICD-10-CM

## 2022-09-21 DIAGNOSIS — Z79899 Other long term (current) drug therapy: Secondary | ICD-10-CM

## 2022-09-21 DIAGNOSIS — E538 Deficiency of other specified B group vitamins: Secondary | ICD-10-CM

## 2022-09-21 DIAGNOSIS — I10 Essential (primary) hypertension: Secondary | ICD-10-CM

## 2022-09-21 DIAGNOSIS — T148XXA Other injury of unspecified body region, initial encounter: Secondary | ICD-10-CM

## 2022-09-21 DIAGNOSIS — E039 Hypothyroidism, unspecified: Secondary | ICD-10-CM

## 2022-09-21 DIAGNOSIS — D692 Other nonthrombocytopenic purpura: Secondary | ICD-10-CM | POA: Diagnosis not present

## 2022-09-21 DIAGNOSIS — Z950 Presence of cardiac pacemaker: Secondary | ICD-10-CM

## 2022-09-21 DIAGNOSIS — N3000 Acute cystitis without hematuria: Secondary | ICD-10-CM

## 2022-09-21 DIAGNOSIS — E785 Hyperlipidemia, unspecified: Secondary | ICD-10-CM

## 2022-09-21 DIAGNOSIS — R319 Hematuria, unspecified: Secondary | ICD-10-CM | POA: Diagnosis not present

## 2022-09-21 DIAGNOSIS — N183 Chronic kidney disease, stage 3 unspecified: Secondary | ICD-10-CM

## 2022-09-21 NOTE — Assessment & Plan Note (Addendum)
SHe has been losing more hair recently so we discussed checking her TSH since it has been a year to make sure that it is adequate.

## 2022-09-21 NOTE — Assessment & Plan Note (Signed)
Bruising most consistent with senile purpura.  She is on a low-dose aspirin that likely contributes as well.

## 2022-09-21 NOTE — Assessment & Plan Note (Signed)
Overdue to recheck renal function.  Will also check urine microalbumin.  Patient says that while she was trying to get the sample she noticed a little blood in her urine.  So we will send her for urinalysis as well.  Lab Results  Component Value Date   CREATININE 0.95 09/04/2021

## 2022-09-21 NOTE — Assessment & Plan Note (Signed)
History of TIA.  We did not have a long enough appointment today to reassess mental status.  I like to see her back in 6 months and maybe we can do it at that time.

## 2022-09-21 NOTE — Assessment & Plan Note (Signed)
Uncontrolled 

## 2022-09-21 NOTE — Progress Notes (Signed)
Established Patient Office Visit  Subjective   Patient ID: Tina Graham, female    DOB: 01-30-1929  Age: 89 y.o. MRN: [id number]  Chief Complaint  Patient presents with   medication follow up    HPI  Last seen here in our office about a year ago.  Since I last saw her she had had multiple eye appointments for macular degeneration.  She also fell in September and fractured her left elbow.  This required surgery.  She is also been seen by cardiology for sick sinus syndrome and A-fib.  Hypothyroidism - Taking medication regularly in the AM away from food and vitamins, etc. does report more recent hair loss.  Hypertension- Pt denies chest pain, SOB, dizziness, or heart palpitations.  Taking meds as directed w/o problems.  Denies medication side effects.    F/U CDK 3 -no recent changes.  She says she still bruises very easily she is on a baby aspirin 81 mg daily.    ROS    Objective:     BP 127/61 (BP Location: Right Arm, Patient Position: Sitting, Cuff Size: Small)   Pulse (!) 59   Ht 5' (1.524 m)   Wt 112 lb 0.6 oz (50.8 kg)   SpO2 99%   BMI 21.88 kg/m    Physical Exam Vitals and nursing note reviewed.  Constitutional:      Appearance: She is well-developed.  HENT:     Head: Normocephalic and atraumatic.  Cardiovascular:     Rate and Rhythm: Normal rate and regular rhythm.     Heart sounds: Normal heart sounds.  Pulmonary:     Effort: Pulmonary effort is normal.     Breath sounds: Normal breath sounds.  Skin:    General: Skin is warm and dry.  Neurological:     Mental Status: She is alert and oriented to person, place, and time.  Psychiatric:        Behavior: Behavior normal.      No results found for any visits on 09/21/22.    The ASCVD Risk score (Arnett DK, et al., 2019) failed to calculate for the following reasons:   The 2019 ASCVD risk score is only valid for ages 39 to 53    Assessment & Plan:   Problem List Items Addressed This Visit        Cardiovascular and Mediastinum   Purpura senilis ([place])    Bruising most consistent with senile purpura.  She is on a low-dose aspirin that likely contributes as well.      HYPERTENSION, BENIGN    Uncontrolled.       Relevant Orders   COMPLETE METABOLIC PANEL WITH GFR     Endocrine   Hypothyroidism - Primary    SHe has been losing more hair recently so we discussed checking her TSH since it has been a year to make sure that it is adequate.      Relevant Orders   TSH     Genitourinary   CKD (chronic kidney disease) stage 3, GFR 30-59 ml/min (HCC)    Overdue to recheck renal function.  Will also check urine microalbumin.  Patient says that while she was trying to get the sample she noticed a little blood in her urine.  So we will send her for urinalysis as well.  Lab Results  Component Value Date   CREATININE 0.95 09/04/2021        Relevant Orders   COMPLETE METABOLIC PANEL WITH GFR   Urine Microalbumin  w/creat. ratio     Other   Pacemaker    Follows with cardiology.      Impaired memory    History of TIA.  We did not have a long enough appointment today to reassess mental status.  I like to see her back in 6 months and maybe we can do it at that time.      Hyperlipidemia   Relevant Orders   Lipid Panel w/reflex Direct LDL   Other Visit Diagnoses     Low serum vitamin B12       Relevant Orders   Vitamin B12   Medication management       Relevant Orders   TSH   Lipid Panel w/reflex Direct LDL   COMPLETE METABOLIC PANEL WITH GFR   CBC with Differential/Platelet   Vitamin B12   Hematoma       Hematuria, unspecified type       Relevant Orders   Urinalysis, Routine w reflex microscopic       Hematuria noted when giving sample for the urine microalbumin.  Urinalysis sent to the lab as well.  Return in about 6 months (around 03/22/2023) for thyroid, etc .    Beatrice Lecher, MD

## 2022-09-21 NOTE — Assessment & Plan Note (Signed)
Follows with cardiology 

## 2022-09-22 LAB — COMPLETE METABOLIC PANEL WITH GFR
AG Ratio: 1.9 (calc) (ref 1.0–2.5)
ALT: 16 U/L (ref 6–29)
AST: 19 U/L (ref 10–35)
Albumin: 4.1 g/dL (ref 3.6–5.1)
Alkaline phosphatase (APISO): 84 U/L (ref 37–153)
BUN/Creatinine Ratio: 14 (calc) (ref 6–22)
BUN: 13 mg/dL (ref 7–25)
CO2: 28 mmol/L (ref 20–32)
Calcium: 9.8 mg/dL (ref 8.6–10.4)
Chloride: 104 mmol/L (ref 98–110)
Creat: 0.96 mg/dL — ABNORMAL HIGH (ref 0.60–0.95)
Globulin: 2.2 g/dL (calc) (ref 1.9–3.7)
Glucose, Bld: 90 mg/dL (ref 65–99)
Potassium: 4.3 mmol/L (ref 3.5–5.3)
Sodium: 142 mmol/L (ref 135–146)
Total Bilirubin: 1.2 mg/dL (ref 0.2–1.2)
Total Protein: 6.3 g/dL (ref 6.1–8.1)
eGFR: 56 mL/min/{1.73_m2} — ABNORMAL LOW (ref >=60)

## 2022-09-22 LAB — URINALYSIS, ROUTINE W REFLEX MICROSCOPIC
Bilirubin Urine: NEGATIVE
Glucose, UA: NEGATIVE
Hgb urine dipstick: NEGATIVE
Ketones, ur: NEGATIVE
Nitrite: NEGATIVE
Specific Gravity, Urine: 1.012 (ref 1.001–1.035)
pH: 5.5 (ref 5.0–8.0)

## 2022-09-22 LAB — CBC WITH DIFFERENTIAL/PLATELET
Absolute Monocytes: 659 cells/uL (ref 200–950)
Basophils Absolute: 62 cells/uL (ref 0–200)
Basophils Relative: 0.7 %
Eosinophils Absolute: 116 cells/uL (ref 15–500)
Eosinophils Relative: 1.3 %
HCT: 39.9 % (ref 35.0–45.0)
Hemoglobin: 13.3 g/dL (ref 11.7–15.5)
Lymphs Abs: 2287 cells/uL (ref 850–3900)
MCH: 31.8 pg (ref 27.0–33.0)
MCHC: 33.3 g/dL (ref 32.0–36.0)
MCV: 95.5 fL (ref 80.0–100.0)
MPV: 9.7 fL (ref 7.5–12.5)
Monocytes Relative: 7.4 %
Neutro Abs: 5776 cells/uL (ref 1500–7800)
Neutrophils Relative %: 64.9 %
Platelets: 234 10*3/uL (ref 140–400)
RBC: 4.18 10*6/uL (ref 3.80–5.10)
RDW: 12.2 % (ref 11.0–15.0)
Total Lymphocyte: 25.7 %
WBC: 8.9 10*3/uL (ref 3.8–10.8)

## 2022-09-22 LAB — LIPID PANEL W/REFLEX DIRECT LDL
Cholesterol: 108 mg/dL (ref <200)
HDL: 55 mg/dL (ref >=50)
LDL Cholesterol (Calc): 33 mg/dL (calc)
Non-HDL Cholesterol (Calc): 53 mg/dL (calc) (ref <130)
Total CHOL/HDL Ratio: 2 (calc) (ref <5.0)
Triglycerides: 118 mg/dL (ref <150)

## 2022-09-22 LAB — VITAMIN B12: Vitamin B-12: 954 pg/mL (ref 200–1100)

## 2022-09-22 LAB — TSH: TSH: 2.13 mIU/L (ref 0.40–4.50)

## 2022-09-22 MED ORDER — SULFAMETHOXAZOLE-TRIMETHOPRIM 800-160 MG PO TABS: 1.0000 | ORAL_TABLET | Freq: Two times a day (BID) | ORAL | 0 refills | Status: DC

## 2022-09-22 NOTE — Addendum Note (Signed)
Addended by: Beatrice Lecher D on: 09/22/2022 11:55 AM   Modules accepted: Orders

## 2022-09-22 NOTE — Progress Notes (Signed)
Call patient: Overall labs look good but it does look like she had some bacteria on the urine sample some get a send over an antibiotic to clear that up. Sent to [facility]

## 2022-11-09 ENCOUNTER — Other Ambulatory Visit: Payer: Self-pay | Admitting: Family Medicine

## 2022-11-09 DIAGNOSIS — I10 Essential (primary) hypertension: Secondary | ICD-10-CM

## 2022-11-22 DIAGNOSIS — Z950 Presence of cardiac pacemaker: Secondary | ICD-10-CM | POA: Diagnosis not present

## 2022-11-22 DIAGNOSIS — I4891 Unspecified atrial fibrillation: Secondary | ICD-10-CM | POA: Diagnosis not present

## 2022-11-25 ENCOUNTER — Ambulatory Visit (INDEPENDENT_AMBULATORY_CARE_PROVIDER_SITE_OTHER): Payer: Medicare HMO | Admitting: Family Medicine

## 2022-11-25 DIAGNOSIS — Z0000 Encounter for general adult medical examination without abnormal findings: Secondary | ICD-10-CM | POA: Diagnosis not present

## 2022-11-25 NOTE — Patient Instructions (Addendum)
MEDICARE ANNUAL WELLNESS VISIT Health Maintenance Summary and Written Plan of Care  Ms. Tina Graham ,  Thank you for allowing me to perform your Medicare Annual Wellness Visit and for your ongoing commitment to your health.   Health Maintenance & Immunization History Health Maintenance  Topic Date Due   COVID-19 Vaccine (2 - Janssen risk series) 12/11/2022 (Originally 08/28/2020)   Zoster Vaccines- Shingrix (1 of 2) 12/20/2022 (Originally 10/09/1947)   MAMMOGRAM  10/03/2023 (Originally 02/07/2015)   INFLUENZA VACCINE  03/04/2023   Medicare Annual Wellness (AWV)  11/25/2023   DTaP/Tdap/Td (3 - Td or Tdap) 06/28/2026   Pneumonia Vaccine 13+ Years old  Completed   DEXA SCAN  Completed   HPV VACCINES  Aged Out   Immunization History  Administered Date(s) Administered   Fluad Quad(high Dose 65+) 05/05/2021   Influenza Split 04/22/2011   Influenza Whole 04/30/2008, 07/08/2009, 07/08/2010   Influenza, High Dose Seasonal PF 06/15/2016, 06/30/2017, 05/27/2018   Influenza,inj,Quad PF,6+ Mos 06/08/2013, 05/06/2015   Influenza-Unspecified 06/08/2014, 05/16/2019, 05/25/2020   Janssen (J&J) SARS-COV-2 Vaccination 11/04/2019   Moderna SARS-COV2 Booster Vaccination 07/31/2020   Pneumococcal Conjugate-13 02/04/2017   Pneumococcal Polysaccharide-23 08/03/2005   Pneumococcal-Unspecified 08/03/2005   Td 04/17/2005   Tdap 06/28/2016    These are the patient goals that we discussed:  Goals Addressed               This Visit's Progress     Patient Stated (pt-stated)        11/25/2022 AWV Goal: Fall Prevention  Over the next year, patient will decrease their risk for falls by: Using assistive devices, such as a cane or walker, as needed Identifying fall risks within their home and correcting them by: Removing throw rugs Adding handrails to stairs or ramps Removing clutter and keeping a clear pathway throughout the home Increasing light, especially at night Adding shower handles/bars Raising  toilet seat Identifying potential personal risk factors for falls: Medication side effects Incontinence/urgency Vestibular dysfunction Hearing loss Musculoskeletal disorders Neurological disorders Orthostatic hypotension           This is a list of Health Maintenance Items that are overdue or due now: Personalized Health Maintenance & Screening Recommendations  Shingles vaccine - declined Bone density scan - declinedThere are no preventive care reminders to display for this patient.    Orders/Referrals Placed Today: No orders of the defined types were placed in this encounter.  (Contact our referral department at [phone] if you have not spoken with someone about your referral appointment within the next 5 days)    Follow-up Plan Follow-up with Agapito Games, MD as planned Medicare wellness visit in one year.  AVS printed and mailed to the patient.      Health Maintenance, Female Adopting a healthy lifestyle and getting preventive care are important in promoting health and wellness. Ask your health care provider about: The right schedule for you to have regular tests and exams. Things you can do on your own to prevent diseases and keep yourself healthy. What should I know about diet, weight, and exercise? Eat a healthy diet  Eat a diet that includes plenty of vegetables, fruits, low-fat dairy products, and lean protein. Do not eat a lot of foods that are high in solid fats, added sugars, or sodium. Maintain a healthy weight Body mass index (BMI) is used to identify weight problems. It estimates body fat based on height and weight. Your health care provider can help determine your BMI and help you achieve  or maintain a healthy weight. Get regular exercise Get regular exercise. This is one of the most important things you can do for your health. Most adults should: Exercise for at least 150 minutes each week. The exercise should increase your heart rate and  make you sweat (moderate-intensity exercise). Do strengthening exercises at least twice a week. This is in addition to the moderate-intensity exercise. Spend less time sitting. Even light physical activity can be beneficial. Watch cholesterol and blood lipids Have your blood tested for lipids and cholesterol at 87 years of age, then have this test every 5 years. Have your cholesterol levels checked more often if: Your lipid or cholesterol levels are high. You are older than 87 years of age. You are at high risk for heart disease. What should I know about cancer screening? Depending on your health history and family history, you may need to have cancer screening at various ages. This may include screening for: Breast cancer. Cervical cancer. Colorectal cancer. Skin cancer. Lung cancer. What should I know about heart disease, diabetes, and high blood pressure? Blood pressure and heart disease High blood pressure causes heart disease and increases the risk of stroke. This is more likely to develop in people who have high blood pressure readings or are overweight. Have your blood pressure checked: Every 3-5 years if you are 49-44 years of age. Every year if you are 53 years old or older. Diabetes Have regular diabetes screenings. This checks your fasting blood sugar level. Have the screening done: Once every three years after age 21 if you are at a normal weight and have a low risk for diabetes. More often and at a younger age if you are overweight or have a high risk for diabetes. What should I know about preventing infection? Hepatitis B If you have a higher risk for hepatitis B, you should be screened for this virus. Talk with your health care provider to find out if you are at risk for hepatitis B infection. Hepatitis C Testing is recommended for: Everyone born from [birth year] through 1963. Anyone with known risk factors for hepatitis C. Sexually transmitted infections (STIs) Get screened  for STIs, including gonorrhea and chlamydia, if: You are sexually active and are younger than 87 years of age. You are older than 87 years of age and your health care provider tells you that you are at risk for this type of infection. Your sexual activity has changed since you were last screened, and you are at increased risk for chlamydia or gonorrhea. Ask your health care provider if you are at risk. Ask your health care provider about whether you are at high risk for HIV. Your health care provider may recommend a prescription medicine to help prevent HIV infection. If you choose to take medicine to prevent HIV, you should first get tested for HIV. You should then be tested every 3 months for as long as you are taking the medicine. Pregnancy If you are about to stop having your period (premenopausal) and you may become pregnant, seek counseling before you get pregnant. Take 400 to 800 micrograms (mcg) of folic acid every day if you become pregnant. Ask for birth control (contraception) if you want to prevent pregnancy. Osteoporosis and menopause Osteoporosis is a disease in which the bones lose minerals and strength with aging. This can result in bone fractures. If you are 62 years old or older, or if you are at risk for osteoporosis and fractures, ask your health care provider if you  should: Be screened for bone loss. Take a calcium or vitamin D supplement to lower your risk of fractures. Be given hormone replacement therapy (HRT) to treat symptoms of menopause. Follow these instructions at home: Alcohol use Do not drink alcohol if: Your health care provider tells you not to drink. You are pregnant, may be pregnant, or are planning to become pregnant. If you drink alcohol: Limit how much you have to: 0-1 drink a day. Know how much alcohol is in your drink. In the [place] one drink equals one 12 oz bottle of beer (355 mL), one 5 oz glass of wine (148 mL), or one 1 oz glass of hard liquor (44  mL). Lifestyle Do not use any products that contain nicotine or tobacco. These products include cigarettes, chewing tobacco, and vaping devices, such as e-cigarettes. If you need help quitting, ask your health care provider. Do not use street drugs. Do not share needles. Ask your health care provider for help if you need support or information about quitting drugs. General instructions Schedule regular health, dental, and eye exams. Stay current with your vaccines. Tell your health care provider if: You often feel depressed. You have ever been abused or do not feel safe at home. Summary Adopting a healthy lifestyle and getting preventive care are important in promoting health and wellness. Follow your health care provider's instructions about healthy diet, exercising, and getting tested or screened for diseases. Follow your health care provider's instructions on monitoring your cholesterol and blood pressure. This information is not intended to replace advice given to you by your health care provider. Make sure you discuss any questions you have with your health care provider. Document Revised: 12/09/2020 Document Reviewed: 12/09/2020 Elsevier Patient Education  2023 [employer].

## 2022-11-25 NOTE — Progress Notes (Signed)
MEDICARE ANNUAL WELLNESS VISIT  11/25/2022  Telephone Visit Disclaimer This Medicare AWV was conducted by telephone due to national recommendations for restrictions regarding the COVID-19 Pandemic (e.g. social distancing).  I verified, using two identifiers, that I am speaking with Tina Graham or their authorized healthcare agent. I discussed the limitations, risks, security, and privacy concerns of performing an evaluation and management service by telephone and the potential availability of an in-person appointment in the future. The patient expressed understanding and agreed to proceed.  Location of Patient: Home Location of Provider (nurse): In the office.  Subjective:    Tina Graham is a 89 y.o. female patient of Metheney, Barbarann Ehlers, MD who had a Medicare Annual Wellness Visit today via telephone. Tina Graham is Retired and lives alone. she had 5 children but three has passed away. she reports that she is socially active and does interact with friends/family regularly. she is minimally physically active and enjoys watching television.  Patient Care Team: Agapito Games, MD as PCP - General (Family Medicine) Aletha Halim, MD as Referring Physician (Rheumatology)     11/25/2022    8:24 AM 11/21/2021    9:57 AM 11/06/2020   10:08 AM 03/20/2019   10:09 AM  Advanced Directives  Does Patient Have a Medical Advance Directive? Yes No Yes Yes  Type of Advance Directive Living will  Living will;Healthcare Power of [employer] Power of [place];Living will  Does patient want to make changes to medical advance directive? No - Patient declined  No - Patient declined No - Patient declined  Copy of Healthcare Power of Attorney in Chart?   No - copy requested No - copy requested  Would patient like information on creating a medical advance directive?  No - Patient declined      Hospital Utilization Over the Past 12 Months: # of hospitalizations or ER visits: 1 # of  surgeries: 1  Review of Systems    Patient reports that her overall health is better compared to last year.  History obtained from chart review and the patient  Patient Reported Readings (BP, Pulse, CBG, Weight, etc) none  Pain Assessment Pain : No/denies pain     Current Medications & Allergies (verified) Allergies as of 11/25/2022       Reactions   Donepezil Other (See Comments)   nightmares   Ibuprofen Other (See Comments)   Pt unable to recall reaction Patient is not sure what happens if she takes this medicine.   Memantine Other (See Comments)   Nightmares    Simvastatin Other (See Comments)   REACTION: myalgias        Medication List        Accurate as of November 25, 2022  8:37 AM. If you have any questions, ask your nurse or doctor.          ALPRAZolam 0.5 MG tablet Commonly known as: XANAX TAKE 1 TABLET DAILY AS NEEDED FOR ANXIETY   amLODipine-olmesartan 5-20 MG tablet Commonly known as: AZOR TAKE 1 TABLET EVERY DAY   aspirin EC 81 MG tablet Take 81 mg by mouth daily. Swallow whole.   atorvastatin 40 MG tablet Commonly known as: LIPITOR TAKE 1 TABLET (40 MG TOTAL) BY MOUTH DAILY AT BEDTIME   CALCIUM-VITAMIN D PO Take 1 tablet by mouth 2 (two) times daily.   carvedilol 25 MG tablet Commonly known as: COREG   digoxin 0.125 MG tablet Commonly known as: LANOXIN Take by mouth.   Fish Oil 1000  MG Caps Take 1,000 mg by mouth daily.   lamoTRIgine 25 MG tablet Commonly known as: LAMICTAL Take 50 mg by mouth 2 (two) times daily.   levothyroxine 112 MCG tablet Commonly known as: SYNTHROID TAKE 1 TABLET DAILY; TAKE 1 AND 1/2 TABLETS ON SATURDAYS. NEW DIRECTIONS   pantoprazole 40 MG tablet Commonly known as: PROTONIX Take 1 tablet (40 mg total) by mouth daily.   PRESERVISION AREDS 2 PO Take by mouth.   SELENIMIN PO Take by mouth.   sulfamethoxazole-trimethoprim 800-160 MG tablet Commonly known as: BACTRIM DS Take 1 tablet by mouth 2  (two) times daily.   UNABLE TO FIND Med Name: Vitamin O   UNABLE TO FIND Med Name: All Day/Energy Greens        History (reviewed): Past Medical History:  Diagnosis Date   H. pylori infection    treated   HOH (hard of hearing)    Hyperlipidemia    Hypertension    Hypothyroidism    Past Surgical History:  Procedure Laterality Date   APPENDECTOMY     CHOLECYSTECTOMY     THYROIDECTOMY     partial for goiter   Family History  Problem Relation Age of Onset   Cancer Daughter    Heart disease Sister        unknown specifics   Social History   Socioeconomic History   Marital status: Widowed    Spouse name: Not on file   Number of children: 5   Years of education: 6   Highest education level: GED or equivalent  Occupational History    Employer: RETIRED  Tobacco Use   Smoking status: Former   Smokeless tobacco: Never  Building services engineer Use: Never used  Substance and Sexual Activity   Alcohol use: No   Drug use: No   Sexual activity: Not Currently  Other Topics Concern   Not on file  Social History Narrative   She lives alone. Her grandson lives in [place] and checks in on her two weekly. She had five children but three have passed away. Watches tv in her free time.   Social Determinants of Health   Financial Resource Strain: Low Risk  (11/25/2022)   Overall Financial Resource Strain (CARDIA)    Difficulty of Paying Living Expenses: Not hard at all  Food Insecurity: No Food Insecurity (11/25/2022)   Hunger Vital Sign    Worried About Running Out of Food in the Last Year: Never true    Ran Out of Food in the Last Year: Never true  Transportation Needs: No Transportation Needs (11/25/2022)   PRAPARE - Administrator, Civil Service (Medical): No    Lack of Transportation (Non-Medical): No  Physical Activity: Inactive (11/25/2022)   Exercise Vital Sign    Days of Exercise per Week: 0 days    Minutes of Exercise per Session: 0 min  Stress: No  Stress Concern Present (11/25/2022)   [employer] of Occupational Health - Occupational Stress Questionnaire    Feeling of Stress : Not at all  Social Connections: Socially Isolated (11/25/2022)   Social Connection and Isolation Panel [NHANES]    Frequency of Communication with Friends and Family: More than three times a week    Frequency of Social Gatherings with Friends and Family: Twice a week    Attends Religious Services: Never    Database administrator or Organizations: No    Attends Banker Meetings: Never    Marital Status: Widowed  Activities of Daily Living    11/25/2022    8:30 AM  In your present state of health, do you have any difficulty performing the following activities:  Hearing? 1  Comment bilateral hearing aids  Vision? 1  Comment getting shots in her eyes  Difficulty concentrating or making decisions? 1  Comment sometimes  Walking or climbing stairs? 1  Comment does not climb stairs anymore  Dressing or bathing? 0  Doing errands, shopping? 1  Comment does not drive  Preparing Food and eating ? N  Using the Toilet? N  In the past six months, have you accidently leaked urine? Y  Do you have problems with loss of bowel control? N  Managing your Medications? Y  Comment her grandson sets up the pill box biweekly  Managing your Finances? N  Housekeeping or managing your Housekeeping? N    Patient Education/ Literacy How often do you need to have someone help you when you read instructions, pamphlets, or other written materials from your doctor or pharmacy?: 1 - Never What is the last grade level you completed in school?: GED  Exercise Current Exercise Habits: The patient does not participate in regular exercise at present, Exercise limited by: orthopedic condition(s)  Diet Patient reports consuming 3 meals a day and 1 snack(s) a day Patient reports that her primary diet is: Regular Patient reports that she does have regular access to  food.   Depression Screen    11/25/2022    8:25 AM 09/21/2022    9:21 AM 11/21/2021    9:58 AM 05/05/2021   11:26 AM 11/06/2020   10:11 AM 01/02/2020    1:40 PM 03/20/2019   10:09 AM  PHQ 2/9 Scores  PHQ - 2 Score 0 0 0 0 0 1 0  PHQ- 9 Score      4      Fall Risk    11/25/2022    8:25 AM 09/21/2022    9:21 AM 11/21/2021    9:58 AM 05/05/2021   11:26 AM 11/06/2020   10:10 AM  Fall Risk   Falls in the past year? 1 1 0 1 1  Number falls in past yr: 0 1 0 0 0  Injury with Fall? 1 0 0 1 1  Risk for fall due to : History of fall(s);Impaired mobility History of fall(s) No Fall Risks  Impaired mobility  Follow up Falls evaluation completed;Education provided;Falls prevention discussed Falls evaluation completed Falls evaluation completed Falls evaluation completed Falls evaluation completed;Education provided;Falls prevention discussed     Objective:  Tina Graham seemed alert and oriented and she participated appropriately during our telephone visit.  Blood Pressure Weight BMI  BP Readings from Last 3 Encounters:  09/21/22 127/61  09/25/21 (!) 176/62  09/04/21 (!) 142/65   Wt Readings from Last 3 Encounters:  09/21/22 112 lb 0.6 oz (50.8 kg)  09/25/21 111 lb (50.3 kg)  09/04/21 112 lb (50.8 kg)   BMI Readings from Last 1 Encounters:  09/21/22 21.88 kg/m    *Unable to obtain current vital signs, weight, and BMI due to telephone visit type  Hearing/Vision  Tina Graham did not seem to have difficulty with hearing/understanding during the telephone conversation Reports that she has had a formal eye exam by an eye care professional within the past year Reports that she has not had a formal hearing evaluation within the past year *Unable to fully assess hearing and vision during telephone visit type  Cognitive Function:    11/25/2022  8:32 AM 11/21/2021   10:08 AM 11/06/2020   10:18 AM 03/20/2019   10:14 AM  6CIT Screen  What Year? 0 points 0 points 0 points 0 points  What month? 0  points 0 points 0 points 0 points  What time? 0 points 0 points 0 points 0 points  Count back from 20 0 points 0 points 0 points 0 points  Months in reverse 0 points 0 points 2 points 0 points  Repeat phrase 0 points 0 points 2 points   Total Score 0 points 0 points 4 points    (Normal:0-7, Significant for Dysfunction: >8)  Normal Cognitive Function Screening: Yes   Immunization & Health Maintenance Record Immunization History  Administered Date(s) Administered   Fluad Quad(high Dose 65+) 05/05/2021   Influenza Split 04/22/2011   Influenza Whole 04/30/2008, 07/08/2009, 07/08/2010   Influenza, High Dose Seasonal PF 06/15/2016, 06/30/2017, 05/27/2018   Influenza,inj,Quad PF,6+ Mos 06/08/2013, 05/06/2015   Influenza-Unspecified 06/08/2014, 05/16/2019, 05/25/2020   Janssen (J&J) SARS-COV-2 Vaccination 11/04/2019   Moderna SARS-COV2 Booster Vaccination 07/31/2020   Pneumococcal Conjugate-13 02/04/2017   Pneumococcal Polysaccharide-23 08/03/2005   Pneumococcal-Unspecified 08/03/2005   Td 04/17/2005   Tdap 06/28/2016    Health Maintenance  Topic Date Due   COVID-19 Vaccine (2 - Janssen risk series) 12/11/2022 (Originally 08/28/2020)   Zoster Vaccines- Shingrix (1 of 2) 12/20/2022 (Originally 10/09/1947)   MAMMOGRAM  10/03/2023 (Originally 02/07/2015)   INFLUENZA VACCINE  03/04/2023   Medicare Annual Wellness (AWV)  11/25/2023   DTaP/Tdap/Td (3 - Td or Tdap) 06/28/2026   Pneumonia Vaccine 29+ Years old  Completed   DEXA SCAN  Completed   HPV VACCINES  Aged Out       Assessment  This is a routine wellness examination for Tina Graham.  Health Maintenance: Due or Overdue There are no preventive care reminders to display for this patient.   Tina Graham does not need a referral for [employer] Assistance: Care Management:   no Social Work:    no Prescription Assistance:  no Nutrition/Diabetes Education:  no   Plan:  Personalized Goals  Goals Addressed                This Visit's Progress     Patient Stated (pt-stated)        11/25/2022 AWV Goal: Fall Prevention  Over the next year, patient will decrease their risk for falls by: Using assistive devices, such as a cane or walker, as needed Identifying fall risks within their home and correcting them by: Removing throw rugs Adding handrails to stairs or ramps Removing clutter and keeping a clear pathway throughout the home Increasing light, especially at night Adding shower handles/bars Raising toilet seat Identifying potential personal risk factors for falls: Medication side effects Incontinence/urgency Vestibular dysfunction Hearing loss Musculoskeletal disorders Neurological disorders Orthostatic hypotension         Personalized Health Maintenance & Screening Recommendations  Shingles vaccine  - declined Bone density scan - declined  Lung Cancer Screening Recommended: no (Low Dose CT Chest recommended if Age 62-80 years, 30 pack-year currently smoking OR have quit w/in past 15 years) Hepatitis C Screening recommended: no HIV Screening recommended: no  Advanced Directives: Written information was not prepared per patient's request.  Referrals & Orders No orders of the defined types were placed in this encounter.   Follow-up Plan Follow-up with Agapito Games, MD as planned Medicare wellness visit in one year.  AVS printed and mailed to the patient.   I  have personally reviewed and noted the following in the patient's chart:   Medical and social history Use of alcohol, tobacco or illicit drugs  Current medications and supplements Functional ability and status Nutritional status Physical activity Advanced directives List of other physicians Hospitalizations, surgeries, and ER visits in previous 12 months Vitals Screenings to include cognitive, depression, and falls Referrals and appointments  In addition, I have reviewed and discussed with Tina Graham  certain preventive protocols, quality metrics, and best practice recommendations. A written personalized care plan for preventive services as well as general preventive health recommendations is available and can be mailed to the patient at her request.      Modesto Charon, RN BSN  11/25/2022

## 2022-11-30 ENCOUNTER — Ambulatory Visit: Payer: Medicare HMO | Admitting: Family Medicine

## 2022-12-02 ENCOUNTER — Other Ambulatory Visit: Payer: Self-pay | Admitting: Family Medicine

## 2022-12-02 DIAGNOSIS — E039 Hypothyroidism, unspecified: Secondary | ICD-10-CM

## 2022-12-03 ENCOUNTER — Ambulatory Visit (INDEPENDENT_AMBULATORY_CARE_PROVIDER_SITE_OTHER): Payer: Medicare HMO | Admitting: Family Medicine

## 2022-12-03 ENCOUNTER — Encounter: Payer: Self-pay | Admitting: Family Medicine

## 2022-12-03 VITALS — BP 175/55 | HR 65 | Ht 60.0 in | Wt 112.0 lb

## 2022-12-03 DIAGNOSIS — R809 Proteinuria, unspecified: Secondary | ICD-10-CM

## 2022-12-03 DIAGNOSIS — D692 Other nonthrombocytopenic purpura: Secondary | ICD-10-CM

## 2022-12-03 DIAGNOSIS — N183 Chronic kidney disease, stage 3 unspecified: Secondary | ICD-10-CM | POA: Diagnosis not present

## 2022-12-03 DIAGNOSIS — R413 Other amnesia: Secondary | ICD-10-CM | POA: Diagnosis not present

## 2022-12-03 DIAGNOSIS — I10 Essential (primary) hypertension: Secondary | ICD-10-CM | POA: Diagnosis not present

## 2022-12-03 LAB — POCT UA - MICROALBUMIN
Albumin/Creatinine Ratio, Urine, POC: >300
Creatinine, POC: 10 mg/dL
Microalbumin Ur, POC: 10 mg/L

## 2022-12-03 MED ORDER — AMLODIPINE-OLMESARTAN 10-40 MG PO TABS: 1.0000 | ORAL_TABLET | Freq: Every day | ORAL | 3 refills | Status: DC

## 2022-12-03 NOTE — Assessment & Plan Note (Signed)
Still taking a daily baby aspirin which is likely contributing to some of the bruising but otherwise benign.

## 2022-12-03 NOTE — Progress Notes (Signed)
Established Patient Office Visit  Subjective   Patient ID: Tina Graham, female    DOB: 08/09/1928  Age: 89 y.o. MRN: [id number]  Chief Complaint  Patient presents with   Hypertension    HPI   Hypertension- Pt denies chest pain, SOB, dizziness, or heart palpitations.  Taking meds as directed w/o problems.  Denies medication side effects.  She says her blood pressures have been running high again and they just seem to swing up and down.  She has been taking her medication regularly and even brought in her bottle with her.  Still taking carvedilol twice a day.  CKD 3 - due for urine microalbumin. We were unable to collect last time.       ROS    Objective:     BP (!) 175/55   Pulse 65   Ht 5' (1.524 m)   Wt 112 lb (50.8 kg)   SpO2 96%   BMI 21.87 kg/m    Physical Exam Vitals and nursing note reviewed.  Constitutional:      Appearance: She is well-developed.  HENT:     Head: Normocephalic and atraumatic.  Cardiovascular:     Rate and Rhythm: Normal rate and regular rhythm.     Heart sounds: Normal heart sounds.  Pulmonary:     Effort: Pulmonary effort is normal.     Breath sounds: Normal breath sounds.  Skin:    General: Skin is warm and dry.  Neurological:     Mental Status: She is alert and oriented to person, place, and time.  Psychiatric:        Behavior: Behavior normal.      Results for orders placed or performed in visit on 12/03/22  POCT UA - Microalbumin  Result Value Ref Range   Microalbumin Ur, POC 10 mg/L   Creatinine, POC 10 mg/dL   Albumin/Creatinine Ratio, Urine, POC >300       The ASCVD Risk score (Arnett DK, et al., 2019) failed to calculate for the following reasons:   The 2019 ASCVD risk score is only valid for ages 82 to 68    Assessment & Plan:   Problem List Items Addressed This Visit       Cardiovascular and Mediastinum   Purpura senilis (HCC)    Still taking a daily baby aspirin which is likely contributing to some  of the bruising but otherwise benign.      Relevant Medications   amLODipine-olmesartan (AZOR) 10-40 MG tablet   HYPERTENSION, BENIGN - Primary    Pressure was high today but it looks absolutely fantastic last time she was here.  Will increase the Azor to 10/40.  She has had some issues with swelling in the past with amlodipine so we will have to keep a close eye on this.  She has issues with urinary incontinence so does not do great with diuretics so we will avoid those.      Relevant Medications   amLODipine-olmesartan (AZOR) 10-40 MG tablet     Genitourinary   CKD (chronic kidney disease) stage 3, GFR 30-59 ml/min (HCC)   Relevant Orders   POCT UA - Microalbumin (Completed)   US Renal     Other   Impaired memory     She is here we will get up-to-date MoCA testing.      Other Visit Diagnoses     Proteinuria, unspecified type       Relevant Orders   US Renal  Microalbumin positive for greater than 300 protein today.  Will schedule for renal ultrasound and plan to recheck proteinuria in 2 to 3 weeks.  Return in about 4 months (around 04/05/2023) for BP, labs,  and memory testing - 40 min appt.    Nani Gasser, MD

## 2022-12-03 NOTE — Assessment & Plan Note (Signed)
Urine microalbumin was positive for proteinuria today.

## 2022-12-03 NOTE — Assessment & Plan Note (Addendum)
Pressure was high today but it looks absolutely fantastic last time she was here.  Will increase the Azor to 10/40.  She has had some issues with swelling in the past with amlodipine so we will have to keep a close eye on this.  She has issues with urinary incontinence so does not do great with diuretics so we will avoid those.

## 2022-12-03 NOTE — Assessment & Plan Note (Signed)
  She is here we will get up-to-date MoCA testing.

## 2022-12-04 NOTE — Addendum Note (Signed)
Addended by: Aryani Daffern D on: 12/04/2022 12:56 PM   Modules accepted: Level of Service  

## 2022-12-10 DIAGNOSIS — H35033 Hypertensive retinopathy, bilateral: Secondary | ICD-10-CM | POA: Diagnosis not present

## 2022-12-10 DIAGNOSIS — H353112 Nonexudative age-related macular degeneration, right eye, intermediate dry stage: Secondary | ICD-10-CM | POA: Diagnosis not present

## 2022-12-10 DIAGNOSIS — H353221 Exudative age-related macular degeneration, left eye, with active choroidal neovascularization: Secondary | ICD-10-CM | POA: Diagnosis not present

## 2022-12-10 DIAGNOSIS — Z961 Presence of intraocular lens: Secondary | ICD-10-CM | POA: Diagnosis not present

## 2022-12-21 ENCOUNTER — Ambulatory Visit (INDEPENDENT_AMBULATORY_CARE_PROVIDER_SITE_OTHER): Payer: Medicare HMO

## 2022-12-21 ENCOUNTER — Telehealth: Payer: Self-pay | Admitting: Family Medicine

## 2022-12-21 DIAGNOSIS — K118 Other diseases of salivary glands: Secondary | ICD-10-CM | POA: Diagnosis not present

## 2022-12-21 DIAGNOSIS — R809 Proteinuria, unspecified: Secondary | ICD-10-CM | POA: Diagnosis not present

## 2022-12-21 DIAGNOSIS — N3289 Other specified disorders of bladder: Secondary | ICD-10-CM

## 2022-12-21 DIAGNOSIS — N183 Chronic kidney disease, stage 3 unspecified: Secondary | ICD-10-CM

## 2022-12-21 DIAGNOSIS — N1330 Unspecified hydronephrosis: Secondary | ICD-10-CM | POA: Diagnosis not present

## 2022-12-21 DIAGNOSIS — N281 Cyst of kidney, acquired: Secondary | ICD-10-CM | POA: Diagnosis not present

## 2022-12-21 NOTE — Telephone Encounter (Signed)
FYI  Patient came into the office to let Dr. Linford Arnold know she is occasionally having black stools when she takes a laxative.

## 2022-12-22 ENCOUNTER — Other Ambulatory Visit: Payer: Self-pay

## 2022-12-22 NOTE — Progress Notes (Signed)
Call pt: [place] of her kidneys show a cyst on the right kidney. It is likely benign but because it is a cm they recommend that we follow it up in 6 months with another ultrasound to make sure that it is not changing.  They did find a cystic structure in the bladder.  It is concerning and needs further workup.  And this could be causing some of the protein issues.  I am going to go ahead and refer for more urgent urologic referral.  They do want a dedicated pelvic ultrasound as well.  But I am not see how quickly we can get her in with a urologist.  If it is going to take a week to get in then we will go ahead and get the pelvic ultrasound done this week.

## 2022-12-23 NOTE — Progress Notes (Signed)
Can you change ref to urology to [facility]. Pt doesn't want to travel. Thank you

## 2022-12-31 ENCOUNTER — Ambulatory Visit (INDEPENDENT_AMBULATORY_CARE_PROVIDER_SITE_OTHER): Payer: Medicare HMO

## 2022-12-31 DIAGNOSIS — N3289 Other specified disorders of bladder: Secondary | ICD-10-CM

## 2022-12-31 DIAGNOSIS — N83291 Other ovarian cyst, right side: Secondary | ICD-10-CM | POA: Diagnosis not present

## 2023-01-05 ENCOUNTER — Other Ambulatory Visit: Payer: Self-pay | Admitting: Family Medicine

## 2023-01-05 DIAGNOSIS — R1900 Intra-abdominal and pelvic swelling, mass and lump, unspecified site: Secondary | ICD-10-CM

## 2023-01-05 NOTE — Progress Notes (Signed)
Please call patient and let her know that the ultrasound showed a complex cystic mass in the right side of the uterus.  They think it could be sitting in the place of the ovary but they are not sure.  The left ovary looks okay.  They recommend an MRI of the pelvis with and without contrast.  I will go ahead and place that order.

## 2023-01-06 DIAGNOSIS — I4719 Other supraventricular tachycardia: Secondary | ICD-10-CM | POA: Diagnosis not present

## 2023-01-06 DIAGNOSIS — Z79899 Other long term (current) drug therapy: Secondary | ICD-10-CM | POA: Diagnosis not present

## 2023-01-06 DIAGNOSIS — Z1330 Encounter for screening examination for mental health and behavioral disorders, unspecified: Secondary | ICD-10-CM | POA: Diagnosis not present

## 2023-01-06 DIAGNOSIS — I4891 Unspecified atrial fibrillation: Secondary | ICD-10-CM | POA: Diagnosis not present

## 2023-01-06 DIAGNOSIS — I480 Paroxysmal atrial fibrillation: Secondary | ICD-10-CM | POA: Diagnosis not present

## 2023-01-06 DIAGNOSIS — I495 Sick sinus syndrome: Secondary | ICD-10-CM | POA: Diagnosis not present

## 2023-01-06 DIAGNOSIS — Z5181 Encounter for therapeutic drug level monitoring: Secondary | ICD-10-CM | POA: Diagnosis not present

## 2023-01-14 ENCOUNTER — Telehealth: Payer: Self-pay

## 2023-01-14 NOTE — Telephone Encounter (Signed)
Tina Graham faxed the Urgent referral. [facility] urology and they did receive the fax and will contact patient for scheduling.

## 2023-01-14 NOTE — Telephone Encounter (Signed)
[facility]) transferred call .  Discussed recent US Renal and Pelvic results.  States she had left a message on Monday on someone's phone but she has not heard back.  Went over results again with patient.  She has not heard from Urgent referral for [facility] and was told the referral was never received. Will forward this information to our referral specialist.  Will mail imaging results to patient as well.  [facility].

## 2023-01-15 ENCOUNTER — Telehealth: Payer: Self-pay

## 2023-01-15 NOTE — Telephone Encounter (Signed)
Spoke with patient states that [facility] imaging told her that they could not schedule her MRI here that they did not have the equipment  needed. She is asking to go to [facility]. ( does the facility need to be changed for the prior auth for this ?)  or can I just fax the order to [facility] in [place]?

## 2023-01-21 NOTE — Progress Notes (Signed)
Referral, clinical notes, imaging, and copies of insurance cards have been faxed to [facility] at [phone]. Office will contact patient to schedule referral appointment.  

## 2023-01-21 NOTE — Telephone Encounter (Signed)
Referral, clinical notes, imaging, and copies of insurance cards have been faxed to [facility] at [phone]. Office will contact patient to schedule referral appointment.

## 2023-01-26 NOTE — Telephone Encounter (Signed)
Task completed. Per patient's request - prefers a [place] facility. She does not want to go to [facility] to have the MRI. A new auth for [facility] facility was obtained on 01/21/23. Contacted [facility] centralized scheduling and was informed patient will need to go to [facility] dept due to pacemaker. As requested, the signed order form, pacemaker and insurance information faxed to [facility] at [phone]. Confirmation rec'd. Imaging facility notify to contact patient for scheduling. A vm msg was left on 01/22/23 for the patient regarding the latest outcome.

## 2023-02-02 NOTE — Telephone Encounter (Signed)
Returned patients call back;  Left message on patients voicemail notifying her that  Referral, clinical notes, imaging, and copies of insurance cards have been faxed to [facility] at [phone]. Office will contact patient to schedule referral appointment.

## 2023-02-23 ENCOUNTER — Telehealth: Payer: Self-pay | Admitting: Family Medicine

## 2023-02-23 NOTE — Telephone Encounter (Signed)
Returned patients call patient back. Patient was given [facility] contact ph# [phone] to confirm upcoming appointment information. Explained to patient that no Ultrasound has been ordered. MR of pelvis has been ordered. Referral has been faxed to [facility] per pt request in previous notes.

## 2023-02-23 NOTE — Telephone Encounter (Signed)
Patient is calling to follow up on Ultrasound scheduling status she states she hasn't heard anything on where she needs to go and when

## 2023-03-03 DIAGNOSIS — N329 Bladder disorder, unspecified: Secondary | ICD-10-CM | POA: Diagnosis not present

## 2023-03-03 DIAGNOSIS — R413 Other amnesia: Secondary | ICD-10-CM | POA: Diagnosis not present

## 2023-03-03 DIAGNOSIS — N3941 Urge incontinence: Secondary | ICD-10-CM | POA: Diagnosis not present

## 2023-03-22 ENCOUNTER — Ambulatory Visit: Payer: Medicare HMO | Admitting: Family Medicine

## 2023-04-01 DIAGNOSIS — H353221 Exudative age-related macular degeneration, left eye, with active choroidal neovascularization: Secondary | ICD-10-CM | POA: Diagnosis not present

## 2023-04-01 DIAGNOSIS — H35033 Hypertensive retinopathy, bilateral: Secondary | ICD-10-CM | POA: Diagnosis not present

## 2023-04-01 DIAGNOSIS — Z961 Presence of intraocular lens: Secondary | ICD-10-CM | POA: Diagnosis not present

## 2023-04-01 DIAGNOSIS — H353112 Nonexudative age-related macular degeneration, right eye, intermediate dry stage: Secondary | ICD-10-CM | POA: Diagnosis not present

## 2023-04-08 ENCOUNTER — Encounter: Payer: Self-pay | Admitting: Family Medicine

## 2023-04-08 ENCOUNTER — Ambulatory Visit (INDEPENDENT_AMBULATORY_CARE_PROVIDER_SITE_OTHER): Payer: Medicare HMO | Admitting: Family Medicine

## 2023-04-08 VITALS — BP 132/55 | HR 62 | Ht 60.0 in | Wt 108.0 lb

## 2023-04-08 DIAGNOSIS — I10 Essential (primary) hypertension: Secondary | ICD-10-CM

## 2023-04-08 DIAGNOSIS — R413 Other amnesia: Secondary | ICD-10-CM | POA: Diagnosis not present

## 2023-04-08 DIAGNOSIS — Z23 Encounter for immunization: Secondary | ICD-10-CM | POA: Diagnosis not present

## 2023-04-08 DIAGNOSIS — R5383 Other fatigue: Secondary | ICD-10-CM | POA: Diagnosis not present

## 2023-04-08 DIAGNOSIS — E039 Hypothyroidism, unspecified: Secondary | ICD-10-CM | POA: Diagnosis not present

## 2023-04-08 DIAGNOSIS — Z950 Presence of cardiac pacemaker: Secondary | ICD-10-CM | POA: Diagnosis not present

## 2023-04-08 DIAGNOSIS — N183 Chronic kidney disease, stage 3 unspecified: Secondary | ICD-10-CM

## 2023-04-08 NOTE — Progress Notes (Signed)
Established Patient Office Visit  Subjective   Patient ID: Tina Graham, female    DOB: 10-28-1928  Age: 89 y.o. MRN: [id number]  Chief Complaint  Patient presents with   Hypertension   memory testing    HPI  Hypertension- Pt denies chest pain, SOB, dizziness, or heart palpitations.  Taking meds as directed w/o problems.  Denies medication side effects.  When she was here last time blood pressure was in the 170s so we did adjust her Azor.  She has noticed that since we adjusted her blood pressure pill her home blood pressures have been much better.  She says she typically checks it about 3 times a day and it is actually been really great lately.  She still struggling with incontinence she has been getting up at night 3-4 times and sometimes soaking through pads.  She did see the urologist recently. She also saw the urologist recently and was given a prescription for Myrbetriq.  Unfortunately her part was quite costly and she really just feels like she cannot afford that she did get the first bottle but did not actually take it she just figures why take it if she is not can to be able to continue to take it.  Follow-up memory impairment-she is also scheduled to do memory evaluation today.  Head CT back in 2019 showed possible old stroke.  Around that time as when she was started on her atorvastatin and aspirin daily.  He has noted that she tires easily she still does all of her own housework but just has to sit down and rest at times.  She says even walking to the mailbox she has to stop and rest before she comes back when I asked her specifically if she was having shortness of breath or muscle tiredness she really was not able to tell me.  She denied any recent chest pain or shortness of breath.    ROS    Objective:     BP (!) 132/55   Pulse 62   Ht 5' (1.524 m)   Wt 108 lb (49 kg)   SpO2 97%   BMI 21.09 kg/m    Physical Exam Vitals and nursing note reviewed.   Constitutional:      Appearance: Normal appearance.  HENT:     Head: Normocephalic and atraumatic.  Eyes:     Conjunctiva/sclera: Conjunctivae normal.  Cardiovascular:     Rate and Rhythm: Normal rate and regular rhythm.  Pulmonary:     Effort: Pulmonary effort is normal.     Breath sounds: Normal breath sounds.  Skin:    General: Skin is warm and dry.  Neurological:     Mental Status: She is alert.  Psychiatric:        Mood and Affect: Mood normal.      No results found for any visits on 04/08/23.    The ASCVD Risk score (Arnett DK, et al., 2019) failed to calculate for the following reasons:   The 2019 ASCVD risk score is only valid for ages 20 to 25    Assessment & Plan:   Problem List Items Addressed This Visit       Cardiovascular and Mediastinum   HYPERTENSION, BENIGN - Primary    Continue Azor and carvedilol.  Blood pressure looks fantastic today and she has been getting some great numbers at home. Note, she did not tolerate 10 mg of amlodipine in the past because of lower extremity swelling.  Relevant Orders   CMP14+EGFR   TSH   CBC with Differential/Platelet     Endocrine   Hypothyroidism    She says she is doing well she still taking a half a tab on Saturdays and a whole tab the other 6 days a week.  Plan to recheck TSH today.      Relevant Orders   CMP14+EGFR   TSH   CBC with Differential/Platelet     Genitourinary   CKD (chronic kidney disease) stage 3, GFR 30-59 ml/min (HCC)    Due to recheck renal function.  She was unable to give a sample for urine today.       Relevant Orders   CMP14+EGFR   TSH   CBC with Differential/Platelet   Urine Microalbumin w/creat. ratio     Other   Pacemaker   Impaired memory    MoCA testing performed today.  Score of 17 out of 30 compared to prior MMSE score of 22 out of 30 back in 2017.  She did fair with visual-spatial and naming.  But struggled more with memory and attention. Today score  consistent with moderate impairment. He did discuss potentially starting a medication for her memory but she declined today and says that she will think about it but not right now.      Other Visit Diagnoses     Encounter for immunization       Relevant Orders   Flu Vaccine Trivalent High Dose (Fluad) (Completed)   Other fatigue       Relevant Orders   CMP14+EGFR   TSH   CBC with Differential/Platelet       Return in about 4 months (around 08/08/2023) for BP/thyroid.    Nani Gasser, MD

## 2023-04-08 NOTE — Assessment & Plan Note (Signed)
She says she is doing well she still taking a half a tab on Saturdays and a whole tab the other 6 days a week.  Plan to recheck TSH today.

## 2023-04-08 NOTE — Assessment & Plan Note (Addendum)
MoCA testing performed today.  Score of 17 out of 30 compared to prior MMSE score of 22 out of 30 back in 2017.  She did fair with visual-spatial and naming.  But struggled more with memory and attention. Today score consistent with moderate impairment. He did discuss potentially starting a medication for her memory but she declined today and says that she will think about it but not right now.

## 2023-04-08 NOTE — Assessment & Plan Note (Signed)
Due to recheck renal function.  She was unable to give a sample for urine today.

## 2023-04-08 NOTE — Assessment & Plan Note (Signed)
Continue Azor and carvedilol.  Blood pressure looks fantastic today and she has been getting some great numbers at home. Note, she did not tolerate 10 mg of amlodipine in the past because of lower extremity swelling.

## 2023-04-09 NOTE — Progress Notes (Signed)
Call patient: Kidney function is still okay but it has been trending up slightly over the last 2 years she does not look as well-hydrated though.  I would like to get an ultrasound of the kidneys.  Thyroid function looks great.  Blood count is normal.  Urine test is still pending.  Also can we call imaging and check on the MRI of the pelvis that was ordered I just cannot tell if this was just not scheduled or if patient scheduled and then canceled or if you are still awaiting authorization?

## 2023-04-12 NOTE — Progress Notes (Signed)
Urine test is still pending we may need to call the lab to find out why?

## 2023-04-14 LAB — CBC WITH DIFFERENTIAL/PLATELET
Basophils Absolute: 0.1 10*3/uL (ref 0.0–0.2)
Basos: 1 %
EOS (ABSOLUTE): 0.1 10*3/uL (ref 0.0–0.4)
Eos: 2 %
Hematocrit: 41.3 % (ref 34.0–46.6)
Hemoglobin: 13.5 g/dL (ref 11.1–15.9)
Immature Grans (Abs): 0 10*3/uL (ref 0.0–0.1)
Immature Granulocytes: 0 %
Lymphocytes Absolute: 2.5 10*3/uL (ref 0.7–3.1)
Lymphs: 31 %
MCH: 30.3 pg (ref 26.6–33.0)
MCHC: 32.7 g/dL (ref 31.5–35.7)
MCV: 93 fL (ref 79–97)
Monocytes Absolute: 0.6 10*3/uL (ref 0.1–0.9)
Monocytes: 7 %
Neutrophils Absolute: 4.7 10*3/uL (ref 1.4–7.0)
Neutrophils: 59 %
Platelets: 246 10*3/uL (ref 150–450)
RBC: 4.46 x10E6/uL (ref 3.77–5.28)
RDW: 12.3 % (ref 11.7–15.4)
WBC: 8 10*3/uL (ref 3.4–10.8)

## 2023-04-14 LAB — CMP14+EGFR
ALT: 21 IU/L (ref 0–32)
AST: 22 IU/L (ref 0–40)
Albumin: 4.4 g/dL (ref 3.6–4.6)
Alkaline Phosphatase: 106 IU/L (ref 44–121)
BUN/Creatinine Ratio: 23 (ref 12–28)
BUN: 23 mg/dL (ref 10–36)
Bilirubin Total: 1.1 mg/dL (ref 0.0–1.2)
CO2: 25 mmol/L (ref 20–29)
Calcium: 10.3 mg/dL (ref 8.7–10.3)
Chloride: 102 mmol/L (ref 96–106)
Creatinine, Ser: 1.01 mg/dL — ABNORMAL HIGH (ref 0.57–1.00)
Globulin, Total: 2.2 g/dL (ref 1.5–4.5)
Glucose: 103 mg/dL — ABNORMAL HIGH (ref 70–99)
Potassium: 4.1 mmol/L (ref 3.5–5.2)
Sodium: 142 mmol/L (ref 134–144)
Total Protein: 6.6 g/dL (ref 6.0–8.5)
eGFR: 52 mL/min/{1.73_m2} — ABNORMAL LOW (ref >59)

## 2023-04-14 LAB — MICROALBUMIN / CREATININE URINE RATIO

## 2023-04-14 LAB — TSH: TSH: 2.29 u[IU]/mL (ref 0.450–4.500)

## 2023-04-14 NOTE — Progress Notes (Signed)
OK can repeat urine at next OV. I still think she needs MRI. We have already held off for 4 months. B Ut it is her choice.

## 2023-04-19 ENCOUNTER — Other Ambulatory Visit: Payer: Self-pay | Admitting: Family Medicine

## 2023-04-19 DIAGNOSIS — F411 Generalized anxiety disorder: Secondary | ICD-10-CM

## 2023-04-20 DIAGNOSIS — I4891 Unspecified atrial fibrillation: Secondary | ICD-10-CM | POA: Diagnosis not present

## 2023-04-20 DIAGNOSIS — Z950 Presence of cardiac pacemaker: Secondary | ICD-10-CM | POA: Diagnosis not present

## 2023-05-17 ENCOUNTER — Other Ambulatory Visit: Payer: Self-pay | Admitting: Family Medicine

## 2023-05-17 DIAGNOSIS — E039 Hypothyroidism, unspecified: Secondary | ICD-10-CM

## 2023-05-18 DIAGNOSIS — R1900 Intra-abdominal and pelvic swelling, mass and lump, unspecified site: Secondary | ICD-10-CM | POA: Diagnosis not present

## 2023-05-18 DIAGNOSIS — N83202 Unspecified ovarian cyst, left side: Secondary | ICD-10-CM | POA: Diagnosis not present

## 2023-05-19 ENCOUNTER — Other Ambulatory Visit: Payer: Self-pay

## 2023-05-19 ENCOUNTER — Encounter: Payer: Self-pay | Admitting: Family Medicine

## 2023-05-19 ENCOUNTER — Other Ambulatory Visit: Payer: Self-pay | Admitting: Family Medicine

## 2023-05-19 DIAGNOSIS — R1900 Intra-abdominal and pelvic swelling, mass and lump, unspecified site: Secondary | ICD-10-CM

## 2023-05-19 NOTE — Progress Notes (Signed)
Referral placed.

## 2023-05-19 NOTE — Progress Notes (Signed)
Please call patient and let her know that the MRI shows that it could be a mass on her ovary measuring about 11 cm.  Neck step would be to get her in with a gynecologist oncologist.  If she is okay with this please let me know if she would prefer someone in [place] or [place].

## 2023-05-27 DIAGNOSIS — M5417 Radiculopathy, lumbosacral region: Secondary | ICD-10-CM | POA: Diagnosis not present

## 2023-05-27 DIAGNOSIS — M5412 Radiculopathy, cervical region: Secondary | ICD-10-CM | POA: Diagnosis not present

## 2023-05-27 DIAGNOSIS — G603 Idiopathic progressive neuropathy: Secondary | ICD-10-CM | POA: Diagnosis not present

## 2023-06-03 DIAGNOSIS — N83201 Unspecified ovarian cyst, right side: Secondary | ICD-10-CM | POA: Diagnosis not present

## 2023-06-03 DIAGNOSIS — N838 Other noninflammatory disorders of ovary, fallopian tube and broad ligament: Secondary | ICD-10-CM | POA: Diagnosis not present

## 2023-06-24 DIAGNOSIS — M7918 Myalgia, other site: Secondary | ICD-10-CM | POA: Diagnosis not present

## 2023-06-24 DIAGNOSIS — Z760 Encounter for issue of repeat prescription: Secondary | ICD-10-CM | POA: Diagnosis not present

## 2023-06-24 DIAGNOSIS — Z79899 Other long term (current) drug therapy: Secondary | ICD-10-CM | POA: Diagnosis not present

## 2023-06-24 DIAGNOSIS — G40209 Localization-related (focal) (partial) symptomatic epilepsy and epileptic syndromes with complex partial seizures, not intractable, without status epilepticus: Secondary | ICD-10-CM | POA: Diagnosis not present

## 2023-06-24 DIAGNOSIS — M7912 Myalgia of auxiliary muscles, head and neck: Secondary | ICD-10-CM | POA: Diagnosis not present

## 2023-06-29 DIAGNOSIS — I10 Essential (primary) hypertension: Secondary | ICD-10-CM | POA: Diagnosis not present

## 2023-06-29 DIAGNOSIS — Z01818 Encounter for other preprocedural examination: Secondary | ICD-10-CM | POA: Diagnosis not present

## 2023-06-29 DIAGNOSIS — R1900 Intra-abdominal and pelvic swelling, mass and lump, unspecified site: Secondary | ICD-10-CM | POA: Diagnosis not present

## 2023-06-29 DIAGNOSIS — E039 Hypothyroidism, unspecified: Secondary | ICD-10-CM | POA: Diagnosis not present

## 2023-06-29 DIAGNOSIS — G459 Transient cerebral ischemic attack, unspecified: Secondary | ICD-10-CM | POA: Diagnosis not present

## 2023-06-29 DIAGNOSIS — I4719 Other supraventricular tachycardia: Secondary | ICD-10-CM | POA: Diagnosis not present

## 2023-06-29 DIAGNOSIS — R413 Other amnesia: Secondary | ICD-10-CM | POA: Diagnosis not present

## 2023-06-29 DIAGNOSIS — I480 Paroxysmal atrial fibrillation: Secondary | ICD-10-CM | POA: Diagnosis not present

## 2023-06-29 DIAGNOSIS — I495 Sick sinus syndrome: Secondary | ICD-10-CM | POA: Diagnosis not present

## 2023-07-07 ENCOUNTER — Telehealth: Payer: Self-pay

## 2023-07-07 NOTE — Telephone Encounter (Unsigned)
Copied from CRM [id number]. Topic: Clinical - Medication Question >> Jul 07, 2023 12:28 PM Amy B wrote: Reason for CRM: Patient requests call back from nurse Selena Batten or anyone from the clinic to discuss her medications.  Please call [phone].

## 2023-07-13 DIAGNOSIS — N83291 Other ovarian cyst, right side: Secondary | ICD-10-CM | POA: Diagnosis not present

## 2023-07-13 DIAGNOSIS — N83292 Other ovarian cyst, left side: Secondary | ICD-10-CM | POA: Diagnosis not present

## 2023-07-13 DIAGNOSIS — R1900 Intra-abdominal and pelvic swelling, mass and lump, unspecified site: Secondary | ICD-10-CM | POA: Diagnosis not present

## 2023-07-13 DIAGNOSIS — N183 Chronic kidney disease, stage 3 unspecified: Secondary | ICD-10-CM | POA: Diagnosis not present

## 2023-07-13 DIAGNOSIS — N8003 Adenomyosis of the uterus: Secondary | ICD-10-CM | POA: Diagnosis not present

## 2023-07-13 DIAGNOSIS — K219 Gastro-esophageal reflux disease without esophagitis: Secondary | ICD-10-CM | POA: Diagnosis not present

## 2023-07-13 DIAGNOSIS — I480 Paroxysmal atrial fibrillation: Secondary | ICD-10-CM | POA: Diagnosis not present

## 2023-07-13 DIAGNOSIS — I129 Hypertensive chronic kidney disease with stage 1 through stage 4 chronic kidney disease, or unspecified chronic kidney disease: Secondary | ICD-10-CM | POA: Diagnosis not present

## 2023-07-13 DIAGNOSIS — Z8041 Family history of malignant neoplasm of ovary: Secondary | ICD-10-CM | POA: Diagnosis not present

## 2023-07-13 DIAGNOSIS — D270 Benign neoplasm of right ovary: Secondary | ICD-10-CM | POA: Diagnosis not present

## 2023-07-13 DIAGNOSIS — D287 Benign neoplasm of other specified female genital organs: Secondary | ICD-10-CM | POA: Diagnosis not present

## 2023-07-13 DIAGNOSIS — N888 Other specified noninflammatory disorders of cervix uteri: Secondary | ICD-10-CM | POA: Diagnosis not present

## 2023-07-13 DIAGNOSIS — D271 Benign neoplasm of left ovary: Secondary | ICD-10-CM | POA: Diagnosis not present

## 2023-07-13 DIAGNOSIS — N72 Inflammatory disease of cervix uteri: Secondary | ICD-10-CM | POA: Diagnosis not present

## 2023-07-14 DIAGNOSIS — I129 Hypertensive chronic kidney disease with stage 1 through stage 4 chronic kidney disease, or unspecified chronic kidney disease: Secondary | ICD-10-CM | POA: Diagnosis not present

## 2023-07-14 DIAGNOSIS — D270 Benign neoplasm of right ovary: Secondary | ICD-10-CM | POA: Diagnosis not present

## 2023-07-14 DIAGNOSIS — N8003 Adenomyosis of the uterus: Secondary | ICD-10-CM | POA: Diagnosis not present

## 2023-07-14 DIAGNOSIS — N83292 Other ovarian cyst, left side: Secondary | ICD-10-CM | POA: Diagnosis not present

## 2023-07-14 DIAGNOSIS — N72 Inflammatory disease of cervix uteri: Secondary | ICD-10-CM | POA: Diagnosis not present

## 2023-07-14 DIAGNOSIS — D271 Benign neoplasm of left ovary: Secondary | ICD-10-CM | POA: Diagnosis not present

## 2023-07-14 DIAGNOSIS — N888 Other specified noninflammatory disorders of cervix uteri: Secondary | ICD-10-CM | POA: Diagnosis not present

## 2023-07-14 DIAGNOSIS — Z8041 Family history of malignant neoplasm of ovary: Secondary | ICD-10-CM | POA: Diagnosis not present

## 2023-07-14 DIAGNOSIS — N83291 Other ovarian cyst, right side: Secondary | ICD-10-CM | POA: Diagnosis not present

## 2023-07-20 DIAGNOSIS — Z950 Presence of cardiac pacemaker: Secondary | ICD-10-CM | POA: Diagnosis not present

## 2023-07-20 DIAGNOSIS — I4892 Unspecified atrial flutter: Secondary | ICD-10-CM | POA: Diagnosis not present

## 2023-07-20 DIAGNOSIS — I4891 Unspecified atrial fibrillation: Secondary | ICD-10-CM | POA: Diagnosis not present

## 2023-07-22 DIAGNOSIS — Z961 Presence of intraocular lens: Secondary | ICD-10-CM | POA: Diagnosis not present

## 2023-07-22 DIAGNOSIS — H353112 Nonexudative age-related macular degeneration, right eye, intermediate dry stage: Secondary | ICD-10-CM | POA: Diagnosis not present

## 2023-07-22 DIAGNOSIS — H35033 Hypertensive retinopathy, bilateral: Secondary | ICD-10-CM | POA: Diagnosis not present

## 2023-07-22 DIAGNOSIS — H353221 Exudative age-related macular degeneration, left eye, with active choroidal neovascularization: Secondary | ICD-10-CM | POA: Diagnosis not present

## 2023-08-09 ENCOUNTER — Ambulatory Visit (INDEPENDENT_AMBULATORY_CARE_PROVIDER_SITE_OTHER): Payer: Medicare HMO | Admitting: Family Medicine

## 2023-08-09 ENCOUNTER — Encounter: Payer: Self-pay | Admitting: Family Medicine

## 2023-08-09 VITALS — BP 133/58 | HR 62 | Ht 60.0 in | Wt 108.0 lb

## 2023-08-09 DIAGNOSIS — Z79899 Other long term (current) drug therapy: Secondary | ICD-10-CM

## 2023-08-09 DIAGNOSIS — E039 Hypothyroidism, unspecified: Secondary | ICD-10-CM

## 2023-08-09 DIAGNOSIS — D692 Other nonthrombocytopenic purpura: Secondary | ICD-10-CM | POA: Diagnosis not present

## 2023-08-09 DIAGNOSIS — I10 Essential (primary) hypertension: Secondary | ICD-10-CM

## 2023-08-09 DIAGNOSIS — R413 Other amnesia: Secondary | ICD-10-CM | POA: Diagnosis not present

## 2023-08-09 DIAGNOSIS — I480 Paroxysmal atrial fibrillation: Secondary | ICD-10-CM

## 2023-08-09 NOTE — Progress Notes (Signed)
 Established Patient Office Visit  Subjective  Patient ID: Tina Graham, female    DOB: Mar 09, 1929  Age: 89 y.o. MRN: [id number]  Chief Complaint  Patient presents with   Hypothyroidism   Hypertension    HPI  Hypertension- Pt denies chest pain, SOB, dizziness, or heart palpitations.  Taking meds as directed w/o problems.  Denies medication side effects.  She reports that she checks it 3 times a day and is primarily getting some good numbers.  Hypothyroidism - Taking medication regularly in the AM away from food and vitamins, etc. No recent change to skin, hair, or energy levels.    ROS    Objective:     BP (!) 133/58   Pulse 62   Ht 5' (1.524 m)   Wt 108 lb (49 kg)   SpO2 97%   BMI 21.09 kg/m    Physical Exam Vitals and nursing note reviewed.  Constitutional:      Appearance: Normal appearance.  HENT:     Head: Normocephalic and atraumatic.  Eyes:     Conjunctiva/sclera: Conjunctivae normal.  Cardiovascular:     Rate and Rhythm: Normal rate and regular rhythm.  Pulmonary:     Effort: Pulmonary effort is normal.     Breath sounds: Normal breath sounds.  Skin:    General: Skin is warm and dry.  Neurological:     Mental Status: She is alert.  Psychiatric:        Mood and Affect: Mood normal.      No results found for any visits on 08/09/23.    The ASCVD Risk score (Arnett DK, et al., 2019) failed to calculate for the following reasons:   The 2019 ASCVD risk score is only valid for ages 79 to 39    Assessment & Plan:   Problem List Items Addressed This Visit       Cardiovascular and Mediastinum   Purpura senilis (HCC)   Bruises on forearms and still on ASA daily       Relevant Orders   TSH   Lipid panel   CMP14+EGFR   CBC with Differential/Platelet   PAF (paroxysmal atrial fibrillation) (HCC)   Continue carvedilol , aspirin and digoxin .      HYPERTENSION, BENIGN - Primary   Well controlled. Continue current regimen. Follow up in  38mo        Relevant Orders   TSH   Lipid panel   CMP14+EGFR   CBC with Differential/Platelet     Endocrine   Hypothyroidism   Due to recheck TSH next months.       Relevant Orders   TSH   Lipid panel   CMP14+EGFR   CBC with Differential/Platelet     Other   Impaired memory   Gust again today possibly a medication she had a few more questions about it she had wanted to pick up an over-the-counter supplement but they were quite expensive.  We discussed Aricept again she is open to it but wants to think about it so we will discuss again at her follow-up in 6 months.  At that point she will be due for repeat MOCA.       Other Visit Diagnoses       Medication management       Relevant Orders   TSH   Lipid panel   CMP14+EGFR   CBC with Differential/Platelet       Return in about 6 months (around 02/06/2024) for Hypertension.    Dorothyann Byars,  MD

## 2023-08-09 NOTE — Assessment & Plan Note (Signed)
 Well controlled. Continue current regimen. Follow up in  6 mo

## 2023-08-09 NOTE — Assessment & Plan Note (Signed)
 Bruises on forearms and still on ASA daily

## 2023-08-09 NOTE — Assessment & Plan Note (Signed)
 Due to recheck TSH next months.

## 2023-08-09 NOTE — Assessment & Plan Note (Signed)
 Gust again today possibly a medication she had a few more questions about it she had wanted to pick up an over-the-counter supplement but they were quite expensive.  We discussed Aricept again she is open to it but wants to think about it so we will discuss again at her follow-up in 6 months.  At that point she will be due for repeat MOCA.

## 2023-08-09 NOTE — Assessment & Plan Note (Signed)
 Continue carvedilol, aspirin and digoxin.

## 2023-08-11 NOTE — Telephone Encounter (Signed)
Discussed w/ pt at OV.

## 2023-08-14 DIAGNOSIS — M810 Age-related osteoporosis without current pathological fracture: Secondary | ICD-10-CM | POA: Diagnosis not present

## 2023-08-14 DIAGNOSIS — E785 Hyperlipidemia, unspecified: Secondary | ICD-10-CM | POA: Diagnosis not present

## 2023-08-14 DIAGNOSIS — R569 Unspecified convulsions: Secondary | ICD-10-CM | POA: Diagnosis not present

## 2023-08-14 DIAGNOSIS — K219 Gastro-esophageal reflux disease without esophagitis: Secondary | ICD-10-CM | POA: Diagnosis not present

## 2023-08-14 DIAGNOSIS — I4891 Unspecified atrial fibrillation: Secondary | ICD-10-CM | POA: Diagnosis not present

## 2023-08-14 DIAGNOSIS — I10 Essential (primary) hypertension: Secondary | ICD-10-CM | POA: Diagnosis not present

## 2023-08-14 DIAGNOSIS — Z950 Presence of cardiac pacemaker: Secondary | ICD-10-CM | POA: Diagnosis not present

## 2023-08-14 DIAGNOSIS — E039 Hypothyroidism, unspecified: Secondary | ICD-10-CM | POA: Diagnosis not present

## 2023-08-14 DIAGNOSIS — R2243 Localized swelling, mass and lump, lower limb, bilateral: Secondary | ICD-10-CM | POA: Diagnosis not present

## 2023-08-14 DIAGNOSIS — R600 Localized edema: Secondary | ICD-10-CM | POA: Diagnosis not present

## 2023-09-13 ENCOUNTER — Other Ambulatory Visit: Payer: Self-pay | Admitting: Family Medicine

## 2023-09-13 DIAGNOSIS — E785 Hyperlipidemia, unspecified: Secondary | ICD-10-CM

## 2023-09-23 DIAGNOSIS — G40209 Localization-related (focal) (partial) symptomatic epilepsy and epileptic syndromes with complex partial seizures, not intractable, without status epilepticus: Secondary | ICD-10-CM | POA: Diagnosis not present

## 2023-09-23 DIAGNOSIS — M7912 Myalgia of auxiliary muscles, head and neck: Secondary | ICD-10-CM | POA: Diagnosis not present

## 2023-09-23 DIAGNOSIS — M7918 Myalgia, other site: Secondary | ICD-10-CM | POA: Diagnosis not present

## 2023-09-23 DIAGNOSIS — Z760 Encounter for issue of repeat prescription: Secondary | ICD-10-CM | POA: Diagnosis not present

## 2023-09-23 DIAGNOSIS — Z79899 Other long term (current) drug therapy: Secondary | ICD-10-CM | POA: Diagnosis not present

## 2023-10-06 ENCOUNTER — Inpatient Hospital Stay: Payer: Medicare HMO | Admitting: Family Medicine

## 2023-10-07 ENCOUNTER — Encounter: Payer: Self-pay | Admitting: Family Medicine

## 2023-10-07 ENCOUNTER — Ambulatory Visit (INDEPENDENT_AMBULATORY_CARE_PROVIDER_SITE_OTHER): Admitting: Family Medicine

## 2023-10-07 VITALS — BP 136/68 | HR 62 | Ht 60.0 in | Wt 103.0 lb

## 2023-10-07 DIAGNOSIS — E876 Hypokalemia: Secondary | ICD-10-CM

## 2023-10-07 DIAGNOSIS — E039 Hypothyroidism, unspecified: Secondary | ICD-10-CM

## 2023-10-07 DIAGNOSIS — R413 Other amnesia: Secondary | ICD-10-CM

## 2023-10-07 DIAGNOSIS — I10 Essential (primary) hypertension: Secondary | ICD-10-CM | POA: Diagnosis not present

## 2023-10-07 MED ORDER — RIVASTIGMINE TARTRATE 1.5 MG PO CAPS: 1.5000 mg | ORAL_CAPSULE | Freq: Two times a day (BID) | ORAL | 2 refills | Status: DC

## 2023-10-07 NOTE — Progress Notes (Signed)
 Established Patient Office Visit  Subjective  Patient ID: Tina Graham, female    DOB: Feb 05, 1929  Age: 89 y.o. MRN: [id number]  Chief Complaint  Patient presents with   Hospitalization Follow-up    HPI  Seen in the ED 2 months ago for bilat leg swelling  . Had bilat doppler and  it was negative for DVT. Potassium was borderline low.  She says the swelling event just went away on its own and she is not exactly sure what caused it she says she might of gotten into a little bit more salt.  She tried to keep her feet elevated like they had asked.  She says she is at the point where she wants to consider a memory medicine. We had discussed it previously but at the time she just did not feel like things were bad enough that she needed to consider medication but she is noticing it is harder to remember things that have happened in the past.  And even just remembering things around the house she is struggling a little bit more sometimes she will forget what she was talking about midsentence.  Sometimes she has a little hard time with word finding.  She does have a history of a TIA.  She has tried Aricept and Namenda in the past both of which caused nightmares.    ROS    Objective:     BP 136/68   Pulse 62   Ht 5' (1.524 m)   Wt 103 lb (46.7 kg)   SpO2 100%   BMI 20.12 kg/m    Physical Exam Vitals and nursing note reviewed.  Constitutional:      Appearance: Normal appearance.  HENT:     Head: Normocephalic and atraumatic.  Eyes:     Conjunctiva/sclera: Conjunctivae normal.  Cardiovascular:     Rate and Rhythm: Normal rate and regular rhythm.  Pulmonary:     Effort: Pulmonary effort is normal.     Breath sounds: Normal breath sounds.  Skin:    General: Skin is warm and dry.  Neurological:     Mental Status: She is alert.  Psychiatric:        Mood and Affect: Mood normal.      No results found for any visits on 10/07/23.    The ASCVD Risk score (Arnett DK, et  al., 2019) failed to calculate for the following reasons:   The 2019 ASCVD risk score is only valid for ages 77 to 56    Assessment & Plan:   Problem List Items Addressed This Visit       Cardiovascular and Mediastinum   HYPERTENSION, BENIGN   Initial BP was high, plan to recheck before she leaves.          Endocrine   Hypothyroidism   Plan to recheck TSH.  She feels like her levels might be off she feels like she is losing weight and wonders if maybe her medication may need to be adjusted she is normally around 108 pounds and today she is down 5 pounds she says she has a good appetite and she is eating and she is not skipping any meals.  No pain with swallowing.  She takes 1-1/2 tabs 1 day a week and then a whole tab the other 6 days a week.      Relevant Orders   TSH     Other   Impaired memory   We discussed options we will try to see if we  can get Exelon twice a day covered it is generic.  Would like to do an MMSE at her next office visit.      Relevant Medications   rivastigmine (EXELON) 1.5 MG capsule   Other Visit Diagnoses       Hypokalemia    -  Primary   Relevant Orders   Basic Metabolic Panel (BMET)      Recent labs show hypokalemia so I would like to recheck that today.   Return in about 3 months (around 01/07/2024) for Memory.    Nani Gasser, MD

## 2023-10-07 NOTE — Assessment & Plan Note (Addendum)
 Plan to recheck TSH.  She feels like her levels might be off she feels like she is losing weight and wonders if maybe her medication may need to be adjusted she is normally around 108 pounds and today she is down 5 pounds she says she has a good appetite and she is eating and she is not skipping any meals.  No pain with swallowing.  She takes 1-1/2 tabs 1 day a week and then a whole tab the other 6 days a week.

## 2023-10-07 NOTE — Assessment & Plan Note (Signed)
 We discussed options we will try to see if we can get Exelon twice a day covered it is generic.  Would like to do an MMSE at her next office visit.

## 2023-10-07 NOTE — Assessment & Plan Note (Signed)
 Initial BP was high, plan to recheck before she leaves.

## 2023-10-08 ENCOUNTER — Telehealth: Payer: Self-pay

## 2023-10-08 DIAGNOSIS — R413 Other amnesia: Secondary | ICD-10-CM

## 2023-10-08 LAB — BASIC METABOLIC PANEL
BUN/Creatinine Ratio: 36 — ABNORMAL HIGH (ref 12–28)
BUN: 34 mg/dL (ref 10–36)
CO2: 25 mmol/L (ref 20–29)
Calcium: 9.7 mg/dL (ref 8.7–10.3)
Chloride: 99 mmol/L (ref 96–106)
Creatinine, Ser: 0.94 mg/dL (ref 0.57–1.00)
Glucose: 96 mg/dL (ref 70–99)
Potassium: 5.1 mmol/L (ref 3.5–5.2)
Sodium: 136 mmol/L (ref 134–144)
eGFR: 57 mL/min/{1.73_m2} — ABNORMAL LOW (ref >59)

## 2023-10-08 LAB — TSH: TSH: 1.94 u[IU]/mL (ref 0.450–4.500)

## 2023-10-08 MED ORDER — RIVASTIGMINE TARTRATE 1.5 MG PO CAPS: 1.5000 mg | ORAL_CAPSULE | Freq: Two times a day (BID) | ORAL | 2 refills | Status: DC

## 2023-10-08 NOTE — Telephone Encounter (Signed)
 Copied from CRM [id number]. Topic: Clinical - Prescription Issue >> Oct 08, 2023 10:09 AM Nila Nephew wrote: Reason for CRM: Patient is calling to request that prescription from 03/06 be sent in via [facility] instead of [facility].

## 2023-10-08 NOTE — Telephone Encounter (Signed)
 Prescription moved to preferred pharmacy

## 2023-10-08 NOTE — Progress Notes (Signed)
 Your lab work is within acceptable range and there are no concerning findings.   Thyroid is good and kidneys are stable.

## 2023-10-14 ENCOUNTER — Telehealth: Payer: Self-pay

## 2023-10-14 NOTE — Telephone Encounter (Signed)
 Copied from CRM [id number]. Topic: Clinical - Lab/Test Results >> Oct 13, 2023 11:30 AM Franchot Heidelberg wrote: Reason for CRM: Pt called requesting to speak to a nurse from the clinic. Expressed understanding of lab results from PCP however has questions for a nurse.   Best contact: [phone]

## 2023-10-14 NOTE — Telephone Encounter (Signed)
 Attempted to contact the patient regarding their concern. No answer, phone keeps ringing with no option to leave a message.

## 2023-10-18 ENCOUNTER — Other Ambulatory Visit: Payer: Self-pay | Admitting: Family Medicine

## 2023-10-19 DIAGNOSIS — I4891 Unspecified atrial fibrillation: Secondary | ICD-10-CM | POA: Diagnosis not present

## 2023-10-19 DIAGNOSIS — Z950 Presence of cardiac pacemaker: Secondary | ICD-10-CM | POA: Diagnosis not present

## 2023-10-19 DIAGNOSIS — I4892 Unspecified atrial flutter: Secondary | ICD-10-CM | POA: Diagnosis not present

## 2023-11-08 DIAGNOSIS — W19XXXA Unspecified fall, initial encounter: Secondary | ICD-10-CM | POA: Diagnosis not present

## 2023-11-08 DIAGNOSIS — S52601A Unspecified fracture of lower end of right ulna, initial encounter for closed fracture: Secondary | ICD-10-CM | POA: Diagnosis not present

## 2023-11-08 DIAGNOSIS — Z23 Encounter for immunization: Secondary | ICD-10-CM | POA: Diagnosis not present

## 2023-11-08 DIAGNOSIS — M25521 Pain in right elbow: Secondary | ICD-10-CM | POA: Diagnosis not present

## 2023-11-08 DIAGNOSIS — I10 Essential (primary) hypertension: Secondary | ICD-10-CM | POA: Diagnosis not present

## 2023-11-08 DIAGNOSIS — S42444A Nondisplaced fracture (avulsion) of medial epicondyle of right humerus, initial encounter for closed fracture: Secondary | ICD-10-CM | POA: Diagnosis not present

## 2023-11-08 DIAGNOSIS — R58 Hemorrhage, not elsewhere classified: Secondary | ICD-10-CM | POA: Diagnosis not present

## 2023-11-08 DIAGNOSIS — Z7982 Long term (current) use of aspirin: Secondary | ICD-10-CM | POA: Diagnosis not present

## 2023-11-08 DIAGNOSIS — M542 Cervicalgia: Secondary | ICD-10-CM | POA: Diagnosis not present

## 2023-11-08 DIAGNOSIS — S52501B Unspecified fracture of the lower end of right radius, initial encounter for open fracture type I or II: Secondary | ICD-10-CM | POA: Diagnosis not present

## 2023-11-08 DIAGNOSIS — S0990XA Unspecified injury of head, initial encounter: Secondary | ICD-10-CM | POA: Diagnosis not present

## 2023-11-08 DIAGNOSIS — S52601B Unspecified fracture of lower end of right ulna, initial encounter for open fracture type I or II: Secondary | ICD-10-CM | POA: Diagnosis not present

## 2023-11-08 DIAGNOSIS — S52501A Unspecified fracture of the lower end of right radius, initial encounter for closed fracture: Secondary | ICD-10-CM | POA: Diagnosis not present

## 2023-11-08 DIAGNOSIS — M79631 Pain in right forearm: Secondary | ICD-10-CM | POA: Diagnosis not present

## 2023-11-08 LAB — LAB REPORT - SCANNED: EGFR: 60

## 2023-11-09 ENCOUNTER — Telehealth: Payer: Self-pay

## 2023-11-09 DIAGNOSIS — S52501A Unspecified fracture of the lower end of right radius, initial encounter for closed fracture: Secondary | ICD-10-CM | POA: Diagnosis not present

## 2023-11-09 DIAGNOSIS — S52501B Unspecified fracture of the lower end of right radius, initial encounter for open fracture type I or II: Secondary | ICD-10-CM

## 2023-11-09 DIAGNOSIS — S52601A Unspecified fracture of lower end of right ulna, initial encounter for closed fracture: Secondary | ICD-10-CM | POA: Diagnosis not present

## 2023-11-09 NOTE — Telephone Encounter (Signed)
 Transferred from E2C2 agent - Patient's son Homero Fellers called requesting a home health referral. Per Homero Fellers, the patient fell down and broke her arm. He is requesting for a nurse to come to the home for an hour or so everyday to help patient will daily activities. He mentioned that the hospital did not place a referral.

## 2023-11-09 NOTE — Telephone Encounter (Signed)
Orders Placed This Encounter  Procedures   Ambulatory referral to Home Health    Referral Priority:   Routine    Referral Type:   Home Health Care    Referral Reason:   Specialty Services Required    Requested Specialty:   Home Health Services    Number of Visits Requested:   1    

## 2023-11-10 ENCOUNTER — Telehealth: Payer: Self-pay

## 2023-11-10 NOTE — Transitions of Care (Post Inpatient/ED Visit) (Signed)
 11/10/2023  Name: Tina Graham MRN: [id number] DOB: 1929/03/11  Today's TOC FU Call Status: Today's TOC FU Call Status:: Successful TOC FU Call Completed TOC FU Call Complete Date: 11/10/23 Patient's Name and Date of Birth confirmed.  Transition Care Management Follow-up Telephone Call Date of Discharge: 11/09/23 Discharge Facility: Other ([facility]) Name of Other ([facility]) Discharge Facility: [facility] Type of Discharge: Emergency Department Reason for ED Visit: Other: (fx right radius) How have you been since you were released from the hospital?: Better Any questions or concerns?: No  Items Reviewed: Did you receive and understand the discharge instructions provided?: Yes Medications obtained,verified, and reconciled?: Yes (Medications Reviewed) Any new allergies since your discharge?: No Dietary orders reviewed?: Yes Do you have support at home?: Yes People in Home [RPT]: grandchild(ren), child(ren), adult  Medications Reviewed Today: Medications Reviewed Today     Reviewed by Karena Addison, LPN (Licensed Practical Nurse) on 11/10/23 at 1005  Med List Status: <None>   Medication Order Taking? Sig Documenting Provider Last Dose Status Informant  amLODipine-olmesartan (AZOR) 10-40 MG tablet [id number]  TAKE 1 TABLET EVERY DAY Agapito Games, MD  Active   aspirin EC 81 MG tablet [id number] No Take 81 mg by mouth daily. Swallow whole. [provider] Taking Active   atorvastatin (LIPITOR) 40 MG tablet [id number] No TAKE 1 TABLET AT BEDTIME Agapito Games, MD Taking Active   CALCIUM-VITAMIN D PO [id number] No Take 1 tablet by mouth 2 (two) times daily. [provider] Taking Active   carvedilol (COREG) 25 MG tablet [id number] No  [provider] Taking Active   digoxin (LANOXIN) 0.125 MG tablet [id number] No Take by mouth. [provider] Taking Active   lamoTRIgine (LAMICTAL) 25 MG tablet [id number] No Take 50 mg by mouth 2  (two) times daily.  [provider] Taking Active   levothyroxine (SYNTHROID) 112 MCG tablet [id number] No TAKE 1 TABLET EVERY DAY AND TAKE 1 AND 1/2 TABLETS ON SATURDAYS (NEW DIRECTIONS) Agapito Games, MD Taking Active   Multiple Vitamins-Minerals (PRESERVISION AREDS 2 PO) [id number] No Take by mouth. [provider] Taking Active   Omega-3 Fatty Acids (FISH OIL) 1000 MG CAPS [id number] No Take 1,000 mg by mouth daily. [provider] Taking Active   rivastigmine (EXELON) 1.5 MG capsule [id number]  Take 1 capsule (1.5 mg total) by mouth 2 (two) times daily. Agapito Games, MD  Active   Selenium (SELENIMIN PO) [id number] No Take by mouth. [provider] Taking Active   UNABLE TO FIND [id number] No Med Name: Vitamin O [provider] Taking Active   UNABLE TO FIND [id number] No Med Name: All Day/Energy Greens [provider] Taking Active             Home Care and Equipment/Supplies: Were Home Health Services Ordered?: NA Any new equipment or medical supplies ordered?: NA  Functional Questionnaire: Do you need assistance with bathing/showering or dressing?: Yes Do you need assistance with meal preparation?: Yes Do you need assistance with eating?: No Do you have difficulty maintaining continence: No Do you need assistance with getting out of bed/getting out of a chair/moving?: Yes Do you have difficulty managing or taking your medications?: No  Follow up appointments reviewed: PCP Follow-up appointment confirmed?: Yes Date of PCP follow-up appointment?: 11/17/23 Follow-up Provider: [facility] Follow-up appointment confirmed?: Yes Date of Specialist follow-up appointment?: 11/25/23 Follow-Up Specialty Provider:: ortho Do you need transportation to your follow-up appointment?: No Do you understand care options  if your condition(s) worsen?: Yes-patient verbalized understanding    SIGNATURE Karena Addison, LPN [facility] Nurse Health Advisor Direct Dial [phone]

## 2023-11-10 NOTE — Telephone Encounter (Signed)
 Task completed. Patient has been updated and is aware to contact the clinic if she does not hear from the [facility] for scheduling.

## 2023-11-15 DIAGNOSIS — S52531S Colles' fracture of right radius, sequela: Secondary | ICD-10-CM | POA: Diagnosis not present

## 2023-11-15 DIAGNOSIS — S52531D Colles' fracture of right radius, subsequent encounter for closed fracture with routine healing: Secondary | ICD-10-CM | POA: Diagnosis not present

## 2023-11-16 DIAGNOSIS — E876 Hypokalemia: Secondary | ICD-10-CM | POA: Diagnosis not present

## 2023-11-16 DIAGNOSIS — H9193 Unspecified hearing loss, bilateral: Secondary | ICD-10-CM | POA: Diagnosis not present

## 2023-11-16 DIAGNOSIS — Z8673 Personal history of transient ischemic attack (TIA), and cerebral infarction without residual deficits: Secondary | ICD-10-CM | POA: Diagnosis not present

## 2023-11-16 DIAGNOSIS — R413 Other amnesia: Secondary | ICD-10-CM | POA: Diagnosis not present

## 2023-11-16 DIAGNOSIS — Z792 Long term (current) use of antibiotics: Secondary | ICD-10-CM | POA: Diagnosis not present

## 2023-11-16 DIAGNOSIS — Z9181 History of falling: Secondary | ICD-10-CM | POA: Diagnosis not present

## 2023-11-16 DIAGNOSIS — S52501D Unspecified fracture of the lower end of right radius, subsequent encounter for closed fracture with routine healing: Secondary | ICD-10-CM | POA: Diagnosis not present

## 2023-11-16 DIAGNOSIS — I10 Essential (primary) hypertension: Secondary | ICD-10-CM | POA: Diagnosis not present

## 2023-11-16 DIAGNOSIS — E039 Hypothyroidism, unspecified: Secondary | ICD-10-CM | POA: Diagnosis not present

## 2023-11-17 ENCOUNTER — Encounter: Payer: Self-pay | Admitting: Family Medicine

## 2023-11-17 ENCOUNTER — Ambulatory Visit (INDEPENDENT_AMBULATORY_CARE_PROVIDER_SITE_OTHER): Admitting: Family Medicine

## 2023-11-17 VITALS — BP 130/54 | HR 66 | Ht 60.0 in | Wt 110.0 lb

## 2023-11-17 DIAGNOSIS — S52501D Unspecified fracture of the lower end of right radius, subsequent encounter for closed fracture with routine healing: Secondary | ICD-10-CM | POA: Diagnosis not present

## 2023-11-17 DIAGNOSIS — Z792 Long term (current) use of antibiotics: Secondary | ICD-10-CM | POA: Diagnosis not present

## 2023-11-17 DIAGNOSIS — N183 Chronic kidney disease, stage 3 unspecified: Secondary | ICD-10-CM | POA: Diagnosis not present

## 2023-11-17 DIAGNOSIS — M8000XA Age-related osteoporosis with current pathological fracture, unspecified site, initial encounter for fracture: Secondary | ICD-10-CM

## 2023-11-17 DIAGNOSIS — Z8673 Personal history of transient ischemic attack (TIA), and cerebral infarction without residual deficits: Secondary | ICD-10-CM | POA: Diagnosis not present

## 2023-11-17 DIAGNOSIS — E039 Hypothyroidism, unspecified: Secondary | ICD-10-CM | POA: Diagnosis not present

## 2023-11-17 DIAGNOSIS — S52531B Colles' fracture of right radius, initial encounter for open fracture type I or II: Secondary | ICD-10-CM | POA: Diagnosis not present

## 2023-11-17 DIAGNOSIS — Z9181 History of falling: Secondary | ICD-10-CM | POA: Diagnosis not present

## 2023-11-17 DIAGNOSIS — E876 Hypokalemia: Secondary | ICD-10-CM | POA: Diagnosis not present

## 2023-11-17 DIAGNOSIS — I10 Essential (primary) hypertension: Secondary | ICD-10-CM | POA: Diagnosis not present

## 2023-11-17 DIAGNOSIS — R413 Other amnesia: Secondary | ICD-10-CM

## 2023-11-17 DIAGNOSIS — H9193 Unspecified hearing loss, bilateral: Secondary | ICD-10-CM | POA: Diagnosis not present

## 2023-11-17 MED ORDER — RIVASTIGMINE TARTRATE 3 MG PO CAPS: 3.0000 mg | ORAL_CAPSULE | Freq: Two times a day (BID) | ORAL | 0 refills | Status: DC

## 2023-11-17 MED ORDER — TRAMADOL HCL 50 MG PO TABS: 50.0000 mg | ORAL_TABLET | Freq: Three times a day (TID) | ORAL | 0 refills | Status: AC | PRN

## 2023-11-17 MED ORDER — ALENDRONATE SODIUM 70 MG PO TABS: 70.0000 mg | ORAL_TABLET | ORAL | 0 refills | Status: DC

## 2023-11-17 NOTE — Assessment & Plan Note (Addendum)
 She was diagnosed with osteoporosis back in 2012.  At that time we had recommended a starting alendronate.  She never actually took the medication and her last bone density was in 2015 because she felt she did not need them anymore. Score of right forearm was -3.0 at that time. Will get updated vitamin D level to make sure that levels are adequate for bone healing. I did ask her bring in her calcium and vitamin D that she takes at home she says she takes 1 a day and I just want to make sure that the amount is adequate. Plan to get updated DEXA scan to see where we are at since the last 1 was in 2015.  She would be a great candidate for either Evenity or Prolia but will need to check with insurance to see if it is covered.  In the short-term we did discuss starting a bisphosphonate while we work on seeing if we can get adequate coverage.

## 2023-11-17 NOTE — Assessment & Plan Note (Signed)
 Will get updated BMP today

## 2023-11-17 NOTE — Assessment & Plan Note (Addendum)
 She feels like the Exelon is not really helping she feels like her memory is actually getting a little bit worse.  We discussed increasing the Exelon up to 3 mg and giving that a trial we still have some room to adjust it upward if needed.  She prefers to use mail order so we will go ahead and send in 90-day.  Sent thyroid level looked great.  She also takes selenium for thyroid health.

## 2023-11-17 NOTE — Progress Notes (Signed)
 Established Patient Office Visit  Subjective  Patient ID: Tina Graham, female    DOB: 1929-05-04  Age: 89 y.o. MRN: [id number]  Chief Complaint  Patient presents with   Hospitalization Follow-up    HPI  Tina Graham is a 89 year old female who is here today for ED follow-up. Marland Kitchen  Unfortunately Tina Graham had a fall and was seen at the emergency department at [facility] on April 7.  Tina Graham did have a fracture near her wrist.  Unfortunately Tina Graham suffered a nondisplaced avulsion fracture of the medial epicondyle and closed fracture type I/II open fracture of the distal end of the right radius.  An open fracture of the distal end of the right ulna.  They did splint her.  They also discharged her home on Keflex for 5 to 7 days.  Tina Graham says Tina Graham does take calcium and vitamin D 1 tab daily.  Tina Graham was given a short prescription of oxycodone and also encouraged to use Tylenol for pain control was recommended that Tina Graham follow-up with the orthopedist.Tina Graham said Tina Graham ran out of medication and has been using the Tylenol.  Tina Graham did follow-up with the orthopedist on April 14.  The splint cast was removed and a hard cast was placed with x-rays afterwards.  They are going to see her back in about 4 weeks.    ROS    Objective:     BP (!) 130/54   Pulse 66   Ht 5' (1.524 m)   Wt 110 lb (49.9 kg)   SpO2 100%   BMI 21.48 kg/m    Physical Exam Vitals and nursing note reviewed.  Constitutional:      Appearance: Normal appearance.  HENT:     Head: Normocephalic and atraumatic.  Eyes:     Conjunctiva/sclera: Conjunctivae normal.  Cardiovascular:     Rate and Rhythm: Normal rate and regular rhythm.  Pulmonary:     Effort: Pulmonary effort is normal.     Breath sounds: Normal breath sounds.  Skin:    General: Skin is warm and dry.  Neurological:     Mental Status: Tina Graham is alert.  Psychiatric:        Mood and Affect: Mood normal.      No results found for any visits on 11/17/23.    The ASCVD Risk  score (Arnett DK, et al., 2019) failed to calculate for the following reasons:   The 2019 ASCVD risk score is only valid for ages 1 to 27    Assessment & Plan:   Problem List Items Addressed This Visit       Musculoskeletal and Integument   Osteoporosis   Tina Graham was diagnosed with osteoporosis back in 2012.  At that time we had recommended a starting alendronate.  Tina Graham never actually took the medication and her last bone density was in 2015 because Tina Graham felt Tina Graham did not need them anymore. Score of right forearm was -3.0 at that time. Will get updated vitamin D level to make sure that levels are adequate for bone healing. I did ask her bring in her calcium and vitamin D that Tina Graham takes at home Tina Graham says Tina Graham takes 1 a day and I just want to make sure that the amount is adequate. Plan to get updated DEXA scan to see where we are at since the last 1 was in 2015.  Tina Graham would be a great candidate for either Evenity or Prolia but will need to check with insurance to see if it is covered.  In the short-term we did discuss starting a bisphosphonate while we work on seeing if we can get adequate coverage.       Relevant Medications   alendronate (FOSAMAX) 70 MG tablet   Other Relevant Orders   VITAMIN D 25 Hydroxy (Vit-D Deficiency, Fractures)   DG Bone Density   Basic Metabolic Panel (BMET)     Genitourinary   CKD (chronic kidney disease) stage 3, GFR 30-59 ml/min (HCC)   Will get updated BMP today.        Other   Impaired memory   Tina Graham feels like the Exelon is not really helping Tina Graham feels like her memory is actually getting a little bit worse.  We discussed increasing the Exelon up to 3 mg and giving that a trial we still have some room to adjust it upward if needed.  Tina Graham prefers to use mail order so we will go ahead and send in 90-day.  Sent thyroid level looked great.  Tina Graham also takes selenium for thyroid health.      Other Visit Diagnoses       Type I or II open Colles' fracture of right  radius, initial encounter    -  Primary   Relevant Orders   VITAMIN D 25 Hydroxy (Vit-D Deficiency, Fractures)   DG Bone Density   Basic Metabolic Panel (BMET)       Return in about 3 months (around 02/16/2024).    Duaine German, MD

## 2023-11-17 NOTE — Patient Instructions (Addendum)
 I am going to increase your Exelon for your memory up to 3 mg so you should be getting a new capsule from the mail order to start taking.  We do have room to go up even further if you are not noticing improvement.  But we can start by just bumping it up a little bit.  We have ordered a bone density test to get an up-to-date T-score.  Please schedule at your convenience we do those downstairs in our building.  Please bring in your calcium and vitamin D pill when you come next time so I can verify the strength of the pill that you are taking.

## 2023-11-18 ENCOUNTER — Ambulatory Visit

## 2023-11-18 DIAGNOSIS — Z780 Asymptomatic menopausal state: Secondary | ICD-10-CM | POA: Diagnosis not present

## 2023-11-18 DIAGNOSIS — M8000XA Age-related osteoporosis with current pathological fracture, unspecified site, initial encounter for fracture: Secondary | ICD-10-CM | POA: Diagnosis not present

## 2023-11-18 DIAGNOSIS — S52531B Colles' fracture of right radius, initial encounter for open fracture type I or II: Secondary | ICD-10-CM

## 2023-11-18 DIAGNOSIS — Z13820 Encounter for screening for osteoporosis: Secondary | ICD-10-CM | POA: Diagnosis not present

## 2023-11-18 DIAGNOSIS — M810 Age-related osteoporosis without current pathological fracture: Secondary | ICD-10-CM | POA: Diagnosis not present

## 2023-11-18 LAB — BASIC METABOLIC PANEL WITH GFR
BUN/Creatinine Ratio: 22 (ref 12–28)
BUN: 20 mg/dL (ref 10–36)
CO2: 22 mmol/L (ref 20–29)
Calcium: 9.6 mg/dL (ref 8.7–10.3)
Chloride: 103 mmol/L (ref 96–106)
Creatinine, Ser: 0.92 mg/dL (ref 0.57–1.00)
Glucose: 102 mg/dL — ABNORMAL HIGH (ref 70–99)
Potassium: 4.3 mmol/L (ref 3.5–5.2)
Sodium: 140 mmol/L (ref 134–144)
eGFR: 58 mL/min/{1.73_m2} — ABNORMAL LOW (ref >59)

## 2023-11-18 LAB — VITAMIN D 25 HYDROXY (VIT D DEFICIENCY, FRACTURES): Vit D, 25-Hydroxy: 31.5 ng/mL (ref 30.0–100.0)

## 2023-11-18 NOTE — Progress Notes (Signed)
 Call patient: Metabolic panel is okay.  Vitamin D is on the low end of normal just make sure she is taking 25 mcg daily especially through the fall winter and early spring months.  Will also help with her bone healing to get her levels up.

## 2023-11-18 NOTE — Progress Notes (Signed)
 Please increase to 1 tab twice a day 1 in the morning and 1 around dinnertime.

## 2023-11-22 DIAGNOSIS — H9193 Unspecified hearing loss, bilateral: Secondary | ICD-10-CM | POA: Diagnosis not present

## 2023-11-22 DIAGNOSIS — R413 Other amnesia: Secondary | ICD-10-CM | POA: Diagnosis not present

## 2023-11-22 DIAGNOSIS — E039 Hypothyroidism, unspecified: Secondary | ICD-10-CM | POA: Diagnosis not present

## 2023-11-22 DIAGNOSIS — Z792 Long term (current) use of antibiotics: Secondary | ICD-10-CM | POA: Diagnosis not present

## 2023-11-22 DIAGNOSIS — S52501D Unspecified fracture of the lower end of right radius, subsequent encounter for closed fracture with routine healing: Secondary | ICD-10-CM | POA: Diagnosis not present

## 2023-11-22 DIAGNOSIS — I10 Essential (primary) hypertension: Secondary | ICD-10-CM | POA: Diagnosis not present

## 2023-11-22 DIAGNOSIS — E876 Hypokalemia: Secondary | ICD-10-CM | POA: Diagnosis not present

## 2023-11-22 DIAGNOSIS — Z9181 History of falling: Secondary | ICD-10-CM | POA: Diagnosis not present

## 2023-11-22 DIAGNOSIS — Z8673 Personal history of transient ischemic attack (TIA), and cerebral infarction without residual deficits: Secondary | ICD-10-CM | POA: Diagnosis not present

## 2023-11-23 ENCOUNTER — Telehealth: Payer: Self-pay | Admitting: Family Medicine

## 2023-11-23 DIAGNOSIS — E039 Hypothyroidism, unspecified: Secondary | ICD-10-CM | POA: Diagnosis not present

## 2023-11-23 DIAGNOSIS — Z9181 History of falling: Secondary | ICD-10-CM | POA: Diagnosis not present

## 2023-11-23 DIAGNOSIS — R413 Other amnesia: Secondary | ICD-10-CM | POA: Diagnosis not present

## 2023-11-23 DIAGNOSIS — H9193 Unspecified hearing loss, bilateral: Secondary | ICD-10-CM | POA: Diagnosis not present

## 2023-11-23 DIAGNOSIS — I10 Essential (primary) hypertension: Secondary | ICD-10-CM | POA: Diagnosis not present

## 2023-11-23 DIAGNOSIS — Z792 Long term (current) use of antibiotics: Secondary | ICD-10-CM | POA: Diagnosis not present

## 2023-11-23 DIAGNOSIS — E876 Hypokalemia: Secondary | ICD-10-CM | POA: Diagnosis not present

## 2023-11-23 DIAGNOSIS — Z8673 Personal history of transient ischemic attack (TIA), and cerebral infarction without residual deficits: Secondary | ICD-10-CM | POA: Diagnosis not present

## 2023-11-23 DIAGNOSIS — S52501D Unspecified fracture of the lower end of right radius, subsequent encounter for closed fracture with routine healing: Secondary | ICD-10-CM | POA: Diagnosis not present

## 2023-11-23 NOTE — Progress Notes (Signed)
 Call patient: Bone density test shows a T-score of -3.7 which is consistent with osteoporosis.  That means you need to be on a bone builder which we just started that is the alendronate  that you will take once a week every 7 days with a full glass of water on an empty stomach and then make sure that you stay upright for the day so in other words do not take the pill and then lay back down or go to bed.   The current recommendation for osteoporosis treatment includes:   #1 calcium -total of 1200 mg of calcium  daily.  If you eat a very calcium  rich diet you may be able to obtain that without a supplement.  If not, then I recommend calcium  500 mg twice a day.  There are several products over-the-counter such as Caltrate D and Viactiv chews which are great options that contain calcium  and vitamin D . #2 vitamin D -recommend 800 international units daily. #3 exercise-recommend 30 minutes of weightbearing exercise 3 days a week.  Resistance training ,such as doing bands and light weights, can be particularly helpful. #4 medication-if you are not currently on a bone builder, also called a bisphosphonate, then this has been shown to be very helpful in maintaining bone strength, preventing further thinning of the bones, and reducing your risk for fractures.  I would highly recommend that you consider starting 1 of these medications.  If you are okay with that then please let us  know and we will send one to your pharmacy.  If you would like to discuss further we are happy to make an appointment for you so that we can go over options for treatment.

## 2023-11-23 NOTE — Telephone Encounter (Signed)
 Copied from CRM [id number]. Topic: General - Other >> Nov 23, 2023  2:56 PM Brynn Caras wrote: Reason for CRM: [facility] wanted to confirm if a plan of care orders had been received on behalf of this patient. Confirmed three separate orders were faxed on: 04/17- order #:[id number], 04/19 - order #: [id number], and 04/21 - order #:[id number] Callback #:[phone] Ext:[phone]

## 2023-11-24 DIAGNOSIS — I10 Essential (primary) hypertension: Secondary | ICD-10-CM | POA: Diagnosis not present

## 2023-11-24 DIAGNOSIS — Z792 Long term (current) use of antibiotics: Secondary | ICD-10-CM | POA: Diagnosis not present

## 2023-11-24 DIAGNOSIS — S52501D Unspecified fracture of the lower end of right radius, subsequent encounter for closed fracture with routine healing: Secondary | ICD-10-CM | POA: Diagnosis not present

## 2023-11-24 DIAGNOSIS — R413 Other amnesia: Secondary | ICD-10-CM | POA: Diagnosis not present

## 2023-11-24 DIAGNOSIS — Z8673 Personal history of transient ischemic attack (TIA), and cerebral infarction without residual deficits: Secondary | ICD-10-CM | POA: Diagnosis not present

## 2023-11-24 DIAGNOSIS — H9193 Unspecified hearing loss, bilateral: Secondary | ICD-10-CM | POA: Diagnosis not present

## 2023-11-24 DIAGNOSIS — E039 Hypothyroidism, unspecified: Secondary | ICD-10-CM | POA: Diagnosis not present

## 2023-11-24 DIAGNOSIS — Z9181 History of falling: Secondary | ICD-10-CM | POA: Diagnosis not present

## 2023-11-24 DIAGNOSIS — E876 Hypokalemia: Secondary | ICD-10-CM | POA: Diagnosis not present

## 2023-11-25 DIAGNOSIS — Z8781 Personal history of (healed) traumatic fracture: Secondary | ICD-10-CM | POA: Diagnosis not present

## 2023-11-25 DIAGNOSIS — S52531D Colles' fracture of right radius, subsequent encounter for closed fracture with routine healing: Secondary | ICD-10-CM | POA: Diagnosis not present

## 2023-11-25 DIAGNOSIS — M810 Age-related osteoporosis without current pathological fracture: Secondary | ICD-10-CM | POA: Diagnosis not present

## 2023-11-26 ENCOUNTER — Telehealth: Payer: Self-pay

## 2023-11-26 NOTE — Telephone Encounter (Signed)
 Documented this on result note

## 2023-11-26 NOTE — Telephone Encounter (Signed)
 Copied from CRM [id number]. Topic: Clinical - Lab/Test Results >> Nov 26, 2023 11:17 AM Tina Graham wrote: Reason for CRM: Patient returning call from Tina Graham regarding labs, spoke to patient and her grandson and let her know of Dr. Trenia Fritter note to increase to 1 tab twice a day in the morning and around dinnertime for vitamin D . Patient is aware and has no further questions.

## 2023-11-29 ENCOUNTER — Telehealth: Payer: Self-pay

## 2023-11-29 DIAGNOSIS — Z792 Long term (current) use of antibiotics: Secondary | ICD-10-CM | POA: Diagnosis not present

## 2023-11-29 DIAGNOSIS — E876 Hypokalemia: Secondary | ICD-10-CM | POA: Diagnosis not present

## 2023-11-29 DIAGNOSIS — Z9181 History of falling: Secondary | ICD-10-CM | POA: Diagnosis not present

## 2023-11-29 DIAGNOSIS — R413 Other amnesia: Secondary | ICD-10-CM | POA: Diagnosis not present

## 2023-11-29 DIAGNOSIS — H9193 Unspecified hearing loss, bilateral: Secondary | ICD-10-CM | POA: Diagnosis not present

## 2023-11-29 DIAGNOSIS — E039 Hypothyroidism, unspecified: Secondary | ICD-10-CM | POA: Diagnosis not present

## 2023-11-29 DIAGNOSIS — S52501D Unspecified fracture of the lower end of right radius, subsequent encounter for closed fracture with routine healing: Secondary | ICD-10-CM | POA: Diagnosis not present

## 2023-11-29 DIAGNOSIS — Z8673 Personal history of transient ischemic attack (TIA), and cerebral infarction without residual deficits: Secondary | ICD-10-CM | POA: Diagnosis not present

## 2023-11-29 DIAGNOSIS — I10 Essential (primary) hypertension: Secondary | ICD-10-CM | POA: Diagnosis not present

## 2023-11-29 NOTE — Telephone Encounter (Signed)
 Copied from CRM [id number]. Topic: General - Other >> Nov 29, 2023 12:16 PM Adrianna P wrote: Reason for CRM: [facility] faxed over 5 orders called to confirm recieval please call [phone] ext [phone] order numbers are: [id number], [id number], [id number], [id number], [phone]

## 2023-11-29 NOTE — Telephone Encounter (Signed)
 Orders placed in mail due to not being able to send via fax. Tried multiple times and different numbers that were given.

## 2023-11-29 NOTE — Telephone Encounter (Signed)
 LVM advising that I had made numerous attempts to fax over these orders and the faxes never went thru. I contacted the office for an alternate number and did try those fax numbers and they also did not work. Informed her that I had placed these in the mail.

## 2023-11-30 DIAGNOSIS — E876 Hypokalemia: Secondary | ICD-10-CM | POA: Diagnosis not present

## 2023-11-30 DIAGNOSIS — Z8673 Personal history of transient ischemic attack (TIA), and cerebral infarction without residual deficits: Secondary | ICD-10-CM | POA: Diagnosis not present

## 2023-11-30 DIAGNOSIS — R413 Other amnesia: Secondary | ICD-10-CM | POA: Diagnosis not present

## 2023-11-30 DIAGNOSIS — H9193 Unspecified hearing loss, bilateral: Secondary | ICD-10-CM | POA: Diagnosis not present

## 2023-11-30 DIAGNOSIS — I10 Essential (primary) hypertension: Secondary | ICD-10-CM | POA: Diagnosis not present

## 2023-11-30 DIAGNOSIS — S52501D Unspecified fracture of the lower end of right radius, subsequent encounter for closed fracture with routine healing: Secondary | ICD-10-CM | POA: Diagnosis not present

## 2023-11-30 DIAGNOSIS — E039 Hypothyroidism, unspecified: Secondary | ICD-10-CM | POA: Diagnosis not present

## 2023-11-30 DIAGNOSIS — Z792 Long term (current) use of antibiotics: Secondary | ICD-10-CM | POA: Diagnosis not present

## 2023-11-30 DIAGNOSIS — Z9181 History of falling: Secondary | ICD-10-CM | POA: Diagnosis not present

## 2023-12-02 DIAGNOSIS — E039 Hypothyroidism, unspecified: Secondary | ICD-10-CM | POA: Diagnosis not present

## 2023-12-02 DIAGNOSIS — Z792 Long term (current) use of antibiotics: Secondary | ICD-10-CM | POA: Diagnosis not present

## 2023-12-02 DIAGNOSIS — S52501D Unspecified fracture of the lower end of right radius, subsequent encounter for closed fracture with routine healing: Secondary | ICD-10-CM | POA: Diagnosis not present

## 2023-12-02 DIAGNOSIS — H9193 Unspecified hearing loss, bilateral: Secondary | ICD-10-CM | POA: Diagnosis not present

## 2023-12-02 DIAGNOSIS — I10 Essential (primary) hypertension: Secondary | ICD-10-CM | POA: Diagnosis not present

## 2023-12-02 DIAGNOSIS — Z9181 History of falling: Secondary | ICD-10-CM | POA: Diagnosis not present

## 2023-12-02 DIAGNOSIS — R413 Other amnesia: Secondary | ICD-10-CM | POA: Diagnosis not present

## 2023-12-02 DIAGNOSIS — E876 Hypokalemia: Secondary | ICD-10-CM | POA: Diagnosis not present

## 2023-12-02 DIAGNOSIS — Z8673 Personal history of transient ischemic attack (TIA), and cerebral infarction without residual deficits: Secondary | ICD-10-CM | POA: Diagnosis not present

## 2023-12-06 ENCOUNTER — Telehealth: Payer: Self-pay

## 2023-12-06 DIAGNOSIS — I10 Essential (primary) hypertension: Secondary | ICD-10-CM | POA: Diagnosis not present

## 2023-12-06 DIAGNOSIS — Z9181 History of falling: Secondary | ICD-10-CM | POA: Diagnosis not present

## 2023-12-06 DIAGNOSIS — Z8673 Personal history of transient ischemic attack (TIA), and cerebral infarction without residual deficits: Secondary | ICD-10-CM | POA: Diagnosis not present

## 2023-12-06 DIAGNOSIS — H9193 Unspecified hearing loss, bilateral: Secondary | ICD-10-CM | POA: Diagnosis not present

## 2023-12-06 DIAGNOSIS — Z792 Long term (current) use of antibiotics: Secondary | ICD-10-CM | POA: Diagnosis not present

## 2023-12-06 DIAGNOSIS — E876 Hypokalemia: Secondary | ICD-10-CM | POA: Diagnosis not present

## 2023-12-06 DIAGNOSIS — E039 Hypothyroidism, unspecified: Secondary | ICD-10-CM | POA: Diagnosis not present

## 2023-12-06 DIAGNOSIS — S52501D Unspecified fracture of the lower end of right radius, subsequent encounter for closed fracture with routine healing: Secondary | ICD-10-CM | POA: Diagnosis not present

## 2023-12-06 DIAGNOSIS — R413 Other amnesia: Secondary | ICD-10-CM | POA: Diagnosis not present

## 2023-12-06 NOTE — Telephone Encounter (Signed)
 Copied from CRM [id number]. Topic: General - Other >> Dec 06, 2023  2:47 PM Tisa Forester wrote: Reason for CRM: [facility]lin [facility] ask to speak with Tonya  Some orders was mailed out by Dr. Duaine German , There is one more order have not received the order number [id number]   And caitlin just refaxed the order want to know if can emailed the order instead of mailing the order  Call back number : [phone] : email address [email or web address]

## 2023-12-07 DIAGNOSIS — Z8673 Personal history of transient ischemic attack (TIA), and cerebral infarction without residual deficits: Secondary | ICD-10-CM | POA: Diagnosis not present

## 2023-12-07 DIAGNOSIS — E039 Hypothyroidism, unspecified: Secondary | ICD-10-CM | POA: Diagnosis not present

## 2023-12-07 DIAGNOSIS — Z792 Long term (current) use of antibiotics: Secondary | ICD-10-CM | POA: Diagnosis not present

## 2023-12-07 DIAGNOSIS — I10 Essential (primary) hypertension: Secondary | ICD-10-CM | POA: Diagnosis not present

## 2023-12-07 DIAGNOSIS — R413 Other amnesia: Secondary | ICD-10-CM | POA: Diagnosis not present

## 2023-12-07 DIAGNOSIS — Z9181 History of falling: Secondary | ICD-10-CM | POA: Diagnosis not present

## 2023-12-07 DIAGNOSIS — S52501D Unspecified fracture of the lower end of right radius, subsequent encounter for closed fracture with routine healing: Secondary | ICD-10-CM | POA: Diagnosis not present

## 2023-12-07 DIAGNOSIS — E876 Hypokalemia: Secondary | ICD-10-CM | POA: Diagnosis not present

## 2023-12-07 DIAGNOSIS — H9193 Unspecified hearing loss, bilateral: Secondary | ICD-10-CM | POA: Diagnosis not present

## 2023-12-08 DIAGNOSIS — Z8673 Personal history of transient ischemic attack (TIA), and cerebral infarction without residual deficits: Secondary | ICD-10-CM | POA: Diagnosis not present

## 2023-12-08 DIAGNOSIS — Z9181 History of falling: Secondary | ICD-10-CM | POA: Diagnosis not present

## 2023-12-08 DIAGNOSIS — Z792 Long term (current) use of antibiotics: Secondary | ICD-10-CM | POA: Diagnosis not present

## 2023-12-08 DIAGNOSIS — R413 Other amnesia: Secondary | ICD-10-CM | POA: Diagnosis not present

## 2023-12-08 DIAGNOSIS — H9193 Unspecified hearing loss, bilateral: Secondary | ICD-10-CM | POA: Diagnosis not present

## 2023-12-08 DIAGNOSIS — E876 Hypokalemia: Secondary | ICD-10-CM | POA: Diagnosis not present

## 2023-12-08 DIAGNOSIS — I10 Essential (primary) hypertension: Secondary | ICD-10-CM | POA: Diagnosis not present

## 2023-12-08 DIAGNOSIS — E039 Hypothyroidism, unspecified: Secondary | ICD-10-CM | POA: Diagnosis not present

## 2023-12-08 DIAGNOSIS — S52501D Unspecified fracture of the lower end of right radius, subsequent encounter for closed fracture with routine healing: Secondary | ICD-10-CM | POA: Diagnosis not present

## 2023-12-13 ENCOUNTER — Telehealth: Payer: Self-pay

## 2023-12-13 DIAGNOSIS — S52531S Colles' fracture of right radius, sequela: Secondary | ICD-10-CM | POA: Diagnosis not present

## 2023-12-13 DIAGNOSIS — S52611D Displaced fracture of right ulna styloid process, subsequent encounter for closed fracture with routine healing: Secondary | ICD-10-CM | POA: Diagnosis not present

## 2023-12-13 DIAGNOSIS — S52571D Other intraarticular fracture of lower end of right radius, subsequent encounter for closed fracture with routine healing: Secondary | ICD-10-CM | POA: Diagnosis not present

## 2023-12-13 DIAGNOSIS — M25522 Pain in left elbow: Secondary | ICD-10-CM | POA: Diagnosis not present

## 2023-12-13 NOTE — Telephone Encounter (Signed)
 Copied from CRM [id number]. Topic: General - Other >> Dec 06, 2023  2:47 PM Tisa Forester wrote: Reason for CRM: caitlin [facility] ask to speak with Tonya  Some orders was mailed out by Dr. Duaine German , There is one more order have not received the order number [id number]   And caitlin just refaxed the order want to know if can emailed the order instead of mailing the order  Call back number : [phone] ext [phone] : email address [email or web address] >> Dec 13, 2023  1:36 PM Carrielelia G wrote: Attn: Dickie Found, LPN  Kaitlin [facility] calling again regarding the status of: one more order they have not received.  The order number is #[id number]   Please advise

## 2023-12-13 NOTE — Telephone Encounter (Signed)
 Orders reprinted and put in Dr. Trenia Fritter box for signing

## 2023-12-14 DIAGNOSIS — R413 Other amnesia: Secondary | ICD-10-CM | POA: Diagnosis not present

## 2023-12-14 DIAGNOSIS — E876 Hypokalemia: Secondary | ICD-10-CM | POA: Diagnosis not present

## 2023-12-14 DIAGNOSIS — S52501D Unspecified fracture of the lower end of right radius, subsequent encounter for closed fracture with routine healing: Secondary | ICD-10-CM | POA: Diagnosis not present

## 2023-12-14 DIAGNOSIS — Z792 Long term (current) use of antibiotics: Secondary | ICD-10-CM | POA: Diagnosis not present

## 2023-12-14 DIAGNOSIS — I10 Essential (primary) hypertension: Secondary | ICD-10-CM | POA: Diagnosis not present

## 2023-12-14 DIAGNOSIS — Z9181 History of falling: Secondary | ICD-10-CM | POA: Diagnosis not present

## 2023-12-14 DIAGNOSIS — H9193 Unspecified hearing loss, bilateral: Secondary | ICD-10-CM | POA: Diagnosis not present

## 2023-12-14 DIAGNOSIS — Z8673 Personal history of transient ischemic attack (TIA), and cerebral infarction without residual deficits: Secondary | ICD-10-CM | POA: Diagnosis not present

## 2023-12-14 DIAGNOSIS — E039 Hypothyroidism, unspecified: Secondary | ICD-10-CM | POA: Diagnosis not present

## 2023-12-14 NOTE — Telephone Encounter (Signed)
 Orders were received this morning.

## 2023-12-14 NOTE — Telephone Encounter (Signed)
 Spoke w/Kaitlyn and she informed me that they received the orders this morning.

## 2023-12-16 ENCOUNTER — Ambulatory Visit

## 2023-12-16 VITALS — Ht 60.0 in | Wt 105.0 lb

## 2023-12-16 DIAGNOSIS — S52501D Unspecified fracture of the lower end of right radius, subsequent encounter for closed fracture with routine healing: Secondary | ICD-10-CM | POA: Diagnosis not present

## 2023-12-16 DIAGNOSIS — Z9181 History of falling: Secondary | ICD-10-CM | POA: Diagnosis not present

## 2023-12-16 DIAGNOSIS — Z792 Long term (current) use of antibiotics: Secondary | ICD-10-CM | POA: Diagnosis not present

## 2023-12-16 DIAGNOSIS — H9193 Unspecified hearing loss, bilateral: Secondary | ICD-10-CM | POA: Diagnosis not present

## 2023-12-16 DIAGNOSIS — I10 Essential (primary) hypertension: Secondary | ICD-10-CM | POA: Diagnosis not present

## 2023-12-16 DIAGNOSIS — R413 Other amnesia: Secondary | ICD-10-CM | POA: Diagnosis not present

## 2023-12-16 DIAGNOSIS — Z0000 Encounter for general adult medical examination without abnormal findings: Secondary | ICD-10-CM

## 2023-12-16 DIAGNOSIS — Z8673 Personal history of transient ischemic attack (TIA), and cerebral infarction without residual deficits: Secondary | ICD-10-CM | POA: Diagnosis not present

## 2023-12-16 DIAGNOSIS — E876 Hypokalemia: Secondary | ICD-10-CM | POA: Diagnosis not present

## 2023-12-16 DIAGNOSIS — E039 Hypothyroidism, unspecified: Secondary | ICD-10-CM | POA: Diagnosis not present

## 2023-12-16 NOTE — Progress Notes (Signed)
 Subjective:   Tina Graham is a 89 y.o. female who presents for Medicare Annual (Subsequent) preventive examination.  Visit Complete: Virtual I connected with  MIDA HLINKA on 12/16/23 by a audio enabled telemedicine application and verified that I am speaking with the correct person using two identifiers.  Patient Location: Home  Provider Location: Office/Clinic  I discussed the limitations of evaluation and management by telemedicine. The patient expressed understanding and agreed to proceed.  Vital Signs: Because this visit was a virtual/telehealth visit, some criteria may be missing or patient reported. Any vitals not documented were not able to be obtained and vitals that have been documented are patient reported.  Patient Medicare AWV questionnaire was completed by the patient on n/a; I have confirmed that all information answered by patient is correct and no changes since this date.  Cardiac Risk Factors include: advanced age (>46men, >40 women);hypertension;dyslipidemia;smoking/ tobacco exposure;sedentary lifestyle     Objective:    Today's Vitals   12/16/23 1551  Weight: 105 lb (47.6 kg)  Height: 5' (1.524 m)   Body mass index is 20.51 kg/m.     12/16/2023    4:09 PM 11/25/2022    8:24 AM 11/21/2021    9:57 AM 11/06/2020   10:08 AM 03/20/2019   10:09 AM  Advanced Directives  Does Patient Have a Medical Advance Directive? Yes Yes No Yes Yes  Type of Advance Directive Living will;Healthcare Power of Attorney Living will  Living will;Healthcare Power of [employer] Power of [place];Living will  Does patient want to make changes to medical advance directive? No - Patient declined No - Patient declined  No - Patient declined No - Patient declined  Copy of Healthcare Power of Attorney in Chart? No - copy requested   No - copy requested No - copy requested  Would patient like information on creating a medical advance directive?   No - Patient declined       Current Medications (verified) Outpatient Encounter Medications as of 12/16/2023  Medication Sig   alendronate  (FOSAMAX ) 70 MG tablet Take 1 tablet (70 mg total) by mouth every 7 (seven) days. Take with a full glass of water on an empty stomach.   amLODipine -olmesartan  (AZOR ) 10-40 MG tablet TAKE 1 TABLET EVERY DAY   aspirin EC 81 MG tablet Take 81 mg by mouth daily. Swallow whole.   atorvastatin  (LIPITOR) 40 MG tablet TAKE 1 TABLET AT BEDTIME   CALCIUM -VITAMIN D  PO Take 1 tablet by mouth 2 (two) times daily.   carvedilol  (COREG ) 25 MG tablet    digoxin  (LANOXIN ) 0.125 MG tablet Take by mouth.   levothyroxine  (SYNTHROID ) 112 MCG tablet TAKE 1 TABLET EVERY DAY AND TAKE 1 AND 1/2 TABLETS ON SATURDAYS (NEW DIRECTIONS)   Multiple Vitamins-Minerals (PRESERVISION AREDS 2 PO) Take by mouth.   Omega-3 Fatty Acids (FISH OIL) 1000 MG CAPS Take 1,000 mg by mouth daily.   rivastigmine  (EXELON ) 3 MG capsule Take 1 capsule (3 mg total) by mouth 2 (two) times daily.   Selenium (SELENIMIN PO) Take by mouth.   UNABLE TO FIND Med Name: All Day/Energy Greens   lamoTRIgine (LAMICTAL) 25 MG tablet Take 50 mg by mouth 2 (two) times daily.  (Patient not taking: Reported on 12/16/2023)   UNABLE TO FIND Med Name: Vitamin O (Patient not taking: Reported on 12/16/2023)   No facility-administered encounter medications on file as of 12/16/2023.    Allergies (verified) Donepezil, Ibuprofen, Memantine, and Simvastatin   History: Past Medical History:  Diagnosis Date   H. pylori infection    treated   HOH (hard of hearing)    Hyperlipidemia    Hypertension    Hypothyroidism    Past Surgical History:  Procedure Laterality Date   APPENDECTOMY     CHOLECYSTECTOMY     THYROIDECTOMY     partial for goiter   Family History  Problem Relation Age of Onset   Cancer Daughter    Heart disease Sister        unknown specifics   Social History   Socioeconomic History   Marital status: Widowed    Spouse name:  Not on file   Number of children: 5   Years of education: 6   Highest education level: GED or equivalent  Occupational History    Employer: RETIRED  Tobacco Use   Smoking status: Former   Smokeless tobacco: Never  Advertising account planner   Vaping status: Never Used  Substance and Sexual Activity   Alcohol use: No   Drug use: No   Sexual activity: Not Currently  Other Topics Concern   Not on file  Social History Narrative   She lives alone. Her grandson lives in [place] and checks in on her two weekly. She had five children but three have passed away. Watches tv in her free time.   Social Drivers of Corporate investment banker Strain: Low Risk  (12/16/2023)   Overall Financial Resource Strain (CARDIA)    Difficulty of Paying Living Expenses: Not hard at all  Food Insecurity: No Food Insecurity (12/16/2023)   Hunger Vital Sign    Worried About Running Out of Food in the Last Year: Never true    Ran Out of Food in the Last Year: Never true  Transportation Needs: No Transportation Needs (12/16/2023)   PRAPARE - Administrator, Civil Service (Medical): No    Lack of Transportation (Non-Medical): No  Physical Activity: Inactive (12/16/2023)   Exercise Vital Sign    Days of Exercise per Week: 0 days    Minutes of Exercise per Session: 0 min  Stress: No Stress Concern Present (12/16/2023)   [employer] of Occupational Health - Occupational Stress Questionnaire    Feeling of Stress : Not at all  Social Connections: Socially Isolated (12/16/2023)   Social Connection and Isolation Panel [NHANES]    Frequency of Communication with Friends and Family: More than three times a week    Frequency of Social Gatherings with Friends and Family: More than three times a week    Attends Religious Services: Never    Database administrator or Organizations: No    Attends Banker Meetings: Never    Marital Status: Widowed    Tobacco Counseling Counseling given: Not  Answered   Clinical Intake:  Pre-visit preparation completed: Yes  Pain : No/denies pain     BMI - recorded: 20.51 Nutritional Status: BMI of 19-24  Normal Nutritional Risks: None Diabetes: No  What is the last grade level you completed in school?: 12  Interpreter Needed?: No      Activities of Daily Living    12/16/2023    3:57 PM  In your present state of health, do you have any difficulty performing the following activities:  Hearing? 1  Comment hearing aid  Vision? 0  Difficulty concentrating or making decisions? 1  Walking or climbing stairs? 1  Dressing or bathing? 0  Doing errands, shopping? 0  Preparing Food and eating ? N  Using the Toilet? N  In the past six months, have you accidently leaked urine? Y  Do you have problems with loss of bowel control? N  Managing your Medications? N  Managing your Finances? N  Housekeeping or managing your Housekeeping? N    Patient Care Team: Cydney Draft, MD as PCP - General (Family Medicine) Myrtie Atkinson, MD as Referring Physician (Rheumatology)  Indicate any recent Medical Services you may have received from other than [facility] providers in the past year (date may be approximate).     Assessment:   This is a routine wellness examination for Chardon.  Hearing/Vision screen No results found.   Goals Addressed             This Visit's Progress    Patient Stated       Patient stated she would like to walk better.       Depression Screen    12/16/2023    4:07 PM 11/17/2023   11:10 AM 11/25/2022    8:25 AM 09/21/2022    9:21 AM 11/21/2021    9:58 AM 05/05/2021   11:26 AM 11/06/2020   10:11 AM  PHQ 2/9 Scores  PHQ - 2 Score 0 0 0 0 0 0 0    Fall Risk    12/16/2023    4:09 PM 11/17/2023   11:10 AM 11/25/2022    8:25 AM 09/21/2022    9:21 AM 11/21/2021    9:58 AM  Fall Risk   Falls in the past year? 1 1 1 1  0  Number falls in past yr: 1 1 0 1 0  Injury with Fall? 1 1 1  0 0  Risk for fall due  to : Impaired mobility History of fall(s) History of fall(s);Impaired mobility History of fall(s) No Fall Risks  Follow up Falls evaluation completed Falls evaluation completed Falls evaluation completed;Education provided;Falls prevention discussed Falls evaluation completed Falls evaluation completed    MEDICARE RISK AT HOME: Medicare Risk at Home Any stairs in or around the home?: Yes If so, are there any without handrails?: Yes Home free of loose throw rugs in walkways, pet beds, electrical cords, etc?: Yes Adequate lighting in your home to reduce risk of falls?: Yes Life alert?: Yes Use of a cane, walker or w/c?: Yes Grab bars in the bathroom?: Yes Shower chair or bench in shower?: Yes Elevated toilet seat or a handicapped toilet?: Yes  TIMED UP AND GO:  Was the test performed?  No    Cognitive Function:    08/09/2017    1:33 PM 02/04/2017   10:56 AM  MMSE - Mini Mental State Exam  Orientation to time 5 5  Orientation to Place 4 5  Registration 3 3  Attention/ Calculation 5 5  Recall 3 2  Language- name 2 objects 2 2  Language- repeat 0 1  Language- follow 3 step command 3 3  Language- read & follow direction 1 1  Write a sentence 1 1  Copy design 0 0  Total score 27 28      04/08/2023    1:31 PM  Montreal Cognitive Assessment   Visuospatial/ Executive (0/5) 4  Naming (0/3) 2  Attention: Read list of digits (0/2) 1  Attention: Read list of letters (0/1) 1  Attention: Serial 7 subtraction starting at 100 (0/3) 0  Language: Repeat phrase (0/2) 2  Language : Fluency (0/1) 0  Abstraction (0/2) 1  Delayed Recall (0/5) 0  Orientation (0/6) 6  Total  17      12/16/2023    4:13 PM 11/25/2022    8:32 AM 11/21/2021   10:08 AM 11/06/2020   10:18 AM 03/20/2019   10:14 AM  6CIT Screen  What Year? 0 points 0 points 0 points 0 points 0 points  What month? 0 points 0 points 0 points 0 points 0 points  What time? 0 points 0 points 0 points 0 points 0 points  Count back from  20 0 points 0 points 0 points 0 points 0 points  Months in reverse 0 points 0 points 0 points 2 points 0 points  Repeat phrase 0 points 0 points 0 points 2 points   Total Score 0 points 0 points 0 points 4 points     Immunizations Immunization History  Administered Date(s) Administered   Fluad Quad(high Dose 65+) 05/05/2021   Fluad Trivalent(High Dose 65+) 04/08/2023   Fluzone Influenza virus vaccine,trivalent (IIV3), split virus 05/11/2007   Influenza Split 04/22/2011   Influenza Whole 04/30/2008, 07/08/2009, 07/08/2010   Influenza, High Dose Seasonal PF 06/15/2016, 06/30/2017, 05/27/2018   Influenza,inj,Quad PF,6+ Mos 06/08/2013, 05/06/2015   Influenza-Unspecified 06/08/2014, 05/16/2019, 05/25/2020   Janssen (J&J) SARS-COV-2 Vaccination 11/04/2019   Moderna SARS-COV2 Booster Vaccination 07/31/2020   Pneumococcal Conjugate-13 02/04/2017   Pneumococcal Polysaccharide-23 08/03/2005   Pneumococcal-Unspecified 08/03/2005   Td 04/17/2005   Tdap 06/28/2016, 11/08/2023    TDAP status: Up to date  Flu Vaccine status: Up to date  Pneumococcal vaccine status: Up to date  Covid-19 vaccine status: Declined, Education has been provided regarding the importance of this vaccine but patient still declined. Advised may receive this vaccine at local pharmacy or Health Dept.or vaccine clinic. Aware to provide a copy of the vaccination record if obtained from local pharmacy or Health Dept. Verbalized acceptance and understanding.  Qualifies for Shingles Vaccine? Yes   Zostavax completed No   Shingrix Completed?: No.    Education has been provided regarding the importance of this vaccine. Patient has been advised to call insurance company to determine out of pocket expense if they have not yet received this vaccine. Advised may also receive vaccine at local pharmacy or Health Dept. Verbalized acceptance and understanding.  Screening Tests Health Maintenance  Topic Date Due   Zoster Vaccines-  Shingrix (1 of 2) Never done   COVID-19 Vaccine (2 - Janssen risk series) 08/28/2020   INFLUENZA VACCINE  03/03/2024   Medicare Annual Wellness (AWV)  12/15/2024   DTaP/Tdap/Td (4 - Td or Tdap) 11/07/2033   Pneumonia Vaccine 70+ Years old  Completed   DEXA SCAN  Completed   HPV VACCINES  Aged Out   Meningococcal B Vaccine  Aged Out    Health Maintenance  Health Maintenance Due  Topic Date Due   Zoster Vaccines- Shingrix (1 of 2) Never done   COVID-19 Vaccine (2 - Janssen risk series) 08/28/2020    Colorectal cancer screening: No longer required.   Mammogram status: No longer required due to age.  Bone Density status: Completed 11/18/2023. Results reflect: Bone density results: OSTEOPOROSIS. Repeat every 2 years.  Lung Cancer Screening: (Low Dose CT Chest recommended if Age 17-80 years, 20 pack-year currently smoking OR have quit w/in 15years.) does not qualify.   Lung Cancer Screening Referral: n/a  Additional Screening:  Hepatitis C Screening: does not qualify; Completed   Vision Screening: Recommended annual ophthalmology exams for early detection of glaucoma and other disorders of the eye. Is the patient up to date with their annual eye exam?  Yes  Who is the provider or what is the name of the office in which the patient attends annual eye exams? Patient doesn't remember If pt is not established with a provider, would they like to be referred to a provider to establish care? N/a.   Dental Screening: Recommended annual dental exams for proper oral hygiene   Community Resource Referral / Chronic Care Management: CRR required this visit?  No   CCM required this visit?  No     Plan:     I have personally reviewed and noted the following in the patient's chart:   Medical and social history Use of alcohol, tobacco or illicit drugs  Current medications and supplements including opioid prescriptions. Patient is not currently taking opioid prescriptions. Functional  ability and status Nutritional status Physical activity Advanced directives List of other physicians Hospitalizations, surgeries, and ER # 2 visits in previous 12 months Vitals Screenings to include cognitive, depression, and falls Referrals and appointments  In addition, I have reviewed and discussed with patient certain preventive protocols, quality metrics, and best practice recommendations. A written personalized care plan for preventive services as well as general preventive health recommendations were provided to patient.     Aubrey Leaf, CMA   12/16/2023   After Visit Summary: (Mail) Due to this being a telephonic visit, the after visit summary with patients personalized plan was offered to patient via mail   Nurse Notes:    SHAWNTRELL FAHIE is a 89 y.o. female patient of Metheney, Corita Diego, MD who had a Medicare Annual Wellness Visit today via telephone. Vivian is Retired and lives alone. She had 5 children but three has passed away. She reports that she is socially active and does interact with friends/family regularly. She is minimally physically active and enjoys watching television.

## 2023-12-16 NOTE — Patient Instructions (Signed)
 Tina Graham , Thank you for taking time to come for your Medicare Wellness Visit. I appreciate your ongoing commitment to your health goals. Please review the following plan we discussed and let me know if I can assist you in the future.   These are the goals we discussed:  Goals       Exercise 3x per week (30 min per time)      Would like to start back walking again. Walk at least 30 minutes a day 3 times a week .      Patient Stated (pt-stated)      11/06/2020 AWV Goal: Exercise for General Health  Patient will verbalize understanding of the benefits of increased physical activity: Exercising regularly is important. It will improve your overall fitness, flexibility, and endurance. Regular exercise also will improve your overall health. It can help you control your weight, reduce stress, and improve your bone density. Over the next year, patient will increase physical activity as tolerated with a goal of at least 150 minutes of moderate physical activity per week.  You can tell that you are exercising at a moderate intensity if your heart starts beating faster and you start breathing faster but can still hold a conversation. Moderate-intensity exercise ideas include: Walking 1 mile (1.6 km) in about 15 minutes Biking Hiking Golfing Dancing Water aerobics Patient will verbalize understanding of everyday activities that increase physical activity by providing examples like the following: Yard work, such as: Insurance underwriter Gardening Washing windows or floors Patient will be able to explain general safety guidelines for exercising:  Before you start a new exercise program, talk with your health care provider. Do not exercise so much that you hurt yourself, feel dizzy, or get very short of breath. Wear comfortable clothes and wear shoes with good support. Drink plenty of water while you exercise to  prevent dehydration or heat stroke. Work out until your breathing and your heartbeat get faster.       Patient Stated (pt-stated)      11/21/2021 AWV Goal: Exercise for General Health  Patient will verbalize understanding of the benefits of increased physical activity: Exercising regularly is important. It will improve your overall fitness, flexibility, and endurance. Regular exercise also will improve your overall health. It can help you control your weight, reduce stress, and improve your bone density. Over the next year, patient will increase physical activity as tolerated with a goal of at least 150 minutes of moderate physical activity per week.  You can tell that you are exercising at a moderate intensity if your heart starts beating faster and you start breathing faster but can still hold a conversation. Moderate-intensity exercise ideas include: Walking 1 mile (1.6 km) in about 15 minutes Biking Hiking Golfing Dancing Water aerobics Patient will verbalize understanding of everyday activities that increase physical activity by providing examples like the following: Yard work, such as: Insurance underwriter Gardening Washing windows or floors Patient will be able to explain general safety guidelines for exercising:  Before you start a new exercise program, talk with your health care provider. Do not exercise so much that you hurt yourself, feel dizzy, or get very short of breath. Wear comfortable clothes and wear shoes with good support. Drink plenty of water while you exercise to prevent dehydration or heat stroke. Work out until your breathing  and your heartbeat get faster.       Patient Stated (pt-stated)      11/25/2022 AWV Goal: Fall Prevention  Over the next year, patient will decrease their risk for falls by: Using assistive devices, such as a cane or walker, as needed Identifying fall  risks within their home and correcting them by: Removing throw rugs Adding handrails to stairs or ramps Removing clutter and keeping a clear pathway throughout the home Increasing light, especially at night Adding shower handles/bars Raising toilet seat Identifying potential personal risk factors for falls: Medication side effects Incontinence/urgency Vestibular dysfunction Hearing loss Musculoskeletal disorders Neurological disorders Orthostatic hypotension        Patient Stated      Patient stated she would like to walk better.         This is a list of the screening recommended for you and due dates:  Health Maintenance  Topic Date Due   Zoster (Shingles) Vaccine (1 of 2) Never done   COVID-19 Vaccine (2 - Janssen risk series) 08/28/2020   Flu Shot  03/03/2024   Medicare Annual Wellness Visit  12/15/2024   DTaP/Tdap/Td vaccine (4 - Td or Tdap) 11/07/2033   Pneumonia Vaccine  Completed   DEXA scan (bone density measurement)  Completed   HPV Vaccine  Aged Out   Meningitis B Vaccine  Aged Out

## 2023-12-17 DIAGNOSIS — E876 Hypokalemia: Secondary | ICD-10-CM | POA: Diagnosis not present

## 2023-12-17 DIAGNOSIS — R413 Other amnesia: Secondary | ICD-10-CM | POA: Diagnosis not present

## 2023-12-17 DIAGNOSIS — I10 Essential (primary) hypertension: Secondary | ICD-10-CM | POA: Diagnosis not present

## 2023-12-17 DIAGNOSIS — S52501D Unspecified fracture of the lower end of right radius, subsequent encounter for closed fracture with routine healing: Secondary | ICD-10-CM | POA: Diagnosis not present

## 2023-12-17 DIAGNOSIS — Z792 Long term (current) use of antibiotics: Secondary | ICD-10-CM | POA: Diagnosis not present

## 2023-12-17 DIAGNOSIS — Z8673 Personal history of transient ischemic attack (TIA), and cerebral infarction without residual deficits: Secondary | ICD-10-CM | POA: Diagnosis not present

## 2023-12-17 DIAGNOSIS — Z9181 History of falling: Secondary | ICD-10-CM | POA: Diagnosis not present

## 2023-12-17 DIAGNOSIS — H9193 Unspecified hearing loss, bilateral: Secondary | ICD-10-CM | POA: Diagnosis not present

## 2023-12-17 DIAGNOSIS — E039 Hypothyroidism, unspecified: Secondary | ICD-10-CM | POA: Diagnosis not present

## 2023-12-20 DIAGNOSIS — I10 Essential (primary) hypertension: Secondary | ICD-10-CM | POA: Diagnosis not present

## 2023-12-20 DIAGNOSIS — E039 Hypothyroidism, unspecified: Secondary | ICD-10-CM | POA: Diagnosis not present

## 2023-12-20 DIAGNOSIS — S52501D Unspecified fracture of the lower end of right radius, subsequent encounter for closed fracture with routine healing: Secondary | ICD-10-CM | POA: Diagnosis not present

## 2023-12-20 DIAGNOSIS — R413 Other amnesia: Secondary | ICD-10-CM | POA: Diagnosis not present

## 2023-12-20 DIAGNOSIS — E876 Hypokalemia: Secondary | ICD-10-CM | POA: Diagnosis not present

## 2023-12-20 DIAGNOSIS — Z8673 Personal history of transient ischemic attack (TIA), and cerebral infarction without residual deficits: Secondary | ICD-10-CM | POA: Diagnosis not present

## 2023-12-20 DIAGNOSIS — H9193 Unspecified hearing loss, bilateral: Secondary | ICD-10-CM | POA: Diagnosis not present

## 2023-12-20 DIAGNOSIS — Z9181 History of falling: Secondary | ICD-10-CM | POA: Diagnosis not present

## 2023-12-20 DIAGNOSIS — Z792 Long term (current) use of antibiotics: Secondary | ICD-10-CM | POA: Diagnosis not present

## 2023-12-23 DIAGNOSIS — M7918 Myalgia, other site: Secondary | ICD-10-CM | POA: Diagnosis not present

## 2023-12-23 DIAGNOSIS — G40209 Localization-related (focal) (partial) symptomatic epilepsy and epileptic syndromes with complex partial seizures, not intractable, without status epilepticus: Secondary | ICD-10-CM | POA: Diagnosis not present

## 2023-12-23 DIAGNOSIS — Z79899 Other long term (current) drug therapy: Secondary | ICD-10-CM | POA: Diagnosis not present

## 2023-12-23 DIAGNOSIS — M7912 Myalgia of auxiliary muscles, head and neck: Secondary | ICD-10-CM | POA: Diagnosis not present

## 2023-12-29 DIAGNOSIS — E039 Hypothyroidism, unspecified: Secondary | ICD-10-CM | POA: Diagnosis not present

## 2023-12-29 DIAGNOSIS — Z961 Presence of intraocular lens: Secondary | ICD-10-CM | POA: Diagnosis not present

## 2023-12-29 DIAGNOSIS — I10 Essential (primary) hypertension: Secondary | ICD-10-CM | POA: Diagnosis not present

## 2023-12-29 DIAGNOSIS — S52501D Unspecified fracture of the lower end of right radius, subsequent encounter for closed fracture with routine healing: Secondary | ICD-10-CM | POA: Diagnosis not present

## 2023-12-29 DIAGNOSIS — R413 Other amnesia: Secondary | ICD-10-CM | POA: Diagnosis not present

## 2023-12-29 DIAGNOSIS — H43813 Vitreous degeneration, bilateral: Secondary | ICD-10-CM | POA: Diagnosis not present

## 2023-12-29 DIAGNOSIS — Z9181 History of falling: Secondary | ICD-10-CM | POA: Diagnosis not present

## 2023-12-29 DIAGNOSIS — E876 Hypokalemia: Secondary | ICD-10-CM | POA: Diagnosis not present

## 2023-12-29 DIAGNOSIS — H9193 Unspecified hearing loss, bilateral: Secondary | ICD-10-CM | POA: Diagnosis not present

## 2023-12-29 DIAGNOSIS — Z792 Long term (current) use of antibiotics: Secondary | ICD-10-CM | POA: Diagnosis not present

## 2023-12-29 DIAGNOSIS — H353221 Exudative age-related macular degeneration, left eye, with active choroidal neovascularization: Secondary | ICD-10-CM | POA: Diagnosis not present

## 2023-12-29 DIAGNOSIS — H35033 Hypertensive retinopathy, bilateral: Secondary | ICD-10-CM | POA: Diagnosis not present

## 2023-12-29 DIAGNOSIS — Z8673 Personal history of transient ischemic attack (TIA), and cerebral infarction without residual deficits: Secondary | ICD-10-CM | POA: Diagnosis not present

## 2023-12-29 DIAGNOSIS — H353112 Nonexudative age-related macular degeneration, right eye, intermediate dry stage: Secondary | ICD-10-CM | POA: Diagnosis not present

## 2023-12-30 DIAGNOSIS — E039 Hypothyroidism, unspecified: Secondary | ICD-10-CM | POA: Diagnosis not present

## 2023-12-30 DIAGNOSIS — Z792 Long term (current) use of antibiotics: Secondary | ICD-10-CM | POA: Diagnosis not present

## 2023-12-30 DIAGNOSIS — Z9181 History of falling: Secondary | ICD-10-CM | POA: Diagnosis not present

## 2023-12-30 DIAGNOSIS — S52501D Unspecified fracture of the lower end of right radius, subsequent encounter for closed fracture with routine healing: Secondary | ICD-10-CM | POA: Diagnosis not present

## 2023-12-30 DIAGNOSIS — I10 Essential (primary) hypertension: Secondary | ICD-10-CM | POA: Diagnosis not present

## 2023-12-30 DIAGNOSIS — E876 Hypokalemia: Secondary | ICD-10-CM | POA: Diagnosis not present

## 2023-12-30 DIAGNOSIS — Z8673 Personal history of transient ischemic attack (TIA), and cerebral infarction without residual deficits: Secondary | ICD-10-CM | POA: Diagnosis not present

## 2023-12-30 DIAGNOSIS — H9193 Unspecified hearing loss, bilateral: Secondary | ICD-10-CM | POA: Diagnosis not present

## 2023-12-30 DIAGNOSIS — R413 Other amnesia: Secondary | ICD-10-CM | POA: Diagnosis not present

## 2024-01-04 DIAGNOSIS — Z792 Long term (current) use of antibiotics: Secondary | ICD-10-CM | POA: Diagnosis not present

## 2024-01-04 DIAGNOSIS — H9193 Unspecified hearing loss, bilateral: Secondary | ICD-10-CM | POA: Diagnosis not present

## 2024-01-04 DIAGNOSIS — R413 Other amnesia: Secondary | ICD-10-CM | POA: Diagnosis not present

## 2024-01-04 DIAGNOSIS — I10 Essential (primary) hypertension: Secondary | ICD-10-CM | POA: Diagnosis not present

## 2024-01-04 DIAGNOSIS — S52501D Unspecified fracture of the lower end of right radius, subsequent encounter for closed fracture with routine healing: Secondary | ICD-10-CM | POA: Diagnosis not present

## 2024-01-04 DIAGNOSIS — E039 Hypothyroidism, unspecified: Secondary | ICD-10-CM | POA: Diagnosis not present

## 2024-01-04 DIAGNOSIS — E876 Hypokalemia: Secondary | ICD-10-CM | POA: Diagnosis not present

## 2024-01-04 DIAGNOSIS — Z8673 Personal history of transient ischemic attack (TIA), and cerebral infarction without residual deficits: Secondary | ICD-10-CM | POA: Diagnosis not present

## 2024-01-04 DIAGNOSIS — Z9181 History of falling: Secondary | ICD-10-CM | POA: Diagnosis not present

## 2024-01-05 DIAGNOSIS — H9193 Unspecified hearing loss, bilateral: Secondary | ICD-10-CM | POA: Diagnosis not present

## 2024-01-05 DIAGNOSIS — E876 Hypokalemia: Secondary | ICD-10-CM | POA: Diagnosis not present

## 2024-01-05 DIAGNOSIS — Z9181 History of falling: Secondary | ICD-10-CM | POA: Diagnosis not present

## 2024-01-05 DIAGNOSIS — I10 Essential (primary) hypertension: Secondary | ICD-10-CM | POA: Diagnosis not present

## 2024-01-05 DIAGNOSIS — E039 Hypothyroidism, unspecified: Secondary | ICD-10-CM | POA: Diagnosis not present

## 2024-01-05 DIAGNOSIS — R413 Other amnesia: Secondary | ICD-10-CM | POA: Diagnosis not present

## 2024-01-05 DIAGNOSIS — S52501D Unspecified fracture of the lower end of right radius, subsequent encounter for closed fracture with routine healing: Secondary | ICD-10-CM | POA: Diagnosis not present

## 2024-01-05 DIAGNOSIS — Z792 Long term (current) use of antibiotics: Secondary | ICD-10-CM | POA: Diagnosis not present

## 2024-01-05 DIAGNOSIS — Z8673 Personal history of transient ischemic attack (TIA), and cerebral infarction without residual deficits: Secondary | ICD-10-CM | POA: Diagnosis not present

## 2024-01-10 DIAGNOSIS — I10 Essential (primary) hypertension: Secondary | ICD-10-CM | POA: Diagnosis not present

## 2024-01-10 DIAGNOSIS — E876 Hypokalemia: Secondary | ICD-10-CM | POA: Diagnosis not present

## 2024-01-10 DIAGNOSIS — Z9181 History of falling: Secondary | ICD-10-CM | POA: Diagnosis not present

## 2024-01-10 DIAGNOSIS — Z8673 Personal history of transient ischemic attack (TIA), and cerebral infarction without residual deficits: Secondary | ICD-10-CM | POA: Diagnosis not present

## 2024-01-10 DIAGNOSIS — Z792 Long term (current) use of antibiotics: Secondary | ICD-10-CM | POA: Diagnosis not present

## 2024-01-10 DIAGNOSIS — E039 Hypothyroidism, unspecified: Secondary | ICD-10-CM | POA: Diagnosis not present

## 2024-01-10 DIAGNOSIS — R413 Other amnesia: Secondary | ICD-10-CM | POA: Diagnosis not present

## 2024-01-10 DIAGNOSIS — H9193 Unspecified hearing loss, bilateral: Secondary | ICD-10-CM | POA: Diagnosis not present

## 2024-01-10 DIAGNOSIS — S52501D Unspecified fracture of the lower end of right radius, subsequent encounter for closed fracture with routine healing: Secondary | ICD-10-CM | POA: Diagnosis not present

## 2024-01-11 ENCOUNTER — Encounter: Payer: Self-pay | Admitting: Family Medicine

## 2024-01-11 ENCOUNTER — Ambulatory Visit (INDEPENDENT_AMBULATORY_CARE_PROVIDER_SITE_OTHER): Admitting: Family Medicine

## 2024-01-11 ENCOUNTER — Telehealth: Payer: Self-pay

## 2024-01-11 DIAGNOSIS — M8000XA Age-related osteoporosis with current pathological fracture, unspecified site, initial encounter for fracture: Secondary | ICD-10-CM

## 2024-01-11 MED ORDER — ALENDRONATE SODIUM 70 MG PO TABS: 70.0000 mg | ORAL_TABLET | ORAL | 0 refills | Status: DC

## 2024-01-11 NOTE — Progress Notes (Signed)
 Discussed with patient because this is a telephone visit insurance does not usually cover this and I do not want her to get a large bill she actually already has an in person follow-up scheduled for next month and since she is no longer taking the medication it is reasonable to follow-up next month.

## 2024-01-11 NOTE — Progress Notes (Signed)
 Pt stated that she didn't feel that the Rivastigmine  was helping her. She stopped taking these about 3 weeks ago.

## 2024-01-11 NOTE — Telephone Encounter (Unsigned)
 Copied from CRM [id number]. Topic: General - Other >> Jan 11, 2024 12:52 PM Dominique A wrote: Reason for CRM: Patient is calling requesting Dr. Greer Leak or Lynnie Saucier to call her back.  Patient did not want to give any information on why she would like to speak with Tonya or Dr. Greer Leak and stated just have them to call me back.  Patient did not want to provide her name or dob at the beginning of the call asked for what.

## 2024-01-12 ENCOUNTER — Telehealth: Payer: Self-pay

## 2024-01-12 DIAGNOSIS — Z8673 Personal history of transient ischemic attack (TIA), and cerebral infarction without residual deficits: Secondary | ICD-10-CM | POA: Diagnosis not present

## 2024-01-12 DIAGNOSIS — R413 Other amnesia: Secondary | ICD-10-CM | POA: Diagnosis not present

## 2024-01-12 DIAGNOSIS — Z792 Long term (current) use of antibiotics: Secondary | ICD-10-CM | POA: Diagnosis not present

## 2024-01-12 DIAGNOSIS — E039 Hypothyroidism, unspecified: Secondary | ICD-10-CM | POA: Diagnosis not present

## 2024-01-12 DIAGNOSIS — E876 Hypokalemia: Secondary | ICD-10-CM | POA: Diagnosis not present

## 2024-01-12 DIAGNOSIS — H9193 Unspecified hearing loss, bilateral: Secondary | ICD-10-CM | POA: Diagnosis not present

## 2024-01-12 DIAGNOSIS — S52501D Unspecified fracture of the lower end of right radius, subsequent encounter for closed fracture with routine healing: Secondary | ICD-10-CM | POA: Diagnosis not present

## 2024-01-12 DIAGNOSIS — Z9181 History of falling: Secondary | ICD-10-CM | POA: Diagnosis not present

## 2024-01-12 DIAGNOSIS — I10 Essential (primary) hypertension: Secondary | ICD-10-CM | POA: Diagnosis not present

## 2024-01-12 NOTE — Telephone Encounter (Signed)
 Copied from CRM [id number]. Topic: General - Other >> Jan 11, 2024 12:52 PM Dominique A wrote: Reason for CRM: Patient is calling requesting Dr. Greer Leak or Lynnie Saucier to call her back.  Patient did not want to give any information on why she would like to speak with Tonya or Dr. Greer Leak and stated just have them to call me back.  Patient did not want to provide her name or dob at the beginning of the call asked for what. >> Jan 12, 2024 11:06 AM Retta Caster wrote: Patient calling only wants to speak to office on issue. Stated she is not interested in coming in for the app and will like to speak to the office about it. Called office they transferred to LPN [place] but n/A. Needs call back  [phone]

## 2024-01-13 NOTE — Telephone Encounter (Signed)
 Called pt back she stated that she wanted to let Dr. Greer Leak know that she wasn't going to continue taking the Rivastigmine  3 mg. She stated that she didn't feel that it was doing anything to help her memory.  She also wanted to let us  know that she wanted to cancel next months appointment.

## 2024-01-13 NOTE — Telephone Encounter (Signed)
 See previous note

## 2024-01-14 DIAGNOSIS — I480 Paroxysmal atrial fibrillation: Secondary | ICD-10-CM | POA: Diagnosis not present

## 2024-01-14 DIAGNOSIS — M5412 Radiculopathy, cervical region: Secondary | ICD-10-CM | POA: Diagnosis not present

## 2024-01-14 DIAGNOSIS — I10 Essential (primary) hypertension: Secondary | ICD-10-CM | POA: Diagnosis not present

## 2024-01-17 DIAGNOSIS — S52531D Colles' fracture of right radius, subsequent encounter for closed fracture with routine healing: Secondary | ICD-10-CM | POA: Diagnosis not present

## 2024-01-17 DIAGNOSIS — S52531S Colles' fracture of right radius, sequela: Secondary | ICD-10-CM | POA: Diagnosis not present

## 2024-01-19 DIAGNOSIS — S52501D Unspecified fracture of the lower end of right radius, subsequent encounter for closed fracture with routine healing: Secondary | ICD-10-CM | POA: Diagnosis not present

## 2024-01-19 DIAGNOSIS — R413 Other amnesia: Secondary | ICD-10-CM | POA: Diagnosis not present

## 2024-01-19 DIAGNOSIS — E039 Hypothyroidism, unspecified: Secondary | ICD-10-CM | POA: Diagnosis not present

## 2024-01-19 DIAGNOSIS — M5412 Radiculopathy, cervical region: Secondary | ICD-10-CM | POA: Diagnosis not present

## 2024-01-19 DIAGNOSIS — E876 Hypokalemia: Secondary | ICD-10-CM | POA: Diagnosis not present

## 2024-01-19 DIAGNOSIS — I480 Paroxysmal atrial fibrillation: Secondary | ICD-10-CM | POA: Diagnosis not present

## 2024-01-19 DIAGNOSIS — D649 Anemia, unspecified: Secondary | ICD-10-CM | POA: Diagnosis not present

## 2024-01-19 DIAGNOSIS — E785 Hyperlipidemia, unspecified: Secondary | ICD-10-CM | POA: Diagnosis not present

## 2024-01-19 DIAGNOSIS — I10 Essential (primary) hypertension: Secondary | ICD-10-CM | POA: Diagnosis not present

## 2024-01-21 DIAGNOSIS — I4719 Other supraventricular tachycardia: Secondary | ICD-10-CM | POA: Diagnosis not present

## 2024-01-21 DIAGNOSIS — I495 Sick sinus syndrome: Secondary | ICD-10-CM | POA: Diagnosis not present

## 2024-01-21 DIAGNOSIS — I4892 Unspecified atrial flutter: Secondary | ICD-10-CM | POA: Diagnosis not present

## 2024-01-21 DIAGNOSIS — I10 Essential (primary) hypertension: Secondary | ICD-10-CM | POA: Diagnosis not present

## 2024-01-21 DIAGNOSIS — I4891 Unspecified atrial fibrillation: Secondary | ICD-10-CM | POA: Diagnosis not present

## 2024-01-21 DIAGNOSIS — I480 Paroxysmal atrial fibrillation: Secondary | ICD-10-CM | POA: Diagnosis not present

## 2024-01-25 DIAGNOSIS — E785 Hyperlipidemia, unspecified: Secondary | ICD-10-CM | POA: Diagnosis not present

## 2024-01-25 DIAGNOSIS — M5412 Radiculopathy, cervical region: Secondary | ICD-10-CM | POA: Diagnosis not present

## 2024-01-25 DIAGNOSIS — S52501D Unspecified fracture of the lower end of right radius, subsequent encounter for closed fracture with routine healing: Secondary | ICD-10-CM | POA: Diagnosis not present

## 2024-01-25 DIAGNOSIS — I10 Essential (primary) hypertension: Secondary | ICD-10-CM | POA: Diagnosis not present

## 2024-01-25 DIAGNOSIS — I480 Paroxysmal atrial fibrillation: Secondary | ICD-10-CM | POA: Diagnosis not present

## 2024-01-25 DIAGNOSIS — R413 Other amnesia: Secondary | ICD-10-CM | POA: Diagnosis not present

## 2024-01-25 DIAGNOSIS — E876 Hypokalemia: Secondary | ICD-10-CM | POA: Diagnosis not present

## 2024-01-25 DIAGNOSIS — D649 Anemia, unspecified: Secondary | ICD-10-CM | POA: Diagnosis not present

## 2024-01-25 DIAGNOSIS — E039 Hypothyroidism, unspecified: Secondary | ICD-10-CM | POA: Diagnosis not present

## 2024-01-27 DIAGNOSIS — Z5181 Encounter for therapeutic drug level monitoring: Secondary | ICD-10-CM | POA: Diagnosis not present

## 2024-01-27 DIAGNOSIS — M5412 Radiculopathy, cervical region: Secondary | ICD-10-CM | POA: Diagnosis not present

## 2024-01-27 DIAGNOSIS — M5417 Radiculopathy, lumbosacral region: Secondary | ICD-10-CM | POA: Diagnosis not present

## 2024-01-27 DIAGNOSIS — G5603 Carpal tunnel syndrome, bilateral upper limbs: Secondary | ICD-10-CM | POA: Diagnosis not present

## 2024-01-27 DIAGNOSIS — Z79899 Other long term (current) drug therapy: Secondary | ICD-10-CM | POA: Diagnosis not present

## 2024-01-27 DIAGNOSIS — G603 Idiopathic progressive neuropathy: Secondary | ICD-10-CM | POA: Diagnosis not present

## 2024-01-28 DIAGNOSIS — M5412 Radiculopathy, cervical region: Secondary | ICD-10-CM | POA: Diagnosis not present

## 2024-01-28 DIAGNOSIS — I480 Paroxysmal atrial fibrillation: Secondary | ICD-10-CM | POA: Diagnosis not present

## 2024-01-28 DIAGNOSIS — D649 Anemia, unspecified: Secondary | ICD-10-CM | POA: Diagnosis not present

## 2024-01-28 DIAGNOSIS — E876 Hypokalemia: Secondary | ICD-10-CM | POA: Diagnosis not present

## 2024-01-28 DIAGNOSIS — I10 Essential (primary) hypertension: Secondary | ICD-10-CM | POA: Diagnosis not present

## 2024-01-28 DIAGNOSIS — R413 Other amnesia: Secondary | ICD-10-CM | POA: Diagnosis not present

## 2024-01-28 DIAGNOSIS — E039 Hypothyroidism, unspecified: Secondary | ICD-10-CM | POA: Diagnosis not present

## 2024-01-28 DIAGNOSIS — E785 Hyperlipidemia, unspecified: Secondary | ICD-10-CM | POA: Diagnosis not present

## 2024-01-28 DIAGNOSIS — S52501D Unspecified fracture of the lower end of right radius, subsequent encounter for closed fracture with routine healing: Secondary | ICD-10-CM | POA: Diagnosis not present

## 2024-01-31 DIAGNOSIS — M5412 Radiculopathy, cervical region: Secondary | ICD-10-CM | POA: Diagnosis not present

## 2024-01-31 DIAGNOSIS — R413 Other amnesia: Secondary | ICD-10-CM | POA: Diagnosis not present

## 2024-01-31 DIAGNOSIS — S52501D Unspecified fracture of the lower end of right radius, subsequent encounter for closed fracture with routine healing: Secondary | ICD-10-CM | POA: Diagnosis not present

## 2024-01-31 DIAGNOSIS — E785 Hyperlipidemia, unspecified: Secondary | ICD-10-CM | POA: Diagnosis not present

## 2024-01-31 DIAGNOSIS — I480 Paroxysmal atrial fibrillation: Secondary | ICD-10-CM | POA: Diagnosis not present

## 2024-01-31 DIAGNOSIS — I10 Essential (primary) hypertension: Secondary | ICD-10-CM | POA: Diagnosis not present

## 2024-01-31 DIAGNOSIS — E876 Hypokalemia: Secondary | ICD-10-CM | POA: Diagnosis not present

## 2024-01-31 DIAGNOSIS — D649 Anemia, unspecified: Secondary | ICD-10-CM | POA: Diagnosis not present

## 2024-01-31 DIAGNOSIS — E039 Hypothyroidism, unspecified: Secondary | ICD-10-CM | POA: Diagnosis not present

## 2024-02-07 ENCOUNTER — Ambulatory Visit: Payer: Medicare HMO | Admitting: Family Medicine

## 2024-02-07 DIAGNOSIS — E876 Hypokalemia: Secondary | ICD-10-CM | POA: Diagnosis not present

## 2024-02-07 DIAGNOSIS — R413 Other amnesia: Secondary | ICD-10-CM | POA: Diagnosis not present

## 2024-02-07 DIAGNOSIS — D649 Anemia, unspecified: Secondary | ICD-10-CM | POA: Diagnosis not present

## 2024-02-07 DIAGNOSIS — S52501D Unspecified fracture of the lower end of right radius, subsequent encounter for closed fracture with routine healing: Secondary | ICD-10-CM | POA: Diagnosis not present

## 2024-02-07 DIAGNOSIS — I480 Paroxysmal atrial fibrillation: Secondary | ICD-10-CM | POA: Diagnosis not present

## 2024-02-07 DIAGNOSIS — E785 Hyperlipidemia, unspecified: Secondary | ICD-10-CM | POA: Diagnosis not present

## 2024-02-07 DIAGNOSIS — E039 Hypothyroidism, unspecified: Secondary | ICD-10-CM | POA: Diagnosis not present

## 2024-02-07 DIAGNOSIS — I10 Essential (primary) hypertension: Secondary | ICD-10-CM | POA: Diagnosis not present

## 2024-02-07 DIAGNOSIS — M5412 Radiculopathy, cervical region: Secondary | ICD-10-CM | POA: Diagnosis not present

## 2024-02-15 DIAGNOSIS — R413 Other amnesia: Secondary | ICD-10-CM | POA: Diagnosis not present

## 2024-02-15 DIAGNOSIS — D649 Anemia, unspecified: Secondary | ICD-10-CM | POA: Diagnosis not present

## 2024-02-15 DIAGNOSIS — S52501D Unspecified fracture of the lower end of right radius, subsequent encounter for closed fracture with routine healing: Secondary | ICD-10-CM | POA: Diagnosis not present

## 2024-02-15 DIAGNOSIS — E039 Hypothyroidism, unspecified: Secondary | ICD-10-CM | POA: Diagnosis not present

## 2024-02-15 DIAGNOSIS — E876 Hypokalemia: Secondary | ICD-10-CM | POA: Diagnosis not present

## 2024-02-15 DIAGNOSIS — E785 Hyperlipidemia, unspecified: Secondary | ICD-10-CM | POA: Diagnosis not present

## 2024-02-15 DIAGNOSIS — I480 Paroxysmal atrial fibrillation: Secondary | ICD-10-CM | POA: Diagnosis not present

## 2024-02-15 DIAGNOSIS — M5412 Radiculopathy, cervical region: Secondary | ICD-10-CM | POA: Diagnosis not present

## 2024-02-15 DIAGNOSIS — I10 Essential (primary) hypertension: Secondary | ICD-10-CM | POA: Diagnosis not present

## 2024-02-16 ENCOUNTER — Ambulatory Visit: Admitting: Family Medicine

## 2024-02-17 ENCOUNTER — Other Ambulatory Visit: Payer: Self-pay | Admitting: Family Medicine

## 2024-02-22 DIAGNOSIS — I480 Paroxysmal atrial fibrillation: Secondary | ICD-10-CM | POA: Diagnosis not present

## 2024-02-22 DIAGNOSIS — I10 Essential (primary) hypertension: Secondary | ICD-10-CM | POA: Diagnosis not present

## 2024-02-22 DIAGNOSIS — M5412 Radiculopathy, cervical region: Secondary | ICD-10-CM | POA: Diagnosis not present

## 2024-02-22 DIAGNOSIS — E785 Hyperlipidemia, unspecified: Secondary | ICD-10-CM | POA: Diagnosis not present

## 2024-02-22 DIAGNOSIS — E876 Hypokalemia: Secondary | ICD-10-CM | POA: Diagnosis not present

## 2024-02-22 DIAGNOSIS — R413 Other amnesia: Secondary | ICD-10-CM | POA: Diagnosis not present

## 2024-02-22 DIAGNOSIS — E039 Hypothyroidism, unspecified: Secondary | ICD-10-CM | POA: Diagnosis not present

## 2024-02-22 DIAGNOSIS — S52501D Unspecified fracture of the lower end of right radius, subsequent encounter for closed fracture with routine healing: Secondary | ICD-10-CM | POA: Diagnosis not present

## 2024-02-22 DIAGNOSIS — D649 Anemia, unspecified: Secondary | ICD-10-CM | POA: Diagnosis not present

## 2024-03-02 DIAGNOSIS — E876 Hypokalemia: Secondary | ICD-10-CM | POA: Diagnosis not present

## 2024-03-02 DIAGNOSIS — D649 Anemia, unspecified: Secondary | ICD-10-CM | POA: Diagnosis not present

## 2024-03-02 DIAGNOSIS — E785 Hyperlipidemia, unspecified: Secondary | ICD-10-CM | POA: Diagnosis not present

## 2024-03-02 DIAGNOSIS — M5412 Radiculopathy, cervical region: Secondary | ICD-10-CM | POA: Diagnosis not present

## 2024-03-02 DIAGNOSIS — I480 Paroxysmal atrial fibrillation: Secondary | ICD-10-CM | POA: Diagnosis not present

## 2024-03-02 DIAGNOSIS — R413 Other amnesia: Secondary | ICD-10-CM | POA: Diagnosis not present

## 2024-03-02 DIAGNOSIS — I10 Essential (primary) hypertension: Secondary | ICD-10-CM | POA: Diagnosis not present

## 2024-03-02 DIAGNOSIS — S52501D Unspecified fracture of the lower end of right radius, subsequent encounter for closed fracture with routine healing: Secondary | ICD-10-CM | POA: Diagnosis not present

## 2024-03-02 DIAGNOSIS — E039 Hypothyroidism, unspecified: Secondary | ICD-10-CM | POA: Diagnosis not present

## 2024-03-06 ENCOUNTER — Telehealth: Payer: Self-pay | Admitting: Family Medicine

## 2024-03-06 NOTE — Telephone Encounter (Unsigned)
 Copied from CRM [id number]. Topic: General - Other >> Mar 06, 2024 12:20 PM Mercer PEDLAR wrote: Reason for CRM: Tina Graham calling from [facility] to confirm that we received physician order which was faxed on 03/03/24.  order number: [id number]  Callback: [phone]

## 2024-03-11 ENCOUNTER — Other Ambulatory Visit: Payer: Self-pay | Admitting: Family Medicine

## 2024-03-11 DIAGNOSIS — E039 Hypothyroidism, unspecified: Secondary | ICD-10-CM

## 2024-03-13 DIAGNOSIS — R413 Other amnesia: Secondary | ICD-10-CM | POA: Diagnosis not present

## 2024-03-13 DIAGNOSIS — I10 Essential (primary) hypertension: Secondary | ICD-10-CM | POA: Diagnosis not present

## 2024-03-13 DIAGNOSIS — M5412 Radiculopathy, cervical region: Secondary | ICD-10-CM | POA: Diagnosis not present

## 2024-03-13 DIAGNOSIS — I480 Paroxysmal atrial fibrillation: Secondary | ICD-10-CM | POA: Diagnosis not present

## 2024-03-13 DIAGNOSIS — D649 Anemia, unspecified: Secondary | ICD-10-CM | POA: Diagnosis not present

## 2024-03-13 DIAGNOSIS — E785 Hyperlipidemia, unspecified: Secondary | ICD-10-CM | POA: Diagnosis not present

## 2024-03-13 DIAGNOSIS — E039 Hypothyroidism, unspecified: Secondary | ICD-10-CM | POA: Diagnosis not present

## 2024-03-13 DIAGNOSIS — E876 Hypokalemia: Secondary | ICD-10-CM | POA: Diagnosis not present

## 2024-03-13 DIAGNOSIS — S52501D Unspecified fracture of the lower end of right radius, subsequent encounter for closed fracture with routine healing: Secondary | ICD-10-CM | POA: Diagnosis not present

## 2024-03-16 DIAGNOSIS — M7918 Myalgia, other site: Secondary | ICD-10-CM | POA: Diagnosis not present

## 2024-03-16 DIAGNOSIS — M7912 Myalgia of auxiliary muscles, head and neck: Secondary | ICD-10-CM | POA: Diagnosis not present

## 2024-03-16 DIAGNOSIS — G40209 Localization-related (focal) (partial) symptomatic epilepsy and epileptic syndromes with complex partial seizures, not intractable, without status epilepticus: Secondary | ICD-10-CM | POA: Diagnosis not present

## 2024-03-16 DIAGNOSIS — G3184 Mild cognitive impairment, so stated: Secondary | ICD-10-CM | POA: Diagnosis not present

## 2024-03-16 DIAGNOSIS — G5603 Carpal tunnel syndrome, bilateral upper limbs: Secondary | ICD-10-CM | POA: Diagnosis not present

## 2024-03-22 DIAGNOSIS — H353221 Exudative age-related macular degeneration, left eye, with active choroidal neovascularization: Secondary | ICD-10-CM | POA: Diagnosis not present

## 2024-03-24 NOTE — Telephone Encounter (Signed)
 Showing under media tab as signed

## 2024-04-04 ENCOUNTER — Other Ambulatory Visit: Payer: Self-pay | Admitting: Family Medicine

## 2024-04-04 DIAGNOSIS — M8000XA Age-related osteoporosis with current pathological fracture, unspecified site, initial encounter for fracture: Secondary | ICD-10-CM

## 2024-04-18 ENCOUNTER — Telehealth: Payer: Self-pay

## 2024-04-18 DIAGNOSIS — M8000XA Age-related osteoporosis with current pathological fracture, unspecified site, initial encounter for fracture: Secondary | ICD-10-CM

## 2024-04-18 MED ORDER — ALENDRONATE SODIUM 70 MG PO TABS: ORAL_TABLET | ORAL | 3 refills | Status: AC

## 2024-04-18 NOTE — Telephone Encounter (Signed)
Attempted call to patient . Phone rang without answer. Could not leave a voice mail message.

## 2024-04-18 NOTE — Addendum Note (Signed)
 Addended by: Yeraldin Litzenberger D on: 04/18/2024 12:28 PM   Modules accepted: Orders

## 2024-04-18 NOTE — Telephone Encounter (Signed)
 Copied from CRM [id number]. Topic: Clinical - Medication Question >> Apr 18, 2024  9:51 AM Alfonso ORN wrote: Reason for CRM: patient has question regards to the alendronate  (FOSAMAX ) 70 MG tablet , want to know if should continue taking the medication and how long  reason the bottle of the medication do not indicate any refills Patient has 2 pills left she suppose to take 1 a week

## 2024-04-18 NOTE — Telephone Encounter (Signed)
  Last written 0908/2025 Called  patient and informed that prescription was sent on 04/10/2024 to [facility].  Patient will call [facility] to check on this

## 2024-04-18 NOTE — Telephone Encounter (Signed)
 We usually recommend taking it for a minimum of 5 years it looks like we started it about a year ago.  Did send refills to [facility] today.

## 2024-04-20 DIAGNOSIS — I4891 Unspecified atrial fibrillation: Secondary | ICD-10-CM | POA: Diagnosis not present

## 2024-04-20 DIAGNOSIS — Z950 Presence of cardiac pacemaker: Secondary | ICD-10-CM | POA: Diagnosis not present

## 2024-04-20 DIAGNOSIS — I4892 Unspecified atrial flutter: Secondary | ICD-10-CM | POA: Diagnosis not present

## 2024-04-26 NOTE — Telephone Encounter (Signed)
 Attempted call to patient. Left a voice mail message requesting a return call.

## 2024-05-04 NOTE — Telephone Encounter (Signed)
 Spoke with patient - was asking how she would know the medication is working  and told her that her next  bone density will show improvement  wanting to know when next Bone density will be due again? She states she actually has only been taking the medication for 3 months so far.

## 2024-05-08 NOTE — Telephone Encounter (Signed)
 Patient informed.

## 2024-05-10 DIAGNOSIS — Z1330 Encounter for screening examination for mental health and behavioral disorders, unspecified: Secondary | ICD-10-CM | POA: Diagnosis not present

## 2024-05-10 DIAGNOSIS — Z950 Presence of cardiac pacemaker: Secondary | ICD-10-CM | POA: Diagnosis not present

## 2024-05-10 DIAGNOSIS — I480 Paroxysmal atrial fibrillation: Secondary | ICD-10-CM | POA: Diagnosis not present

## 2024-05-10 DIAGNOSIS — I495 Sick sinus syndrome: Secondary | ICD-10-CM | POA: Diagnosis not present

## 2024-05-17 DIAGNOSIS — D6869 Other thrombophilia: Secondary | ICD-10-CM | POA: Diagnosis not present

## 2024-05-17 DIAGNOSIS — G40909 Epilepsy, unspecified, not intractable, without status epilepticus: Secondary | ICD-10-CM | POA: Diagnosis not present

## 2024-05-17 DIAGNOSIS — I739 Peripheral vascular disease, unspecified: Secondary | ICD-10-CM | POA: Diagnosis not present

## 2024-05-17 DIAGNOSIS — I495 Sick sinus syndrome: Secondary | ICD-10-CM | POA: Diagnosis not present

## 2024-05-17 DIAGNOSIS — H353290 Exudative age-related macular degeneration, unspecified eye, stage unspecified: Secondary | ICD-10-CM | POA: Diagnosis not present

## 2024-05-17 DIAGNOSIS — N3941 Urge incontinence: Secondary | ICD-10-CM | POA: Diagnosis not present

## 2024-05-17 DIAGNOSIS — I110 Hypertensive heart disease with heart failure: Secondary | ICD-10-CM | POA: Diagnosis not present

## 2024-05-17 DIAGNOSIS — Z9181 History of falling: Secondary | ICD-10-CM | POA: Diagnosis not present

## 2024-05-17 DIAGNOSIS — I4891 Unspecified atrial fibrillation: Secondary | ICD-10-CM | POA: Diagnosis not present

## 2024-05-17 DIAGNOSIS — E890 Postprocedural hypothyroidism: Secondary | ICD-10-CM | POA: Diagnosis not present

## 2024-05-17 DIAGNOSIS — F02A4 Dementia in other diseases classified elsewhere, mild, with anxiety: Secondary | ICD-10-CM | POA: Diagnosis not present

## 2024-05-17 DIAGNOSIS — M810 Age-related osteoporosis without current pathological fracture: Secondary | ICD-10-CM | POA: Diagnosis not present

## 2024-05-17 DIAGNOSIS — I509 Heart failure, unspecified: Secondary | ICD-10-CM | POA: Diagnosis not present

## 2024-05-17 DIAGNOSIS — R269 Unspecified abnormalities of gait and mobility: Secondary | ICD-10-CM | POA: Diagnosis not present

## 2024-05-17 DIAGNOSIS — H269 Unspecified cataract: Secondary | ICD-10-CM | POA: Diagnosis not present

## 2024-05-17 DIAGNOSIS — Z96649 Presence of unspecified artificial hip joint: Secondary | ICD-10-CM | POA: Diagnosis not present

## 2024-05-17 DIAGNOSIS — I4892 Unspecified atrial flutter: Secondary | ICD-10-CM | POA: Diagnosis not present

## 2024-05-17 DIAGNOSIS — H9193 Unspecified hearing loss, bilateral: Secondary | ICD-10-CM | POA: Diagnosis not present

## 2024-05-17 DIAGNOSIS — F411 Generalized anxiety disorder: Secondary | ICD-10-CM | POA: Diagnosis not present

## 2024-05-17 DIAGNOSIS — G309 Alzheimer's disease, unspecified: Secondary | ICD-10-CM | POA: Diagnosis not present

## 2024-05-18 DIAGNOSIS — M542 Cervicalgia: Secondary | ICD-10-CM | POA: Diagnosis not present

## 2024-05-18 DIAGNOSIS — M7918 Myalgia, other site: Secondary | ICD-10-CM | POA: Diagnosis not present

## 2024-05-18 DIAGNOSIS — G40209 Localization-related (focal) (partial) symptomatic epilepsy and epileptic syndromes with complex partial seizures, not intractable, without status epilepticus: Secondary | ICD-10-CM | POA: Diagnosis not present

## 2024-05-18 DIAGNOSIS — M7912 Myalgia of auxiliary muscles, head and neck: Secondary | ICD-10-CM | POA: Diagnosis not present

## 2024-05-18 DIAGNOSIS — G3184 Mild cognitive impairment, so stated: Secondary | ICD-10-CM | POA: Diagnosis not present

## 2024-05-18 DIAGNOSIS — G5603 Carpal tunnel syndrome, bilateral upper limbs: Secondary | ICD-10-CM | POA: Diagnosis not present

## 2024-05-25 ENCOUNTER — Other Ambulatory Visit: Payer: Self-pay | Admitting: Family Medicine

## 2024-05-25 DIAGNOSIS — E039 Hypothyroidism, unspecified: Secondary | ICD-10-CM

## 2024-06-14 DIAGNOSIS — Z961 Presence of intraocular lens: Secondary | ICD-10-CM | POA: Diagnosis not present

## 2024-06-14 DIAGNOSIS — H43813 Vitreous degeneration, bilateral: Secondary | ICD-10-CM | POA: Diagnosis not present

## 2024-06-14 DIAGNOSIS — H35033 Hypertensive retinopathy, bilateral: Secondary | ICD-10-CM | POA: Diagnosis not present

## 2024-06-14 DIAGNOSIS — H353112 Nonexudative age-related macular degeneration, right eye, intermediate dry stage: Secondary | ICD-10-CM | POA: Diagnosis not present

## 2024-06-14 DIAGNOSIS — H353221 Exudative age-related macular degeneration, left eye, with active choroidal neovascularization: Secondary | ICD-10-CM | POA: Diagnosis not present

## 2024-06-14 LAB — OPHTHALMOLOGY REPORT-SCANNED

## 2024-07-05 ENCOUNTER — Other Ambulatory Visit: Payer: Self-pay | Admitting: Family Medicine

## 2024-07-05 DIAGNOSIS — E785 Hyperlipidemia, unspecified: Secondary | ICD-10-CM

## 2024-07-05 NOTE — Telephone Encounter (Signed)
 Please call pt and advise her that she will need an appointment and labs done for refills.also ask her if she has had her Flu shot so that we can update her chart. Thanks

## 2024-07-06 NOTE — Telephone Encounter (Signed)
 Called scheduled patient and she has not had her flu shot

## 2024-07-06 NOTE — Telephone Encounter (Signed)
 Patient has not had flu shot this

## 2024-08-02 ENCOUNTER — Ambulatory Visit: Admitting: Family Medicine

## 2024-08-02 ENCOUNTER — Encounter: Payer: Self-pay | Admitting: Family Medicine

## 2024-08-02 VITALS — BP 130/53 | HR 61 | Ht 59.0 in | Wt 108.1 lb

## 2024-08-02 DIAGNOSIS — F411 Generalized anxiety disorder: Secondary | ICD-10-CM | POA: Diagnosis not present

## 2024-08-02 DIAGNOSIS — I10 Essential (primary) hypertension: Secondary | ICD-10-CM

## 2024-08-02 DIAGNOSIS — E039 Hypothyroidism, unspecified: Secondary | ICD-10-CM | POA: Diagnosis not present

## 2024-08-02 DIAGNOSIS — Z23 Encounter for immunization: Secondary | ICD-10-CM | POA: Diagnosis not present

## 2024-08-02 DIAGNOSIS — N183 Chronic kidney disease, stage 3 unspecified: Secondary | ICD-10-CM

## 2024-08-02 MED ORDER — ALPRAZOLAM 0.5 MG PO TABS: ORAL_TABLET | ORAL | 0 refills | Status: AC

## 2024-08-02 NOTE — Assessment & Plan Note (Signed)
 Due for protein check.

## 2024-08-02 NOTE — Assessment & Plan Note (Signed)
 Due to recheck TSH.

## 2024-08-02 NOTE — Assessment & Plan Note (Signed)
 Well controlled. Continue current regimen. Follow up in  6 mo

## 2024-08-02 NOTE — Progress Notes (Signed)
 "  Established Patient Office Visit  Patient ID: Tina Graham, female    DOB: 31-Jan-1929  Age: 89 y.o. MRN: [id number] PCP: Alvan Dorothyann BIRCH, MD  Chief Complaint  Patient presents with   Medical Management of Chronic Issues    Subjective:     HPI  Discussed the use of AI scribe software for clinical note transcription with the patient, who gave verbal consent to proceed.  History of Present Illness Tina Graham is a 89 year old female who presents with upper extremity pain and functional limitations following previous arm surgery.  Upper extremity pain and functional limitations - Intermittent pain in the upper extremities, particularly between the shoulder and elbow - Pain exacerbated by activities such as reaching for items in cabinets or using both hands to open containers - Soreness after activities like opening cans or lifting objects - Belief that a pin may have been placed during previous arm surgery, though uncertain of details - Performs hand and arm exercises, including stretching and making fists, to maintain mobility and reduce pain - Difficulty with housework, requiring tasks like vacuuming and mopping to be spread out over several days to avoid overexertion  Mobility and gait - Uses a walker when going out - Primarily ambulates and exercises around the house  Sleep disturbance and lower extremity discomfort - No significant leg pain - Difficulty sleeping on her side due to discomfort in the legs - Has tried using pillows for support without satisfactory relief  Blood pressure monitoring - Has not been monitoring blood pressure regularly since her mother's severe illness - No recent comments from healthcare providers indicating elevated blood pressure  Wants refill on her Xanax  that she uses sparingly. Given 20 tabs in 2024 and she is out.  She usually quarters them.     ROS    Objective:     BP (!) 130/53   Pulse 61   Ht 4' 11 (1.499 m)    Wt 108 lb 1.9 oz (49 kg)   SpO2 99%   BMI 21.84 kg/m    Physical Exam Vitals and nursing note reviewed.  Constitutional:      Appearance: Normal appearance.  HENT:     Head: Normocephalic and atraumatic.  Eyes:     Conjunctiva/sclera: Conjunctivae normal.  Cardiovascular:     Rate and Rhythm: Normal rate and regular rhythm.  Pulmonary:     Effort: Pulmonary effort is normal.     Breath sounds: Normal breath sounds.  Skin:    General: Skin is warm and dry.  Neurological:     Mental Status: She is alert.  Psychiatric:        Mood and Affect: Mood normal.      No results found for any visits on 08/02/24.    The ASCVD Risk score (Arnett DK, et al., 2019) failed to calculate for the following reasons:   The 2019 ASCVD risk score is only valid for ages 73 to 60   * - Cholesterol units were assumed    Assessment & Plan:   Problem List Items Addressed This Visit       Cardiovascular and Mediastinum   HYPERTENSION, BENIGN   Well controlled. Continue current regimen. Follow up in  14mo       Relevant Orders   CMP14+EGFR   Lipid panel   CBC     Endocrine   Hypothyroidism   Due to recheck TSH      Relevant Orders   CMP14+EGFR  Lipid panel   CBC     Genitourinary   CKD (chronic kidney disease) stage 3, GFR 30-59 ml/min (HCC)   Due for protein check.       Relevant Orders   Urine Microalbumin w/creat. ratio   CMP14+EGFR   Lipid panel   CBC     Other   Generalized anxiety disorder   Relevant Medications   ALPRAZolam  (XANAX ) 0.5 MG tablet   Other Visit Diagnoses       Encounter for immunization    -  Primary   Relevant Orders   Flu vaccine HIGH DOSE PF(Fluzone Trivalent) (Completed)       Assessment and Plan Assessment & Plan Upper extremity pain and muscle weakness Pain likely due to overuse and lack of stretching. - Recommended daily stretching exercises for upper extremities. - Advised using a walker for stability instead of a cane. -  Encouraged breaking up housework into smaller tasks. - will check a CK Question PMR  Essential hypertension Blood pressure not recently monitored. - Continue to monitor blood pressure regularly.  Chronic kidney disease, stage 3 - Ordered blood work to assess kidney function.  Hypothyroidism Requires re-evaluation of thyroid  function. - Ordered blood work to assess thyroid  function.  General health maintenance Routine health maintenance discussed. - Ordered blood work to assess kidney, liver, and thyroid  function. - Arranged for blood draw in the office.    Return in about 6 months (around 01/30/2025).    Dorothyann Byars, MD [facility] at [facility]   "

## 2024-08-03 LAB — CMP14+EGFR
ALT: 22 IU/L (ref 0–32)
AST: 19 IU/L (ref 0–40)
Albumin: 4.3 g/dL (ref 3.6–4.6)
Alkaline Phosphatase: 70 IU/L (ref 48–129)
BUN/Creatinine Ratio: 26 (ref 12–28)
BUN: 27 mg/dL (ref 10–36)
Bilirubin Total: 1.2 mg/dL (ref 0.0–1.2)
CO2: 24 mmol/L (ref 20–29)
Calcium: 9.5 mg/dL (ref 8.7–10.3)
Chloride: 103 mmol/L (ref 96–106)
Creatinine, Ser: 1.05 mg/dL — ABNORMAL HIGH (ref 0.57–1.00)
Globulin, Total: 2 g/dL (ref 1.5–4.5)
Glucose: 91 mg/dL (ref 70–99)
Potassium: 4.4 mmol/L (ref 3.5–5.2)
Sodium: 141 mmol/L (ref 134–144)
Total Protein: 6.3 g/dL (ref 6.0–8.5)
eGFR: 50 mL/min/1.73 — ABNORMAL LOW

## 2024-08-03 LAB — LIPID PANEL
Chol/HDL Ratio: 1.9 ratio (ref 0.0–4.4)
Cholesterol, Total: 105 mg/dL (ref 100–199)
HDL: 55 mg/dL
LDL Chol Calc (NIH): 30 mg/dL (ref 0–99)
Triglycerides: 113 mg/dL (ref 0–149)
VLDL Cholesterol Cal: 20 mg/dL (ref 5–40)

## 2024-08-03 LAB — MICROALBUMIN / CREATININE URINE RATIO
Creatinine, Urine: 138.9 mg/dL
Microalb/Creat Ratio: 20 mg/g{creat} (ref 0–29)
Microalbumin, Urine: 28 ug/mL

## 2024-08-03 LAB — CBC
Hematocrit: 39.9 % (ref 34.0–46.6)
Hemoglobin: 12.9 g/dL (ref 11.1–15.9)
MCH: 31.2 pg (ref 26.6–33.0)
MCHC: 32.3 g/dL (ref 31.5–35.7)
MCV: 96 fL (ref 79–97)
Platelets: 228 x10E3/uL (ref 150–450)
RBC: 4.14 x10E6/uL (ref 3.77–5.28)
RDW: 12.6 % (ref 11.7–15.4)
WBC: 10.9 x10E3/uL — ABNORMAL HIGH (ref 3.4–10.8)

## 2024-08-04 ENCOUNTER — Ambulatory Visit: Payer: Self-pay | Admitting: Family Medicine

## 2024-08-04 NOTE — Progress Notes (Signed)
 Your lab work is within acceptable range and there are no concerning findings.   ?

## 2025-01-30 ENCOUNTER — Ambulatory Visit: Admitting: Family Medicine
